# Patient Record
Sex: Female | Born: 1950 | Race: Black or African American | Hispanic: No | Marital: Married | Smoking: Never smoker
Health system: Southern US, Community
[De-identification: ages and names within clinical notes are randomized; demographics above are authoritative.]

## PROBLEM LIST (undated history)

## (undated) DIAGNOSIS — R002 Palpitations: Secondary | ICD-10-CM

## (undated) DIAGNOSIS — F410 Panic disorder [episodic paroxysmal anxiety] without agoraphobia: Secondary | ICD-10-CM

## (undated) DIAGNOSIS — H409 Unspecified glaucoma: Secondary | ICD-10-CM

## (undated) DIAGNOSIS — E785 Hyperlipidemia, unspecified: Secondary | ICD-10-CM

## (undated) DIAGNOSIS — I10 Essential (primary) hypertension: Secondary | ICD-10-CM

## (undated) DIAGNOSIS — D649 Anemia, unspecified: Secondary | ICD-10-CM

## (undated) DIAGNOSIS — K219 Gastro-esophageal reflux disease without esophagitis: Secondary | ICD-10-CM

## (undated) DIAGNOSIS — E119 Type 2 diabetes mellitus without complications: Secondary | ICD-10-CM

## (undated) DIAGNOSIS — G629 Polyneuropathy, unspecified: Secondary | ICD-10-CM

## (undated) DIAGNOSIS — R51 Headache: Secondary | ICD-10-CM

## (undated) DIAGNOSIS — R7303 Prediabetes: Secondary | ICD-10-CM

## (undated) DIAGNOSIS — R519 Headache, unspecified: Secondary | ICD-10-CM

## (undated) HISTORY — DX: Prediabetes: R73.03

## (undated) HISTORY — DX: Palpitations: R00.2

---

## 2002-06-05 DIAGNOSIS — B1710 Acute hepatitis C without hepatic coma: Secondary | ICD-10-CM | POA: Insufficient documentation

## 2002-09-03 DIAGNOSIS — G589 Mononeuropathy, unspecified: Secondary | ICD-10-CM | POA: Insufficient documentation

## 2003-11-06 ENCOUNTER — Other Ambulatory Visit: Payer: Self-pay

## 2004-04-07 ENCOUNTER — Encounter: Payer: Self-pay | Admitting: Orthopedic Surgery

## 2013-10-15 ENCOUNTER — Ambulatory Visit: Payer: Self-pay

## 2013-10-31 ENCOUNTER — Ambulatory Visit: Payer: Self-pay

## 2013-11-07 ENCOUNTER — Ambulatory Visit: Payer: Self-pay | Admitting: Family Medicine

## 2014-05-29 ENCOUNTER — Ambulatory Visit: Payer: Self-pay | Admitting: Family Medicine

## 2014-10-13 ENCOUNTER — Other Ambulatory Visit: Payer: Self-pay | Admitting: Family Medicine

## 2014-10-13 DIAGNOSIS — N63 Unspecified lump in unspecified breast: Secondary | ICD-10-CM

## 2014-10-19 ENCOUNTER — Other Ambulatory Visit: Payer: Self-pay | Admitting: Family Medicine

## 2014-10-19 DIAGNOSIS — N63 Unspecified lump in unspecified breast: Secondary | ICD-10-CM

## 2014-10-30 ENCOUNTER — Other Ambulatory Visit: Payer: Self-pay

## 2014-10-30 ENCOUNTER — Ambulatory Visit: Payer: Self-pay

## 2014-11-06 ENCOUNTER — Ambulatory Visit: Payer: Self-pay

## 2014-11-06 ENCOUNTER — Ambulatory Visit
Admission: RE | Admit: 2014-11-06 | Discharge: 2014-11-06 | Disposition: A | Discharge status: Home / Self Care | Payer: Medicaid Other | Source: Ambulatory Visit | Attending: Family Medicine | Admitting: Family Medicine

## 2014-11-06 DIAGNOSIS — N63 Unspecified lump in unspecified breast: Secondary | ICD-10-CM

## 2014-11-06 DIAGNOSIS — R928 Other abnormal and inconclusive findings on diagnostic imaging of breast: Secondary | ICD-10-CM | POA: Diagnosis not present

## 2015-08-26 ENCOUNTER — Ambulatory Visit: Payer: Medicaid Other | Admitting: Podiatry

## 2015-09-28 ENCOUNTER — Other Ambulatory Visit: Payer: Self-pay | Admitting: Family Medicine

## 2015-09-28 DIAGNOSIS — Z1231 Encounter for screening mammogram for malignant neoplasm of breast: Secondary | ICD-10-CM

## 2015-10-15 ENCOUNTER — Ambulatory Visit: Payer: Medicaid Other | Attending: Family Medicine

## 2015-10-29 ENCOUNTER — Ambulatory Visit: Payer: Medicaid Other | Attending: Family Medicine

## 2015-11-18 ENCOUNTER — Ambulatory Visit
Admission: RE | Admit: 2015-11-18 | Discharge: 2015-11-18 | Disposition: A | Discharge status: Home / Self Care | Payer: Medicaid Other | Source: Ambulatory Visit | Attending: Family Medicine | Admitting: Family Medicine

## 2015-11-18 DIAGNOSIS — Z1231 Encounter for screening mammogram for malignant neoplasm of breast: Secondary | ICD-10-CM | POA: Insufficient documentation

## 2016-07-27 ENCOUNTER — Ambulatory Visit (INDEPENDENT_AMBULATORY_CARE_PROVIDER_SITE_OTHER): Payer: Medicaid Other

## 2016-07-27 ENCOUNTER — Encounter: Payer: Self-pay | Admitting: Podiatry

## 2016-07-27 ENCOUNTER — Ambulatory Visit (INDEPENDENT_AMBULATORY_CARE_PROVIDER_SITE_OTHER): Payer: Medicaid Other | Admitting: Podiatry

## 2016-07-27 VITALS — BP 116/86 | HR 67 | Resp 18

## 2016-07-27 DIAGNOSIS — R52 Pain, unspecified: Secondary | ICD-10-CM

## 2016-07-27 DIAGNOSIS — M722 Plantar fascial fibromatosis: Secondary | ICD-10-CM

## 2016-07-27 DIAGNOSIS — R234 Changes in skin texture: Secondary | ICD-10-CM

## 2016-07-27 NOTE — Progress Notes (Signed)
   Subjective:    Patient ID: Stacy Cunningham, female    DOB: [birth year], 66 y.o.   MRN: [id number]  HPI this patient presents to the office with chief complaint of a cracking and bleeding from the back of her left heel for several years. She says that the cracking is worsening and she saw a blood.  She says that it was painful last week, but it has now improved with no bloody drainage.  She also says she has pain in the bottom of her heel on occasion that almost brings her to her feet. She says there is occasional pain occurs as she walks. She requests I evaluate her heel.    Review of Systems  HENT: Positive for ear pain and sinus pressure.        Ringing in ears  Cardiovascular: Positive for leg swelling.  Gastrointestinal: Positive for abdominal pain, blood in stool and diarrhea.  Musculoskeletal: Positive for back pain.       Joint pain  Skin: Positive for color change.  Neurological: Positive for numbness and headaches.       Objective:   Physical Exam GENERAL APPEARANCE: Alert, conversant. Appropriately groomed. No acute distress.  VASCULAR: Pedal pulses are  palpable at  [facility] and PT bilateral.  Capillary refill time is immediate to all digits,  Normal temperature gradient.  NEUROLOGIC: sensation is normal to 5.07 monofilament at 5/5 sites bilateral.  Light touch is intact bilateral, Muscle strength normal.  MUSCULOSKELETAL: acceptable muscle strength, tone and stability bilateral.  Intrinsic muscluature intact bilateral.  Rectus appearance of foot and digits noted bilateral. Palpable pain at insertion plantar fascia left foot.  DERMATOLOGIC: skin color, texture, and turgor are within normal limits.  No preulcerative lesions or ulcers  are seen, no interdigital maceration noted.  No open lesions present.  . No drainage noted. Dry scaly skin posterior plantar aspect left heel.  Healed fissure with dried hematoma noted.         Assessment & Plan:  Fissure left heel.  Plantar  fascitis left heel.  IE  Xray reveal no bony pathology.  Told her to consider powersteps insoles for heel pain.  Told her to vaseline and use chapstick for fissures as needed. RTC prn.   Gardiner Barefoot DPM

## 2016-10-10 ENCOUNTER — Other Ambulatory Visit: Payer: Self-pay | Admitting: Family Medicine

## 2016-10-10 DIAGNOSIS — Z1231 Encounter for screening mammogram for malignant neoplasm of breast: Secondary | ICD-10-CM

## 2016-11-21 ENCOUNTER — Ambulatory Visit
Admission: RE | Admit: 2016-11-21 | Discharge: 2016-11-21 | Disposition: A | Discharge status: Home / Self Care | Payer: Medicaid Other | Source: Ambulatory Visit | Attending: Family Medicine | Admitting: Family Medicine

## 2016-11-21 DIAGNOSIS — Z1231 Encounter for screening mammogram for malignant neoplasm of breast: Secondary | ICD-10-CM | POA: Diagnosis present

## 2017-01-04 DIAGNOSIS — S92919A Unspecified fracture of unspecified toe(s), initial encounter for closed fracture: Secondary | ICD-10-CM | POA: Insufficient documentation

## 2017-06-29 ENCOUNTER — Telehealth: Payer: Self-pay

## 2017-06-29 NOTE — Telephone Encounter (Signed)
Gastroenterology Pre-Procedure Review  Request Date: 07/13/17 Requesting Physician: Dr. Bonna Gains  PATIENT REVIEW QUESTIONS: The patient responded to the following health history questions as indicated:    1. Are you having any GI issues? No  2. Do you have a personal history of Polyps? No  3. Do you have a family history of Colon Cancer or Polyps? Yes, sister Colon Cancer 4. Diabetes Mellitus? Yes  5. Joint replacements in the past 12 months? No  6. Major health problems in the past 3 months? No  7. Any artificial heart valves, MVP, or defibrillator? No     MEDICATIONS & ALLERGIES:    Patient reports the following regarding taking any anticoagulation/antiplatelet therapy:   Plavix, Coumadin, Eliquis, Xarelto, Lovenox, Pradaxa, Brilinta, or Effient? No  Aspirin? Yes   Patient confirms/reports the following medications:  Current Outpatient Medications  Medication Sig Dispense Refill  . ferrous sulfate 325 (65 FE) MG tablet Take 325 mg by mouth daily with breakfast.    . fexofenadine (ALLEGRA) 180 MG tablet Take 180 mg by mouth daily.    . fluticasone (FLONASE) 50 MCG/ACT nasal spray Place into both nostrils daily.    . Gabapentin (NEURONTIN PO) Take 600 mg by mouth 3 (three) times daily.     . hydrochlorothiazide (HYDRODIURIL) 25 MG tablet Take 25 mg by mouth daily.    Marland Kitchen lisinopril (PRINIVIL,ZESTRIL) 5 MG tablet Take 5 mg by mouth daily.    . metFORMIN (GLUCOPHAGE) 500 MG tablet Take 500 mg by mouth. Takes 2 in am and takes 1 in pm    . simvastatin (ZOCOR) 20 MG tablet Take 20 mg by mouth daily.     No current facility-administered medications for this visit.     Patient confirms/reports the following allergies:  Allergies  Allergen Reactions  . Sulfa Antibiotics Dermatitis  . Darvon [Propoxyphene] Hives    No orders of the defined types were placed in this encounter.   AUTHORIZATION INFORMATION Primary Insurance: 1D#: Group #:  Secondary Insurance: 1D#: Group  #:  SCHEDULE INFORMATION: Date: 07/13/17 Time: Location: [facility]

## 2017-07-02 ENCOUNTER — Other Ambulatory Visit: Payer: Self-pay

## 2017-07-02 DIAGNOSIS — Z1211 Encounter for screening for malignant neoplasm of colon: Secondary | ICD-10-CM

## 2017-07-13 ENCOUNTER — Ambulatory Visit: Payer: Medicaid Other | Admitting: Anesthesiology

## 2017-07-13 ENCOUNTER — Encounter
Admission: RE | Disposition: A | Discharge status: Home / Self Care | Payer: Self-pay | Source: Ambulatory Visit | Attending: Gastroenterology

## 2017-07-13 ENCOUNTER — Ambulatory Visit
Admission: RE | Admit: 2017-07-13 | Discharge: 2017-07-13 | Disposition: A | Discharge status: Home / Self Care | Payer: Medicaid Other | Source: Ambulatory Visit | Attending: Gastroenterology | Admitting: Gastroenterology

## 2017-07-13 ENCOUNTER — Encounter: Payer: Self-pay | Admitting: *Deleted

## 2017-07-13 DIAGNOSIS — K5730 Diverticulosis of large intestine without perforation or abscess without bleeding: Secondary | ICD-10-CM | POA: Diagnosis not present

## 2017-07-13 DIAGNOSIS — Z882 Allergy status to sulfonamides status: Secondary | ICD-10-CM | POA: Diagnosis not present

## 2017-07-13 DIAGNOSIS — Z888 Allergy status to other drugs, medicaments and biological substances status: Secondary | ICD-10-CM | POA: Insufficient documentation

## 2017-07-13 DIAGNOSIS — Z7984 Long term (current) use of oral hypoglycemic drugs: Secondary | ICD-10-CM | POA: Diagnosis not present

## 2017-07-13 DIAGNOSIS — K219 Gastro-esophageal reflux disease without esophagitis: Secondary | ICD-10-CM | POA: Diagnosis not present

## 2017-07-13 DIAGNOSIS — I10 Essential (primary) hypertension: Secondary | ICD-10-CM | POA: Insufficient documentation

## 2017-07-13 DIAGNOSIS — Z1211 Encounter for screening for malignant neoplasm of colon: Secondary | ICD-10-CM | POA: Insufficient documentation

## 2017-07-13 DIAGNOSIS — E1140 Type 2 diabetes mellitus with diabetic neuropathy, unspecified: Secondary | ICD-10-CM | POA: Insufficient documentation

## 2017-07-13 DIAGNOSIS — Z79899 Other long term (current) drug therapy: Secondary | ICD-10-CM | POA: Insufficient documentation

## 2017-07-13 HISTORY — PX: COLONOSCOPY WITH PROPOFOL: SHX5780

## 2017-07-13 HISTORY — DX: Gastro-esophageal reflux disease without esophagitis: K21.9

## 2017-07-13 HISTORY — DX: Type 2 diabetes mellitus without complications: E11.9

## 2017-07-13 HISTORY — DX: Essential (primary) hypertension: I10

## 2017-07-13 HISTORY — DX: Polyneuropathy, unspecified: G62.9

## 2017-07-13 SURGERY — COLONOSCOPY WITH PROPOFOL
Anesthesia: General

## 2017-07-13 MED ORDER — PROPOFOL 10 MG/ML IV BOLUS
INTRAVENOUS | Status: DC | PRN
  Administered 2017-07-13: 70 mg via INTRAVENOUS

## 2017-07-13 MED ORDER — PROPOFOL 500 MG/50ML IV EMUL
INTRAVENOUS | Status: DC | PRN
  Administered 2017-07-13: 150 ug/kg/min via INTRAVENOUS

## 2017-07-13 MED ORDER — PROPOFOL 500 MG/50ML IV EMUL
INTRAVENOUS | Status: AC
  Filled 2017-07-13: qty 50

## 2017-07-13 MED ORDER — LIDOCAINE HCL (CARDIAC) 20 MG/ML IV SOLN
INTRAVENOUS | Status: DC | PRN
  Administered 2017-07-13: 40 mg via INTRAVENOUS

## 2017-07-13 MED ORDER — MIDAZOLAM HCL 2 MG/2ML IJ SOLN
INTRAMUSCULAR | Status: DC | PRN
  Administered 2017-07-13: 2 mg via INTRAVENOUS

## 2017-07-13 MED ORDER — SODIUM CHLORIDE 0.9 % IV SOLN
INTRAVENOUS | Status: DC
  Administered 2017-07-13: 09:00:00 via INTRAVENOUS

## 2017-07-13 MED ORDER — LIDOCAINE HCL (PF) 2 % IJ SOLN
INTRAMUSCULAR | Status: AC
  Filled 2017-07-13: qty 10

## 2017-07-13 MED ORDER — MIDAZOLAM HCL 2 MG/2ML IJ SOLN
INTRAMUSCULAR | Status: AC
  Filled 2017-07-13: qty 2

## 2017-07-13 MED ORDER — PHENYLEPHRINE HCL 10 MG/ML IJ SOLN
INTRAMUSCULAR | Status: DC | PRN
  Administered 2017-07-13 (×2): 100 ug via INTRAVENOUS
  Administered 2017-07-13 (×3): 50 ug via INTRAVENOUS

## 2017-07-13 NOTE — H&P (Signed)
[name], MD [address], [place], [place], [place], [address] [address], [place], [place], [place], [address] Phone: [phone]  Fax: [phone]  Primary Care Physician:  [facility]   Pre-Procedure History & Physical: HPI:  Stacy Cunningham is a 67 y.o. female is here for a colonoscopy.   Past Medical History:  Diagnosis Date  . Diabetes mellitus without complication ([facility])    Borderline Diabetes  . GERD (gastroesophageal reflux disease)   . Hypertension   . Neuropathy     History reviewed. No pertinent surgical history.  Prior to Admission medications   Medication Sig Start Date End Date Taking? Authorizing Provider  ferrous sulfate 325 (65 FE) MG tablet Take 325 mg by mouth daily with breakfast.   Yes [provider]  Gabapentin (NEURONTIN PO) Take 600 mg by mouth 3 (three) times daily.    Yes [provider]  hydrochlorothiazide (HYDRODIURIL) 25 MG tablet Take 25 mg by mouth daily.   Yes [provider]  lisinopril (PRINIVIL,ZESTRIL) 5 MG tablet Take 5 mg by mouth daily.   Yes [provider]  metFORMIN (GLUCOPHAGE) 500 MG tablet Take 500 mg by mouth. Takes 2 in am and takes 1 in pm 10/21/07  Yes [provider]  simvastatin (ZOCOR) 20 MG tablet Take 20 mg by mouth daily.   Yes [provider]  fexofenadine (ALLEGRA) 180 MG tablet Take 180 mg by mouth daily.    [provider]  fluticasone (FLONASE) 50 MCG/ACT nasal spray Place into both nostrils daily.    [provider]    Allergies as of 07/02/2017 - Review Complete 06/29/2017  Allergen Reaction Noted  . Sulfa antibiotics Dermatitis 07/27/2016  . Darvon [propoxyphene] Hives 06/17/2014    Family History  Problem Relation Age of Onset  . Breast cancer Sister 47    Social History   Socioeconomic History  . Marital status: Married    Spouse name: Not on file  . Number of children: Not on file  .  Years of education: Not on file  . Highest education level: Not on file  Occupational History  . Not on file  Social Needs  . Financial resource strain: Not on file  . Food insecurity:    Worry: Not on file    Inability: Not on file  . Transportation needs:    Medical: Not on file    Non-medical: Not on file  Tobacco Use  . Smoking status: Never Smoker  . Smokeless tobacco: Never Used  Substance and Sexual Activity  . Alcohol use: Yes    Comment: occas.  . Drug use: No  . Sexual activity: Not on file  Lifestyle  . Physical activity:    Days per week: Not on file    Minutes per session: Not on file  . Stress: Not on file  Relationships  . Social connections:    Talks on phone: Not on file    Gets together: Not on file    Attends religious service: Not on file    Active member of club or organization: Not on file    Attends meetings of clubs or organizations: Not on file    Relationship status: Not on file  . Intimate partner violence:    Fear of current or ex partner: Not on file    Emotionally abused: Not on file    Physically abused: Not on file    Forced sexual activity: Not on file  Other Topics Concern  .  Not on file  Social History Narrative  . Not on file    Review of Systems: See HPI, otherwise negative ROS  Physical Exam: BP 112/78   Pulse 80   Temp (!) 96.3 F (35.7 C) (Tympanic)   Resp 16   Ht 5\' 6"  (1.676 m)   Wt 185 lb (83.9 kg)   SpO2 100%   BMI 29.86 kg/m  General:   Alert,  pleasant and cooperative in NAD Head:  Normocephalic and atraumatic. Neck:  Supple; no masses or thyromegaly. Lungs:  Clear throughout to auscultation, normal respiratory effort.    Heart:  +S1, +S2, Regular rate and rhythm, No edema. Abdomen:  Soft, nontender and nondistended. Normal bowel sounds, without guarding, and without rebound.   Neurologic:  Alert and  oriented x4;  grossly normal neurologically.  Impression/Plan: Stacy Cunningham is here for a colonoscopy to  be performed for average risk screening.  Risks, benefits, limitations, and alternatives regarding  colonoscopy have been reviewed with the patient.  Questions have been answered.  All parties agreeable.   Virgel Manifold, MD  07/13/2017, 10:03 AM

## 2017-07-13 NOTE — Anesthesia Procedure Notes (Signed)
Date/Time: 07/13/2017 10:17 AM Performed by: Doreen Salvage, CRNA Pre-anesthesia Checklist: Patient identified, Emergency Drugs available, Suction available and Patient being monitored Patient Re-evaluated:Patient Re-evaluated prior to induction Oxygen Delivery Method: Nasal cannula Induction Type: IV induction Dental Injury: Teeth and Oropharynx as per pre-operative assessment  Comments: Nasal cannula with etCO2 monitoring

## 2017-07-13 NOTE — Op Note (Signed)
[facility] Gastroenterology Patient Name: Franchelle Foskett Procedure Date: 07/13/2017 10:17 AM MRN: [id number] Account #: [id number] Date of Birth: 06-08-1950 Admit Type: Outpatient Age: 67 Room: [facility] ENDO ROOM 2 Gender: Female Note Status: Finalized Procedure:            Colonoscopy Indications:          Screening for colorectal malignant neoplasm. Pt reports                        previous colonoscopy was 9-10 yrs ago and was normal.                        Report not available. Providers:            Lennette Bihari. Bonna Gains MD, MD Referring MD:         Dyke Maes. Mancheno Revelo (Referring MD) Medicines:            Monitored Anesthesia Care Complications:        No immediate complications. Procedure:            Pre-Anesthesia Assessment:                       - Prior to the procedure, a History and Physical was                        performed, and patient medications, allergies and                        sensitivities were reviewed. The patient's tolerance of                        previous anesthesia was reviewed.                       - The risks and benefits of the procedure and the                        sedation options and risks were discussed with the                        patient. All questions were answered and informed                        consent was obtained.                       - Patient identification and proposed procedure were                        verified prior to the procedure by the physician, the                        nurse, the anesthetist and the technician. The                        procedure was verified in the pre-procedure area in the                        procedure room in the endoscopy suite.                       -  ASA Grade Assessment: II - A patient with mild                        systemic disease.                       - After reviewing the risks and benefits, the patient                        was deemed in satisfactory  condition to undergo the                        procedure.                       After obtaining informed consent, the colonoscope was                        passed under direct vision. Throughout the procedure,                        the patient's blood pressure, pulse, and oxygen                        saturations were monitored continuously. The                        Colonoscope was introduced through the anus and                        advanced to the the cecum, identified by appendiceal                        orifice and ileocecal valve. The colonoscopy was                        performed with ease. The patient tolerated the                        procedure well. The quality of the bowel preparation                        was good. Findings:      The perianal and digital rectal examinations were normal.      Multiple diverticula were found in the sigmoid colon, descending colon       and ascending colon.      The exam was otherwise without abnormality.      The rectum, sigmoid colon, descending colon, transverse colon, ascending       colon and cecum appeared normal.      The retroflexed view of the distal rectum and anal verge was normal and       showed no anal or rectal abnormalities. Impression:           - Diverticulosis in the sigmoid colon, in the                        descending colon and in the ascending colon.                       - The examination was otherwise normal.                       -  The rectum, sigmoid colon, descending colon,                        transverse colon, ascending colon and cecum are normal.                       - The distal rectum and anal verge are normal on                        retroflexion view.                       - No specimens collected. Recommendation:       - Discharge patient to home.                       - Resume previous diet.                       - Continue present medications.                       - Repeat colonoscopy in  5-10 years.                       - Return to primary care physician as previously                        scheduled.                       - The findings and recommendations were discussed with                        the patient.                       - The findings and recommendations were discussed with                        the patient's family.                       - High fiber diet. Procedure Code(s):    --- Professional ---                       D9833, Colorectal cancer screening; colonoscopy on                        individual not meeting criteria for high risk Diagnosis Code(s):    --- Professional ---                       Z12.11, Encounter for screening for malignant neoplasm                        of colon                       K57.30, Diverticulosis of large intestine without                        perforation or abscess without bleeding CPT copyright 2016 American Medical Association. All rights reserved. The codes documented  in this report are preliminary and upon coder review may  be revised to meet current compliance requirements.  [name], MD Margretta Sidle B. Bonna Gains MD, MD 07/13/2017 11:03:33 AM This report has been signed electronically. Number of Addenda: 0 Note Initiated On: 07/13/2017 10:17 AM Scope Withdrawal Time: 0 hours 18 minutes 39 seconds  Total Procedure Duration: 0 hours 35 minutes 43 seconds       [facility]

## 2017-07-13 NOTE — Anesthesia Postprocedure Evaluation (Signed)
Anesthesia Post Note  Patient: Stacy Cunningham  Procedure(s) Performed: COLONOSCOPY WITH PROPOFOL (N/A )  Patient location during evaluation: Endoscopy Anesthesia Type: General Level of consciousness: awake and alert Pain management: pain level controlled Vital Signs Assessment: post-procedure vital signs reviewed and stable Respiratory status: spontaneous breathing, nonlabored ventilation, respiratory function stable and patient connected to nasal cannula oxygen Cardiovascular status: blood pressure returned to baseline and stable Postop Assessment: no apparent nausea or vomiting Anesthetic complications: no     Last Vitals:  Vitals Value Taken Time  BP    Temp    Pulse    Resp    SpO2      Last Pain:  Vitals:   07/13/17 1105  TempSrc: Tympanic  PainSc:                  Martha Clan

## 2017-07-13 NOTE — Anesthesia Post-op Follow-up Note (Signed)
Anesthesia QCDR form completed.        

## 2017-07-13 NOTE — Anesthesia Preprocedure Evaluation (Signed)
Anesthesia Evaluation  Patient identified by MRN, date of birth, ID band Patient awake    Reviewed: Allergy & Precautions, H&P , NPO status , Patient's Chart, lab work & pertinent test results, reviewed documented beta blocker date and time   History of Anesthesia Complications Negative for: history of anesthetic complications  Airway Mallampati: I  TM Distance: >3 FB Neck ROM: full    Dental  (+) Partial Upper, Dental Advidsory Given   Pulmonary neg pulmonary ROS,           Cardiovascular Exercise Tolerance: Good hypertension, (-) angina(-) CAD, (-) Past MI, (-) Cardiac Stents and (-) CABG (-) dysrhythmias (-) Valvular Problems/Murmurs     Neuro/Psych negative neurological ROS  negative psych ROS   GI/Hepatic Neg liver ROS, GERD  ,  Endo/Other  diabetes  Renal/GU negative Renal ROS  negative genitourinary   Musculoskeletal   Abdominal   Peds  Hematology negative hematology ROS (+)   Anesthesia Other Findings Past Medical History: No date: Diabetes mellitus without complication ([facility])     Comment:  Borderline Diabetes No date: GERD (gastroesophageal reflux disease) No date: Hypertension No date: Neuropathy   Reproductive/Obstetrics negative OB ROS                             Anesthesia Physical Anesthesia Plan  ASA: II  Anesthesia Plan: General   Post-op Pain Management:    Induction: Intravenous  PONV Risk Score and Plan: 3 and Propofol infusion  Airway Management Planned: Natural Airway and Nasal Cannula  Additional Equipment:   Intra-op Plan:   Post-operative Plan:   Informed Consent: I have reviewed the patients History and Physical, chart, labs and discussed the procedure including the risks, benefits and alternatives for the proposed anesthesia with the patient or authorized representative who has indicated his/her understanding and acceptance.   Dental Advisory  Given  Plan Discussed with: Anesthesiologist, CRNA and Surgeon  Anesthesia Plan Comments:         Anesthesia Quick Evaluation

## 2017-07-13 NOTE — Transfer of Care (Signed)
Immediate Anesthesia Transfer of Care Note  Patient: Evetta Renner  Procedure(s) Performed: Procedure(s): COLONOSCOPY WITH PROPOFOL (N/A)  Patient Location: PACU and Endoscopy Unit  Anesthesia Type:General  Level of Consciousness: sedated  Airway & Oxygen Therapy: Patient Spontanous Breathing and Patient connected to nasal cannula oxygen  Post-op Assessment: Report given to RN and Post -op Vital signs reviewed and stable  Post vital signs: Reviewed and stable  Last Vitals:  Vitals:   07/13/17 1100 07/13/17 1105  BP: 95/63 95/63  Pulse: 80 67  Resp: 17 14  Temp: (!) 35.8 C (!) 36.2 C  SpO2: 112% 162%    Complications: No apparent anesthesia complications

## 2017-07-16 ENCOUNTER — Encounter: Payer: Self-pay | Admitting: Gastroenterology

## 2017-09-06 ENCOUNTER — Encounter: Payer: Self-pay | Admitting: Gastroenterology

## 2017-09-06 ENCOUNTER — Ambulatory Visit: Payer: Medicaid Other | Admitting: Gastroenterology

## 2017-09-06 ENCOUNTER — Encounter: Payer: Self-pay | Admitting: Family Medicine

## 2017-09-06 ENCOUNTER — Encounter (INDEPENDENT_AMBULATORY_CARE_PROVIDER_SITE_OTHER): Payer: Self-pay

## 2017-09-06 VITALS — BP 130/84 | HR 76 | Ht 66.0 in | Wt 190.6 lb

## 2017-09-06 DIAGNOSIS — R1013 Epigastric pain: Secondary | ICD-10-CM

## 2017-09-06 NOTE — Progress Notes (Addendum)
Stacy Cunningham [address]  [place]  [place], [place] [address]  Main: [phone]  Fax: [phone]   Gastroenterology Consultation  Referring Provider:     [name]* Primary Care Physician:  [facility] Primary Gastroenterologist:  Dr. Vonda Cunningham Reason for Consultation:     Epigastric pain        HPI:   Chief complaint: Abdominal pain  Stacy Cunningham is a 67 y.o. y/o female referred for consultation & management  by Dr. Domingo Madeira, [facility].    Reports epigastric abdominal pain that started about 2 weeks ago, intermittent, does not occur with meals, occurs randomly, dull, 6/10 today, lasts for hours, and self resolves.  She was given Protonix by her primary care provider, that she takes once daily and this has not helped her symptoms.  This is not associated with heartburn.  No radiation.  No weight loss.  No nausea or vomiting.  Patient reports history of gastric ulcer back in 1980s.  She does not recall how this was diagnosed, but thinks she went to [facility] at that time.  She reports having abdominal pain and nausea vomiting at the time and states she was found to have gastric ulcers.  Does not recall if she actually had an EGD at the time.    Reports taking ibuprofen 2-3 times a week, which she stopped about 1 to 2 weeks ago, after her primary care provider asked her to do so.  Patient underwent screening colonoscopy on March 2019, that showed diverticulosis.  Otherwise exam was normal.  Past Medical History:  Diagnosis Date  . Diabetes mellitus without complication ([place])    Borderline Diabetes  . GERD (gastroesophageal reflux disease)   . Hypertension   . Neuropathy     Past Surgical History:  Procedure Laterality Date  . COLONOSCOPY WITH PROPOFOL N/A 07/13/2017   Procedure: COLONOSCOPY WITH PROPOFOL;  Surgeon: Virgel Manifold, MD;  Location: [facility] ENDOSCOPY;  Service: Endoscopy;   Laterality: N/A;    Prior to Admission medications   Medication Sig Start Date End Date Taking? Authorizing Provider  ferrous sulfate 325 (65 FE) MG tablet Take 325 mg by mouth daily with breakfast.    [provider]  fexofenadine (ALLEGRA) 180 MG tablet Take 180 mg by mouth daily.    [provider]  fluticasone (FLONASE) 50 MCG/ACT nasal spray Place into both nostrils daily.    [provider]  Gabapentin (NEURONTIN PO) Take 600 mg by mouth 3 (three) times daily.     [provider]  GABAPENTIN PO gabapentin    [provider]  hydrochlorothiazide (HYDRODIURIL) 25 MG tablet Take 25 mg by mouth daily.    [provider]  ibuprofen (ADVIL,MOTRIN) 800 MG tablet ibuprofen 800 mg tablet  Take 1 tablet 3 times a day by oral route.    [provider]  IRON PO iron    [provider]  lisinopril (PRINIVIL,ZESTRIL) 5 MG tablet Take 5 mg by mouth daily.    [provider]  LISINOPRIL PO lisinopril    [provider]  metFORMIN (GLUCOPHAGE) 500 MG tablet Take 500 mg by mouth. Takes 2 in am and takes 1 in pm 10/21/07   [provider]  METFORMIN HCL PO metformin    [provider]  simvastatin (ZOCOR) 20 MG tablet Take 20 mg by mouth daily.    [provider]  SIMVASTATIN PO simvastatin    [provider]    Family History  Problem Relation Age of Onset  . Breast cancer Sister 63     Social History   Tobacco Use  . Smoking status: Never Smoker  . Smokeless tobacco: Never Used  Substance Use Topics  . Alcohol use: Yes    Comment: occas.  . Drug use: No    Allergies as of 09/06/2017 - Review Complete 07/13/2017  Allergen Reaction Noted  . Sulfa antibiotics Dermatitis 07/27/2016  . Darvon [propoxyphene] Hives 06/17/2014    Review of Systems:    All systems reviewed and negative except where noted in HPI.   Physical Exam:  No LMP recorded. Patient is  postmenopausal.  Vitals:   09/06/17 1421  BP: 130/84  Pulse: 76  Weight: 190 lb 9.6 oz (86.5 kg)  Height: 5\' 6"  (1.676 m)    Psych:  Alert and cooperative. Normal mood and affect. General:   Alert,  Well-developed, well-nourished, pleasant and cooperative in NAD Head:  Normocephalic and atraumatic. Eyes:  Sclera clear, no icterus.   Conjunctiva pink. Ears:  Normal auditory acuity. Nose:  No deformity, discharge, or lesions. Mouth:  No deformity or lesions,oropharynx pink & moist. Neck:  Supple; no masses or thyromegaly. Lungs:  Respirations even and unlabored.  Clear throughout to auscultation.   No wheezes, crackles, or rhonchi. No acute distress. Heart:  Regular rate and rhythm; no murmurs, clicks, rubs, or gallops. Abdomen:  Normal bowel sounds.  No bruits.  Soft, non-tender and non-distended without masses, hepatosplenomegaly or hernias noted.  No guarding or rebound tenderness.    Msk:  Symmetrical without gross deformities. Good, equal movement & strength bilaterally. Pulses:  Normal pulses noted. Extremities:  No clubbing or edema.  No cyanosis. Neurologic:  Alert and oriented x3;  grossly normal neurologically. Skin:  Intact without significant lesions or rashes. No jaundice. Lymph Nodes:  No significant cervical adenopathy. Psych:  Alert and cooperative. Normal mood and affect.   Labs: CBC No results found for: WBC, RBC, HGB, HCT, PLT, MCV, MCH, MCHC, RDW, LYMPHSABS, MONOABS, EOSABS, BASOSABS CMP  No results found for: NA, K, CL, CO2, GLUCOSE, BUN, CREATININE, CALCIUM, PROT, ALBUMIN, AST, ALT, ALKPHOS, BILITOT, GFRNONAA, GFRAA  Imaging Studies: No results found.  Assessment and Plan:   Sira Adsit is a 67 y.o. y/o female has been referred for epigastric abdominal pain for 2 weeks, unrelieved with PPI, with patient reported history of ?gastric ulcers in the 1980s with frequent ibuprofen use until a few weeks ago  Due to previous history of gastric ulcer, new  abdominal pain unrelieved with PPI, and recent NSAID use, EGD is indicated to rule out gastric ulcers and evaluate etiology of symptoms Patient to continue to avoid NSAIDs Continue PPI for now, which can be changed after procedure if needed No dysphagia present I have discussed alternative options, risks & benefits,  which include, but are not limited to, bleeding, infection, perforation,respiratory complication & drug reaction.  The patient agrees with this plan & written consent will be obtained.    Her primary care clinic note reviewed, reports history of hepatitis C.  Patient states she was initially told in the 90s about hepatitis C, but she was never treated for it.  She may have spontaneously cleared at, or it is unclear if she ever had it.  She states her primary care physician recently checked her for it and she does not have it.  We will try to obtain her liver enzymes, and hep C testing done by  her primary care provider for our records. Addendum: Hep C RNA by PCR, undetectable on April 2018 lab record obtained  Colonoscopy up-to-date  Dr Stacy Cunningham

## 2017-09-14 ENCOUNTER — Ambulatory Visit: Payer: Medicaid Other | Admitting: Certified Registered"

## 2017-09-14 ENCOUNTER — Encounter: Payer: Self-pay | Admitting: Emergency Medicine

## 2017-09-14 ENCOUNTER — Ambulatory Visit
Admission: RE | Admit: 2017-09-14 | Discharge: 2017-09-14 | Disposition: A | Discharge status: Home / Self Care | Payer: Medicaid Other | Source: Ambulatory Visit | Attending: Gastroenterology | Admitting: Gastroenterology

## 2017-09-14 ENCOUNTER — Encounter
Admission: RE | Disposition: A | Discharge status: Home / Self Care | Payer: Self-pay | Source: Ambulatory Visit | Attending: Gastroenterology

## 2017-09-14 DIAGNOSIS — Z882 Allergy status to sulfonamides status: Secondary | ICD-10-CM | POA: Insufficient documentation

## 2017-09-14 DIAGNOSIS — K2950 Unspecified chronic gastritis without bleeding: Secondary | ICD-10-CM | POA: Diagnosis not present

## 2017-09-14 DIAGNOSIS — Z7984 Long term (current) use of oral hypoglycemic drugs: Secondary | ICD-10-CM | POA: Insufficient documentation

## 2017-09-14 DIAGNOSIS — K3189 Other diseases of stomach and duodenum: Secondary | ICD-10-CM

## 2017-09-14 DIAGNOSIS — Z888 Allergy status to other drugs, medicaments and biological substances status: Secondary | ICD-10-CM | POA: Diagnosis not present

## 2017-09-14 DIAGNOSIS — R1013 Epigastric pain: Secondary | ICD-10-CM

## 2017-09-14 DIAGNOSIS — K219 Gastro-esophageal reflux disease without esophagitis: Secondary | ICD-10-CM | POA: Diagnosis not present

## 2017-09-14 DIAGNOSIS — I10 Essential (primary) hypertension: Secondary | ICD-10-CM | POA: Diagnosis not present

## 2017-09-14 DIAGNOSIS — E1140 Type 2 diabetes mellitus with diabetic neuropathy, unspecified: Secondary | ICD-10-CM | POA: Insufficient documentation

## 2017-09-14 DIAGNOSIS — Z79899 Other long term (current) drug therapy: Secondary | ICD-10-CM | POA: Insufficient documentation

## 2017-09-14 HISTORY — PX: ESOPHAGOGASTRODUODENOSCOPY (EGD) WITH PROPOFOL: SHX5813

## 2017-09-14 LAB — GLUCOSE, CAPILLARY: GLUCOSE-CAPILLARY: 107 mg/dL — AB (ref 65–99)

## 2017-09-14 SURGERY — ESOPHAGOGASTRODUODENOSCOPY (EGD) WITH PROPOFOL
Anesthesia: General

## 2017-09-14 MED ORDER — PROPOFOL 10 MG/ML IV BOLUS
INTRAVENOUS | Status: DC | PRN
  Administered 2017-09-14: 70 mg via INTRAVENOUS

## 2017-09-14 MED ORDER — SODIUM CHLORIDE 0.9 % IV SOLN: INTRAVENOUS | Status: DC

## 2017-09-14 MED ORDER — PROPOFOL 500 MG/50ML IV EMUL
INTRAVENOUS | Status: DC | PRN
Start: 2017-09-14 — End: 2017-09-14
  Administered 2017-09-14: 130 ug/kg/min via INTRAVENOUS

## 2017-09-14 MED ORDER — LIDOCAINE HCL (CARDIAC) PF 100 MG/5ML IV SOSY
PREFILLED_SYRINGE | INTRAVENOUS | Status: DC | PRN
  Administered 2017-09-14: 100 mg via INTRAVENOUS

## 2017-09-14 MED ORDER — PROPOFOL 10 MG/ML IV BOLUS
INTRAVENOUS | Status: AC
  Filled 2017-09-14: qty 20

## 2017-09-14 MED ORDER — MIDAZOLAM HCL 2 MG/2ML IJ SOLN
INTRAMUSCULAR | Status: DC | PRN
  Administered 2017-09-14: 2 mg via INTRAVENOUS

## 2017-09-14 MED ORDER — MIDAZOLAM HCL 2 MG/2ML IJ SOLN
INTRAMUSCULAR | Status: AC
  Filled 2017-09-14: qty 2

## 2017-09-14 MED ORDER — LIDOCAINE HCL (PF) 2 % IJ SOLN
INTRAMUSCULAR | Status: AC
  Filled 2017-09-14: qty 10

## 2017-09-14 MED ORDER — SODIUM CHLORIDE 0.9 % IV SOLN
INTRAVENOUS | Status: DC
  Administered 2017-09-14: 1000 mL via INTRAVENOUS

## 2017-09-14 NOTE — Anesthesia Preprocedure Evaluation (Signed)
Anesthesia Evaluation  Patient identified by MRN, date of birth, ID band Patient awake    Reviewed: Allergy & Precautions, NPO status , Patient's Chart, lab work & pertinent test results  History of Anesthesia Complications Negative for: history of anesthetic complications  Airway Mallampati: III       Dental  (+) Missing, Poor Dentition, Chipped   Pulmonary neg sleep apnea, neg COPD,           Cardiovascular hypertension, Pt. on medications (-) Past MI and (-) CHF (-) dysrhythmias (-) Valvular Problems/Murmurs     Neuro/Psych neg Seizures    GI/Hepatic GERD  Medicated,(+) Hepatitis -, C  Endo/Other  diabetes, Type 2, Oral Hypoglycemic Agents  Renal/GU negative Renal ROS     Musculoskeletal   Abdominal   Peds  Hematology   Anesthesia Other Findings   Reproductive/Obstetrics                             Anesthesia Physical Anesthesia Plan  ASA: II  Anesthesia Plan: General   Post-op Pain Management:    Induction: Intravenous  PONV Risk Score and Plan: 3 and TIVA, Propofol infusion and Treatment may vary due to age or medical condition  Airway Management Planned: Simple Face Mask  Additional Equipment:   Intra-op Plan:   Post-operative Plan:   Informed Consent: I have reviewed the patients History and Physical, chart, labs and discussed the procedure including the risks, benefits and alternatives for the proposed anesthesia with the patient or authorized representative who has indicated his/her understanding and acceptance.     Plan Discussed with:   Anesthesia Plan Comments:         Anesthesia Quick Evaluation

## 2017-09-14 NOTE — Op Note (Signed)
[facility] Gastroenterology Patient Name: Stacy Cunningham Procedure Date: 09/14/2017 9:20 AM MRN: [id number] Account #: [id number] Date of Birth: April 12, 1951 Admit Type: Outpatient Age: 67 Room: [facility] ENDO ROOM 2 Gender: Female Note Status: Finalized Procedure:            Upper GI endoscopy Indications:          Epigastric abdominal pain Providers:            Taisley Mordan B. Bonna Gains MD, MD Referring MD:         Dyke Maes. Mancheno Revelo (Referring MD) Medicines:            Monitored Anesthesia Care Complications:        No immediate complications. Procedure:            Pre-Anesthesia Assessment:                       - Prior to the procedure, a History and Physical was                        performed, and patient medications, allergies and                        sensitivities were reviewed. The patient's tolerance of                        previous anesthesia was reviewed.                       - The risks and benefits of the procedure and the                        sedation options and risks were discussed with the                        patient. All questions were answered and informed                        consent was obtained.                       - Patient identification and proposed procedure were                        verified prior to the procedure by the physician, the                        nurse, the anesthesiologist, the anesthetist and the                        technician. The procedure was verified in the procedure                        room.                       - ASA Grade Assessment: II - A patient with mild                        systemic disease.  After obtaining informed consent, the endoscope was                        passed under direct vision. Throughout the procedure,                        the patient's blood pressure, pulse, and oxygen                        saturations were monitored continuously. The Endoscope                         was introduced through the mouth, and advanced to the                        second part of duodenum. The upper GI endoscopy was                        accomplished with ease. The patient tolerated the                        procedure well. Findings:      The examined esophagus was normal.      The Z-line was regular and was found 38 cm from the incisors.      Patchy mildly erythematous mucosa without bleeding was found in the       gastric antrum. Biopsies were taken with a cold forceps for histology.       Biopsies were obtained in the gastric body, at the incisura and in the       gastric antrum with cold forceps for histology.      The duodenal bulb, second portion of the duodenum and examined duodenum       were normal. Impression:           - Normal esophagus.                       - Z-line regular, 38 cm from the incisors.                       - Erythematous mucosa in the antrum. Biopsied.                       - Normal duodenal bulb, second portion of the duodenum                        and examined duodenum.                       - Biopsies were obtained in the gastric body, at the                        incisura and in the gastric antrum. Recommendation:       - Await pathology results.                       - Discharge patient to home (with escort).                       - Advance diet as tolerated.                       -  Continue present medications.                       - Patient has a contact number available for                        emergencies. The signs and symptoms of potential                        delayed complications were discussed with the patient.                        Return to normal activities tomorrow. Written discharge                        instructions were provided to the patient.                       - Discharge patient to home (with escort).                       - The findings and recommendations were discussed with                         the patient.                       - The findings and recommendations were discussed with                        the patient's family. Procedure Code(s):    --- Professional ---                       [phone], Esophagogastroduodenoscopy, flexible, transoral;                        with biopsy, single or multiple Diagnosis Code(s):    --- Professional ---                       K31.89, Other diseases of stomach and duodenum                       R10.13, Epigastric pain CPT copyright 2017 American Medical Association. All rights reserved. The codes documented in this report are preliminary and upon coder review may  be revised to meet current compliance requirements.  [name], MD Margretta Sidle B. Bonna Gains MD, MD 09/14/2017 9:38:01 AM This report has been signed electronically. Number of Addenda: 0 Note Initiated On: 09/14/2017 9:20 AM Estimated Blood Loss: Estimated blood loss: none.      [facility]

## 2017-09-14 NOTE — Anesthesia Post-op Follow-up Note (Signed)
Anesthesia QCDR form completed.        

## 2017-09-14 NOTE — Anesthesia Procedure Notes (Signed)
Performed by: Renso Swett, CRNA Pre-anesthesia Checklist: Patient identified, Emergency Drugs available, Suction available, Patient being monitored and Timeout performed Patient Re-evaluated:Patient Re-evaluated prior to induction Oxygen Delivery Method: Nasal cannula Induction Type: IV induction       

## 2017-09-14 NOTE — Anesthesia Postprocedure Evaluation (Signed)
Anesthesia Post Note  Patient: Stacy Cunningham  Procedure(s) Performed: ESOPHAGOGASTRODUODENOSCOPY (EGD) WITH PROPOFOL (N/A )  Patient location during evaluation: Endoscopy Anesthesia Type: General Level of consciousness: awake and alert Pain management: pain level controlled Vital Signs Assessment: post-procedure vital signs reviewed and stable Respiratory status: spontaneous breathing and respiratory function stable Cardiovascular status: stable Anesthetic complications: no     Last Vitals:  Vitals:   09/14/17 0950 09/14/17 1000  BP: 111/78 111/79  Pulse: 72 73  Resp: 14 15  Temp:    SpO2: 96% 96%    Last Pain:  Vitals:   09/14/17 1000  TempSrc:   PainSc: 0-No pain                 KEPHART,WILLIAM K

## 2017-09-14 NOTE — H&P (Signed)
[name], MD [address] [place], [place], [place], [address] [address], [place], [place], [place], [address] Phone: [phone]  Fax: [phone]  Primary Care Physician:  [name], MD   Pre-Procedure History & Physical: HPI:  Stacy Cunningham is a 67 y.o. female is here for an EGD.   Past Medical History:  Diagnosis Date  . Diabetes mellitus without complication ([place])    Borderline Diabetes  . GERD (gastroesophageal reflux disease)   . Hypertension   . Neuropathy   . Neuropathy     Past Surgical History:  Procedure Laterality Date  . COLONOSCOPY WITH PROPOFOL N/A 07/13/2017   Procedure: COLONOSCOPY WITH PROPOFOL;  Surgeon: Virgel Manifold, MD;  Location: [facility] ENDOSCOPY;  Service: Endoscopy;  Laterality: N/A;    Prior to Admission medications   Medication Sig Start Date End Date Taking? Authorizing Provider  ferrous sulfate 325 (65 FE) MG tablet Take 325 mg by mouth daily with breakfast.   Yes [provider]  Gabapentin (NEURONTIN PO) Take 600 mg by mouth 3 (three) times daily.    Yes [provider]  hydrochlorothiazide (HYDRODIURIL) 25 MG tablet Take 25 mg by mouth daily.   Yes [provider]  ibuprofen (ADVIL,MOTRIN) 800 MG tablet ibuprofen 800 mg tablet  Take 1 tablet 3 times a day by oral route.   Yes [provider]  IRON PO iron   Yes [provider]  lisinopril (PRINIVIL,ZESTRIL) 5 MG tablet Take 5 mg by mouth daily.   Yes [provider]  LISINOPRIL PO lisinopril   Yes [provider]  metFORMIN (GLUCOPHAGE) 500 MG tablet Take 500 mg by mouth. Takes 2 in am and takes 1 in pm 10/21/07  Yes [provider]  METFORMIN HCL PO metformin   Yes [provider]  simvastatin (ZOCOR) 20 MG tablet Take 20 mg by mouth daily.   Yes [provider]  SIMVASTATIN PO simvastatin   Yes [provider]  GABAPENTIN PO gabapentin    [provider]  pantoprazole (PROTONIX) 40 MG tablet Take 40 mg by mouth daily.    [provider]    Allergies as of 09/06/2017 - Review Complete 09/06/2017  Allergen Reaction Noted  . Sulfa antibiotics Dermatitis 07/27/2016  . Darvon [propoxyphene] Hives 06/17/2014    Family History  Problem Relation Age of Onset  . Breast cancer Sister 78    Social History   Socioeconomic History  . Marital status: Married    Spouse name: Not on file  . Number of children: Not on file  . Years of education: Not on file  . Highest education level: Not on file  Occupational History  . Not on file  Social Needs  . Financial resource strain: Not on file  . Food insecurity:    Worry: Not on file    Inability: Not on file  . Transportation needs:    Medical: Not on file    Non-medical: Not on file  Tobacco Use  . Smoking status: Never Smoker  . Smokeless tobacco: Never Used  Substance and Sexual Activity  . Alcohol use: Not Currently    Comment: occas.  . Drug use: No  . Sexual activity: Yes  Lifestyle  . Physical activity:    Days per week: Not on file    Minutes per session: Not on file  . Stress: Not on file  Relationships  . Social connections:    Talks on phone: Not on file  Gets together: Not on file    Attends religious service: Not on file    Active member of club or organization: Not on file    Attends meetings of clubs or organizations: Not on file    Relationship status: Not on file  . Intimate partner violence:    Fear of current or ex partner: Not on file    Emotionally abused: Not on file    Physically abused: Not on file    Forced sexual activity: Not on file  Other Topics Concern  . Not on file  Social History Narrative  . Not on file    Review of Systems: See HPI, otherwise negative ROS  Physical Exam: BP (!) 143/92   Pulse 70   Temp (!) 95.9 F (35.5 C) (Tympanic)   Resp 16   Ht 5\' 6"  (1.676 m)   Wt 191 lb (86.6 kg)   SpO2 100%    BMI 30.83 kg/m  General:   Alert,  pleasant and cooperative in NAD Head:  Normocephalic and atraumatic. Neck:  Supple; no masses or thyromegaly. Lungs:  Clear throughout to auscultation, normal respiratory effort.    Heart:  +S1, +S2, Regular rate and rhythm, No edema. Abdomen:  Soft, nontender and nondistended. Normal bowel sounds, without guarding, and without rebound.   Neurologic:  Alert and  oriented x4;  grossly normal neurologically.  Impression/Plan: Stacy Cunningham is here for an EGD for Abdominal pain and history of ulcers.  Risks, benefits, limitations, and alternatives regarding the procedure have been reviewed with the patient.  Questions have been answered.  All parties agreeable.   Virgel Manifold, MD  09/14/2017, 9:16 AM

## 2017-09-14 NOTE — Transfer of Care (Signed)
Immediate Anesthesia Transfer of Care Note  Patient: Stacy Cunningham  Procedure(s) Performed: ESOPHAGOGASTRODUODENOSCOPY (EGD) WITH PROPOFOL (N/A )  Patient Location: PACU  Anesthesia Type:General  Level of Consciousness: sedated  Airway & Oxygen Therapy: Patient Spontanous Breathing and Patient connected to nasal cannula oxygen  Post-op Assessment: Report given to RN and Post -op Vital signs reviewed and stable  Post vital signs: Reviewed and stable  Last Vitals:  Vitals Value Taken Time  BP 109/70 09/14/2017  9:41 AM  Temp    Pulse 75 09/14/2017  9:41 AM  Resp 13 09/14/2017  9:41 AM  SpO2 99 % 09/14/2017  9:41 AM    Last Pain:  Vitals:   09/14/17 0847  TempSrc: Tympanic  PainSc: 0-No pain         Complications: No apparent anesthesia complications

## 2017-09-18 ENCOUNTER — Encounter: Payer: Self-pay | Admitting: Gastroenterology

## 2017-09-18 LAB — SURGICAL PATHOLOGY

## 2017-10-02 ENCOUNTER — Telehealth: Payer: Self-pay | Admitting: Gastroenterology

## 2017-10-02 ENCOUNTER — Encounter: Payer: Self-pay | Admitting: Family Medicine

## 2017-10-02 NOTE — Telephone Encounter (Signed)
Pt needs refill on rx Pantoprazole sodium 40 mg send to [facility] on [address] please call pt once rx is send

## 2017-10-03 MED ORDER — PANTOPRAZOLE SODIUM 40 MG PO TBEC: 40.0000 mg | DELAYED_RELEASE_TABLET | Freq: Every day | ORAL | 1 refills | Status: DC

## 2017-10-03 NOTE — Addendum Note (Signed)
Addended by: Earl Lagos on: 10/03/2017 12:09 PM   Modules accepted: Orders

## 2017-10-03 NOTE — Telephone Encounter (Signed)
Pt notified rx was sent in.

## 2017-10-18 ENCOUNTER — Other Ambulatory Visit: Payer: Self-pay | Admitting: Family Medicine

## 2017-10-18 DIAGNOSIS — Z1231 Encounter for screening mammogram for malignant neoplasm of breast: Secondary | ICD-10-CM

## 2017-11-22 ENCOUNTER — Ambulatory Visit
Admission: RE | Admit: 2017-11-22 | Discharge: 2017-11-22 | Disposition: A | Discharge status: Home / Self Care | Payer: Medicaid Other | Source: Ambulatory Visit | Attending: Family Medicine | Admitting: Family Medicine

## 2017-11-22 DIAGNOSIS — Z1231 Encounter for screening mammogram for malignant neoplasm of breast: Secondary | ICD-10-CM | POA: Diagnosis not present

## 2017-12-11 ENCOUNTER — Encounter: Payer: Self-pay | Admitting: Gastroenterology

## 2017-12-11 ENCOUNTER — Ambulatory Visit: Payer: Medicaid Other | Admitting: Gastroenterology

## 2017-12-11 VITALS — BP 112/72 | Ht 66.0 in | Wt 187.6 lb

## 2017-12-11 DIAGNOSIS — R1084 Generalized abdominal pain: Secondary | ICD-10-CM

## 2017-12-11 MED ORDER — RANITIDINE HCL 150 MG PO TABS: 150.0000 mg | ORAL_TABLET | Freq: Every day | ORAL | 0 refills | Status: DC

## 2017-12-11 NOTE — Progress Notes (Signed)
[name], MD [address]  [place]  [place],  [address]  Main: [phone]  Fax: [phone]   Primary Care Physician: [name], MD  Primary Gastroenterologist:  Dr. [name]  Chief Complaint  Patient presents with  . Hospitalization Follow-up    EGD, ABDOMINAL PAIN HX OF ULCERS    HPI: Stacy Cunningham is a 67 y.o. female presents for follow-up of her abdominal pain is completely resolved.  No nausea or vomiting, dysphagia, weight loss, altered bowel habits.  No blood in stool or melena.  Patient took Protonix for 2 months now and abdominal pain is completely resolved.  She is also been staying off NSAIDs, as her PCP discontinued it.  Ran out of Protonix 1 week ago and pain has not returned.  EGD completed in May 2019 and showed erythematous mucosa in the antrum.  Biopsy showed reactive gastritis, negative for H. pylori.  Current Outpatient Medications  Medication Sig Dispense Refill  . ferrous sulfate 325 (65 FE) MG tablet Take 325 mg by mouth daily with breakfast.    . Gabapentin (NEURONTIN PO) Take 600 mg by mouth 3 (three) times daily.     . hydrochlorothiazide (HYDRODIURIL) 25 MG tablet Take 25 mg by mouth daily.    . IRON PO iron    . lisinopril (PRINIVIL,ZESTRIL) 5 MG tablet Take 5 mg by mouth daily.    Marland Kitchen LISINOPRIL PO lisinopril    . metFORMIN (GLUCOPHAGE) 500 MG tablet Take 500 mg by mouth. Takes 2 in am and takes 1 in pm    . pantoprazole (PROTONIX) 40 MG tablet Take 1 tablet (40 mg total) by mouth daily. 30 tablet 1  . SIMVASTATIN PO simvastatin    . ibuprofen (ADVIL,MOTRIN) 800 MG tablet ibuprofen 800 mg tablet  Take 1 tablet 3 times a day by oral route.     No current facility-administered medications for this visit.     Allergies as of 12/11/2017 - Review Complete 12/11/2017  Allergen Reaction Noted  . Sulfa antibiotics Dermatitis 07/27/2016  . Darvon [propoxyphene] Hives 06/17/2014    ROS:  General:  Negative for anorexia, weight loss, fever, chills, fatigue, weakness. ENT: Negative for hoarseness, difficulty swallowing , nasal congestion. CV: Negative for chest pain, angina, palpitations, dyspnea on exertion, peripheral edema.  Respiratory: Negative for dyspnea at rest, dyspnea on exertion, cough, sputum, wheezing.  GI: See history of present illness. GU:  Negative for dysuria, hematuria, urinary incontinence, urinary frequency, nocturnal urination.  Endo: Negative for unusual weight change.    Physical Examination:   BP 112/72   Ht 5\' 6"  (1.676 m)   Wt 187 lb 9.6 oz (85.1 kg)   BMI 30.28 kg/m   General: Well-nourished, well-developed in no acute distress.  Eyes: No icterus. Conjunctivae pink. Mouth: Oropharyngeal mucosa moist and pink , no lesions erythema or exudate. Neck: Supple, Trachea midline Abdomen: Bowel sounds are normal, nontender, nondistended, no hepatosplenomegaly or masses, no abdominal bruits or hernia , no rebound or guarding.   Extremities: No lower extremity edema. No clubbing or deformities. Neuro: Alert and oriented x 3.  Grossly intact. Skin: Warm and dry, no jaundice.   Psych: Alert and cooperative, normal mood and affect.   Labs: CMP  No results found for: NA, K, CL, CO2, GLUCOSE, BUN, CREATININE, CALCIUM, PROT, ALBUMIN, AST, ALT, ALKPHOS, BILITOT, GFRNONAA, GFRAA No results found for: WBC, HGB, HCT, MCV, PLT  Imaging Studies: Mm 3d Screen Breast Bilateral  Result Date: 11/22/2017 CLINICAL DATA:  Screening. EXAM: DIGITAL SCREENING BILATERAL MAMMOGRAM WITH TOMO AND CAD COMPARISON:  Previous exam(s). ACR Breast Density Category b: There are scattered areas of fibroglandular density. FINDINGS: There are no findings suspicious for malignancy. Images were processed with CAD. IMPRESSION: No mammographic evidence of malignancy. A result letter of this screening mammogram will be mailed directly to the patient. RECOMMENDATION: Screening mammogram in one year.  (Code:SM-B-01Y) BI-RADS CATEGORY  1: Negative. Electronically Signed   By: Abelardo Diesel M.D.   On: 11/22/2017 13:39    Assessment and Plan:   Stacy Cunningham is a 67 y.o. y/o female here for follow-up of abdominal pain  Abdominal pain is completely resolved Continue to avoid NSAIDs Since pain has not reoccurred despite stopping Protonix a week ago, will discontinue Protonix Patient would like a prescription for abdominal pain if it reoccurs and is willing to try Zantac We will give 30-day supply of Zantac only and patient will use this if abdominal pain reoccurs Patient was asked to call us if abdominal reoccurs as well and she verbalized understanding Continue to follow with primary care provider Follow-up in our clinic as needed    Dr [name]

## 2018-01-20 ENCOUNTER — Other Ambulatory Visit: Payer: Self-pay

## 2018-01-20 ENCOUNTER — Emergency Department
Admission: EM | Admit: 2018-01-20 | Discharge: 2018-01-20 | Disposition: A | Discharge status: Home / Self Care | Payer: Medicaid Other | Attending: Emergency Medicine | Admitting: Emergency Medicine

## 2018-01-20 DIAGNOSIS — Z7984 Long term (current) use of oral hypoglycemic drugs: Secondary | ICD-10-CM | POA: Insufficient documentation

## 2018-01-20 DIAGNOSIS — J0101 Acute recurrent maxillary sinusitis: Secondary | ICD-10-CM | POA: Diagnosis not present

## 2018-01-20 DIAGNOSIS — Z79899 Other long term (current) drug therapy: Secondary | ICD-10-CM | POA: Diagnosis not present

## 2018-01-20 DIAGNOSIS — I10 Essential (primary) hypertension: Secondary | ICD-10-CM | POA: Insufficient documentation

## 2018-01-20 DIAGNOSIS — E119 Type 2 diabetes mellitus without complications: Secondary | ICD-10-CM | POA: Diagnosis not present

## 2018-01-20 DIAGNOSIS — R0981 Nasal congestion: Secondary | ICD-10-CM | POA: Diagnosis present

## 2018-01-20 LAB — GLUCOSE, CAPILLARY: GLUCOSE-CAPILLARY: 93 mg/dL (ref 70–99)

## 2018-01-20 MED ORDER — PREDNISONE 10 MG PO TABS: ORAL_TABLET | ORAL | 0 refills | Status: DC

## 2018-01-20 NOTE — ED Provider Notes (Signed)
[facility] Emergency Department Provider Note  ____________________________________________   First MD Initiated Contact with Patient 01/20/18 1617     (approximate)  I have reviewed the triage vital signs and the nursing notes.   HISTORY  Chief Complaint Nasal Congestion   HPI Stacy Cunningham is a 67 y.o. female presents to the ED with complaint of congestion, headache, sinus pressure and left maxillary sinus area.  Patient states she has had sinus infections before the last one being last year.  She was seen in urgent care where she was given a prescription for Zithromax and Bromfed-DM 2 days ago.  She states she got concerned when she blew some bright red blood out of her nose today.  She denies any fever or chills.  She denies any worsening of her symptoms other than seeing the blood.  There was no continued nosebleed.  Past Medical History:  Diagnosis Date  . Diabetes mellitus without complication ([place])    Borderline Diabetes  . GERD (gastroesophageal reflux disease)   . Hypertension   . Neuropathy   . Neuropathy     Patient Active Problem List   Diagnosis Date Noted  . Stomach irritation   . Abdominal pain, epigastric   . Special screening for malignant neoplasms, colon   . Diverticulosis of large intestine without diverticulitis   . Closed fracture of phalanx of foot 01/04/2017  . 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 Acute hepatitis C 06/05/2002    Past Surgical History:  Procedure Laterality Date  . COLONOSCOPY WITH PROPOFOL N/A 07/13/2017   Procedure: COLONOSCOPY WITH PROPOFOL;  Surgeon: Virgel Manifold, MD;  Location: [facility] ENDOSCOPY;  Service: Endoscopy;  Laterality: N/A;  . ESOPHAGOGASTRODUODENOSCOPY (EGD) WITH PROPOFOL N/A 09/14/2017   Procedure: ESOPHAGOGASTRODUODENOSCOPY (EGD) WITH PROPOFOL;  Surgeon: Virgel Manifold, MD;  Location: [facility] ENDOSCOPY;  Service: Endoscopy;  Laterality: N/A;    Prior to Admission medications     Medication Sig Start Date End Date Taking? Authorizing Provider  ferrous sulfate 325 (65 FE) MG tablet Take 325 mg by mouth daily with breakfast.    [provider]  Gabapentin (NEURONTIN PO) Take 600 mg by mouth 3 (three) times daily.     [provider]  hydrochlorothiazide (HYDRODIURIL) 25 MG tablet Take 25 mg by mouth daily.    [provider]  ibuprofen (ADVIL,MOTRIN) 800 MG tablet ibuprofen 800 mg tablet  Take 1 tablet 3 times a day by oral route.    [provider]  IRON PO iron    [provider]  lisinopril (PRINIVIL,ZESTRIL) 5 MG tablet Take 5 mg by mouth daily.    [provider]  LISINOPRIL PO lisinopril    [provider]  metFORMIN (GLUCOPHAGE) 500 MG tablet Take 500 mg by mouth. Takes 2 in am and takes 1 in pm 10/21/07   [provider]  predniSONE (DELTASONE) 10 MG tablet Take 3 tablets once a day for next 3 days 01/20/18   Johnn Hai, PA-C  ranitidine (ZANTAC) 150 MG tablet Take 1 tablet (150 mg total) by mouth daily. 12/11/17 01/10/18  Virgel Manifold, MD  SIMVASTATIN PO simvastatin    [provider]    Allergies Sulfa antibiotics and Darvon [propoxyphene]  Family History  Problem Relation Age of Onset  . Breast cancer Sister 39    Social History Social History   Tobacco Use  . Smoking status: Never Smoker  . Smokeless tobacco: Never Used  Substance Use Topics  . Alcohol use:  Not Currently    Comment: occas.  . Drug use: No    Review of Systems Constitutional: No fever/chills Eyes: No visual changes. ENT: Positive nasal congestion.  Positive sinus pressure. Cardiovascular: Denies chest pain. Respiratory: Denies shortness of breath. Gastrointestinal: No abdominal pain.  No nausea, no vomiting.  Musculoskeletal: Negative for back pain. Skin: Negative for rash. Neurological: Negative for headaches, focal weakness or  numbness. ____________________________________________   PHYSICAL EXAM:  VITAL SIGNS: ED Triage Vitals  Enc Vitals Group     BP 01/20/18 1609 111/77     Pulse Rate 01/20/18 1609 98     Resp 01/20/18 1609 14     Temp 01/20/18 1609 98 F (36.7 C)     Temp Source 01/20/18 1609 Oral     SpO2 01/20/18 1609 97 %     Weight 01/20/18 1607 190 lb (86.2 kg)     Height 01/20/18 1607 5\' 6"  (1.676 m)     Head Circumference --      Peak Flow --      Pain Score 01/20/18 1606 0     Pain Loc --      Pain Edu? --      Excl. in [place]? --    Constitutional: Alert and oriented. Well appearing and in no acute distress. Eyes: Conjunctivae are normal.  Head: Atraumatic. Nose: No congestion/rhinnorhea.  EACs and TMs are clear bilaterally.  There is tenderness to percussion of the left maxillary sinus.  No evidence of nosebleed at this time. Mouth/Throat: Mucous membranes are moist.  Oropharynx non-erythematous. Neck: No stridor.   Hematological/Lymphatic/Immunilogical: No cervical lymphadenopathy. Cardiovascular: Normal rate, regular rhythm. Grossly normal heart sounds.  Good peripheral circulation. Respiratory: Normal respiratory effort.  No retractions. Lungs CTAB. Neurologic:  Normal speech and language. No gross focal neurologic deficits are appreciated.  Skin:  Skin is warm, dry and intact.  Psychiatric: Mood and affect are normal. Speech and behavior are normal.  ____________________________________________   LABS (all labs ordered are listed, but only abnormal results are displayed)  Labs Reviewed  GLUCOSE, CAPILLARY  CBG MONITORING, ED    PROCEDURES  Procedure(s) performed: None  Procedures  Critical Care performed: No  ____________________________________________   INITIAL IMPRESSION / ASSESSMENT AND PLAN / ED COURSE  As part of my medical decision making, I reviewed the following data within the electronic MEDICAL RECORD NUMBER Notes from prior ED visits and [place] Controlled  Substance Database  Patient presents to the ED with complaint of sinus symptoms and is currently taking Zithromax and Bromfed-DM.  She became concerned because she saw some bright red blood when she blew her nose.  She is not continued to have a nosebleed.  Physical exam is consistent with a left maxillary sinusitis.  Patient was placed on prednisone 30 mg once daily for the next 3 days.  Patient's blood sugar was 93 and she is aware that her blood sugar may elevate over the next several days which is going to be temporary.  She is to follow-up with her PCP if any continued problems.  ____________________________________________   FINAL CLINICAL IMPRESSION(S) / ED DIAGNOSES  Final diagnoses:  Acute recurrent maxillary sinusitis     ED Discharge Orders         Ordered    predniSONE (DELTASONE) 10 MG tablet     01/20/18 1647           Note:  This document was prepared using Dragon voice recognition software and may include unintentional dictation errors.  Johnn Hai, PA-C 01/20/18 1706    Arta Silence, MD 01/20/18 2109

## 2018-01-20 NOTE — Discharge Instructions (Addendum)
Continue taking antibiotics and medication that you are currently on.  Begin taking prednisone as directed once a day at the same time each day.  As we discussed you will see your blood sugars temporarily elevate.  You may also use saline nose spray to provide moisture to your nose.

## 2018-01-20 NOTE — ED Notes (Signed)
Pt reports congested   Headache  Weakness  Symptoms x  10  Days  Ago  seen at urgent care 2 days  For  rhinitis and congestion and placed on anti biotics and decongestant Pt also notes some bleeding when she blows her nose

## 2018-01-20 NOTE — ED Triage Notes (Signed)
Pt arrives via POV, ambulatory to triage with no acute distress notes. Pt states she has had sinus problems x1 week, seen at urgent care, given azithromycin, and bromphenir-pseudophed dm to take. Pt states she noticed she was blowing bright red blood when blowing nose.

## 2018-01-22 ENCOUNTER — Other Ambulatory Visit: Payer: Self-pay

## 2018-01-22 ENCOUNTER — Emergency Department: Payer: Medicare Other

## 2018-01-22 ENCOUNTER — Emergency Department
Admission: EM | Admit: 2018-01-22 | Discharge: 2018-01-22 | Disposition: A | Discharge status: Home / Self Care | Payer: Medicare Other | Attending: Emergency Medicine | Admitting: Emergency Medicine

## 2018-01-22 ENCOUNTER — Encounter: Payer: Self-pay | Admitting: Emergency Medicine

## 2018-01-22 DIAGNOSIS — Z79899 Other long term (current) drug therapy: Secondary | ICD-10-CM | POA: Diagnosis not present

## 2018-01-22 DIAGNOSIS — Z7984 Long term (current) use of oral hypoglycemic drugs: Secondary | ICD-10-CM | POA: Diagnosis not present

## 2018-01-22 DIAGNOSIS — K2970 Gastritis, unspecified, without bleeding: Secondary | ICD-10-CM | POA: Diagnosis not present

## 2018-01-22 DIAGNOSIS — I10 Essential (primary) hypertension: Secondary | ICD-10-CM | POA: Diagnosis not present

## 2018-01-22 DIAGNOSIS — R1013 Epigastric pain: Secondary | ICD-10-CM

## 2018-01-22 DIAGNOSIS — E119 Type 2 diabetes mellitus without complications: Secondary | ICD-10-CM | POA: Insufficient documentation

## 2018-01-22 DIAGNOSIS — K2900 Acute gastritis without bleeding: Secondary | ICD-10-CM

## 2018-01-22 LAB — COMPREHENSIVE METABOLIC PANEL
ALK PHOS: 72 U/L (ref 38–126)
ALT: 26 U/L (ref 0–44)
AST: 26 U/L (ref 15–41)
Albumin: 4.3 g/dL (ref 3.5–5.0)
Anion gap: 9 (ref 5–15)
BUN: 17 mg/dL (ref 8–23)
CALCIUM: 9.5 mg/dL (ref 8.9–10.3)
CHLORIDE: 105 mmol/L (ref 98–111)
CO2: 25 mmol/L (ref 22–32)
Creatinine, Ser: 0.78 mg/dL (ref 0.44–1.00)
GFR calc Af Amer: >60 mL/min (ref >60)
GFR calc non Af Amer: >60 mL/min (ref >60)
Glucose, Bld: 149 mg/dL — ABNORMAL HIGH (ref 70–99)
Potassium: 4.3 mmol/L (ref 3.5–5.1)
SODIUM: 139 mmol/L (ref 135–145)
TOTAL PROTEIN: 7.6 g/dL (ref 6.5–8.1)
Total Bilirubin: 0.4 mg/dL (ref 0.3–1.2)

## 2018-01-22 LAB — LIPASE, BLOOD: LIPASE: 33 U/L (ref 11–51)

## 2018-01-22 LAB — TROPONIN I

## 2018-01-22 LAB — CBC
HEMATOCRIT: 35.1 % (ref 35.0–47.0)
HEMOGLOBIN: 11.4 g/dL — AB (ref 12.0–16.0)
MCH: 22.9 pg — ABNORMAL LOW (ref 26.0–34.0)
MCHC: 32.4 g/dL (ref 32.0–36.0)
MCV: 70.7 fL — ABNORMAL LOW (ref 80.0–100.0)
Platelets: 252 10*3/uL (ref 150–440)
RBC: 4.96 MIL/uL (ref 3.80–5.20)
RDW: 15.6 % — ABNORMAL HIGH (ref 11.5–14.5)
WBC: 14 10*3/uL — AB (ref 3.6–11.0)

## 2018-01-22 MED ORDER — FAMOTIDINE 40 MG PO TABS: 40.0000 mg | ORAL_TABLET | Freq: Every evening | ORAL | 0 refills | Status: DC

## 2018-01-22 MED ORDER — GI COCKTAIL ~~LOC~~
30.0000 mL | Freq: Once | ORAL | Status: AC
  Administered 2018-01-22: 30 mL via ORAL
  Filled 2018-01-22: qty 30

## 2018-01-22 MED ORDER — IOPAMIDOL (ISOVUE-300) INJECTION 61%
100.0000 mL | Freq: Once | INTRAVENOUS | Status: AC | PRN
  Administered 2018-01-22: 100 mL via INTRAVENOUS

## 2018-01-22 MED ORDER — SODIUM CHLORIDE 0.9 % IV BOLUS
1000.0000 mL | Freq: Once | INTRAVENOUS | Status: AC
  Administered 2018-01-22: 1000 mL via INTRAVENOUS

## 2018-01-22 NOTE — ED Provider Notes (Signed)
[facility] Emergency Department Provider Note  ____________________________________________   First MD Initiated Contact with Patient 01/22/18 2032     (approximate)  I have reviewed the triage vital signs and the nursing notes.   HISTORY  Chief Complaint Abdominal Pain   HPI Stacy Cunningham is a 67 y.o. female with history of diabetes, hypertension and GERD who is presenting to the emergency department epigastric pain over the course of the past 12 hours.  Says that the pain feels like a mild cramping pain without any radiation.  Denies any nausea vomiting or diarrhea.  States that she was in the emergency department several days ago and given a prescription for steroids over 3 days as well as azithromycin.  Denies any known sick contacts.  Says that she saw a bulge over the left upper quadrant this morning which made her concerned to come to the hospital for further evaluation.  Past Medical History:  Diagnosis Date  . Diabetes mellitus without complication ([place])    Borderline Diabetes  . GERD (gastroesophageal reflux disease)   . Hypertension   . Neuropathy   . Neuropathy     Patient Active Problem List   Diagnosis Date Noted  . Stomach irritation   . Abdominal pain, epigastric   . Special screening for malignant neoplasms, colon   . Diverticulosis of large intestine without diverticulitis   . Closed fracture of phalanx of foot 01/04/2017  . 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 Acute hepatitis C 06/05/2002    Past Surgical History:  Procedure Laterality Date  . COLONOSCOPY WITH PROPOFOL N/A 07/13/2017   Procedure: COLONOSCOPY WITH PROPOFOL;  Surgeon: Virgel Manifold, MD;  Location: [facility] ENDOSCOPY;  Service: Endoscopy;  Laterality: N/A;  . ESOPHAGOGASTRODUODENOSCOPY (EGD) WITH PROPOFOL N/A 09/14/2017   Procedure: ESOPHAGOGASTRODUODENOSCOPY (EGD) WITH PROPOFOL;  Surgeon: Virgel Manifold, MD;  Location: [facility] ENDOSCOPY;  Service: Endoscopy;   Laterality: N/A;    Prior to Admission medications   Medication Sig Start Date End Date Taking? Authorizing Provider  ferrous sulfate 325 (65 FE) MG tablet Take 325 mg by mouth daily with breakfast.    [provider]  Gabapentin (NEURONTIN PO) Take 600 mg by mouth 3 (three) times daily.     [provider]  hydrochlorothiazide (HYDRODIURIL) 25 MG tablet Take 25 mg by mouth daily.    [provider]  ibuprofen (ADVIL,MOTRIN) 800 MG tablet ibuprofen 800 mg tablet  Take 1 tablet 3 times a day by oral route.    [provider]  IRON PO iron    [provider]  lisinopril (PRINIVIL,ZESTRIL) 5 MG tablet Take 5 mg by mouth daily.    [provider]  LISINOPRIL PO lisinopril    [provider]  metFORMIN (GLUCOPHAGE) 500 MG tablet Take 500 mg by mouth. Takes 2 in am and takes 1 in pm 10/21/07   [provider]  predniSONE (DELTASONE) 10 MG tablet Take 3 tablets once a day for next 3 days 01/20/18   Johnn Hai, PA-C  ranitidine (ZANTAC) 150 MG tablet Take 1 tablet (150 mg total) by mouth daily. 12/11/17 01/10/18  Virgel Manifold, MD  SIMVASTATIN PO simvastatin    [provider]    Allergies Sulfa antibiotics and Darvon [propoxyphene]  Family History  Problem Relation Age of Onset  . Breast cancer Sister 54    Social History Social History   Tobacco Use  . Smoking status: Never Smoker  . Smokeless tobacco: Never Used  Substance  Use Topics  . Alcohol use: Not Currently    Comment: occas.  . Drug use: No    Review of Systems  Constitutional: No fever/chills Eyes: No visual changes. ENT: No sore throat. Cardiovascular: Denies chest pain. Respiratory: Denies shortness of breath. Gastrointestinal:  No nausea, no vomiting.  No diarrhea.  No constipation. Genitourinary: Negative for dysuria. Musculoskeletal: Negative for back pain. Skin: Negative for rash. Neurological: Negative for headaches,  focal weakness or numbness.   ____________________________________________   PHYSICAL EXAM:  VITAL SIGNS: ED Triage Vitals  Enc Vitals Group     BP 01/22/18 1737 128/77     Pulse Rate 01/22/18 1737 79     Resp 01/22/18 1737 20     Temp 01/22/18 1737 98 F (36.7 C)     Temp Source 01/22/18 1737 Oral     SpO2 01/22/18 1737 99 %     Weight 01/22/18 1737 190 lb (86.2 kg)     Height 01/22/18 1737 5\' 6"  (1.676 m)     Head Circumference --      Peak Flow --      Pain Score 01/22/18 1739 3     Pain Loc --      Pain Edu? --      Excl. in [place]? --     Constitutional: Alert and oriented. Well appearing and in no acute distress. Eyes: Conjunctivae are normal.  Head: Atraumatic. Nose: No congestion/rhinnorhea. Mouth/Throat: Mucous membranes are moist.  Neck: No stridor.   Cardiovascular: Normal rate, regular rhythm. Grossly normal heart sounds.  Good peripheral circulation. Respiratory: Normal respiratory effort.  No retractions. Lungs CTAB. Gastrointestinal: Soft with moderate tenderness to palpation over the epigastrium without any rebound or guarding.  Negative Murphy sign. No distention. No CVA tenderness. Musculoskeletal: No lower extremity tenderness nor edema.  No joint effusions. Neurologic:  Normal speech and language. No gross focal neurologic deficits are appreciated. Skin:  Skin is warm, dry and intact. No rash noted. Psychiatric: Mood and affect are normal. Speech and behavior are normal.  ____________________________________________   LABS (all labs ordered are listed, but only abnormal results are displayed)  Labs Reviewed  COMPREHENSIVE METABOLIC PANEL - Abnormal; Notable for the following components:      Result Value   Glucose, Bld 149 (*)    All other components within normal limits  CBC - Abnormal; Notable for the following components:   WBC 14.0 (*)    Hemoglobin 11.4 (*)    MCV 70.7 (*)    MCH 22.9 (*)    RDW 15.6 (*)    All other components within  normal limits  LIPASE, BLOOD  TROPONIN I  URINALYSIS, COMPLETE (UACMP) WITH MICROSCOPIC   ____________________________________________  EKG  ED ECG REPORT I, Doran Stabler, the attending physician, personally viewed and interpreted this ECG.   Date: 01/22/2018  EKG Time: 2112  Rate: 69  Rhythm: normal sinus rhythm  Axis: Normal  Intervals:none  ST&T Change: No ST segment elevation or depression.  No abnormal T wave inversion.  ____________________________________________  RADIOLOGY  No acute intra-abdominal or pelvic finding.  Cholelithiasis without CT evidence of acute inflammatory process.  Nonspecific pancreatic tail fatty replacement without associated mass or surrounding inflammation.  Diverticulosis.  Normal appendix.  Large uterus with fibroids. ____________________________________________   PROCEDURES  Procedure(s) performed:   Procedures  Critical Care performed:   ____________________________________________   INITIAL IMPRESSION / ASSESSMENT AND PLAN / ED COURSE  Pertinent labs & imaging results that were available during my  care of the patient were reviewed by me and considered in my medical decision making (see chart for details).  Differential diagnosis includes, but is not limited to, biliary disease (biliary colic, acute cholecystitis, cholangitis, choledocholithiasis, etc), intrathoracic causes for epigastric abdominal pain including ACS, gastritis, duodenitis, pancreatitis, small bowel or large bowel obstruction, abdominal aortic aneurysm, hernia, and ulcer(s). As part of my medical decision making, I reviewed the following data within the electronic MEDICAL RECORD NUMBER Notes from prior ED visits  ----------------------------------------- 11:24 PM on 01/22/2018 -----------------------------------------  Patient says that she is pain-free after the GI cocktail.  Reexamined her abdomen and she is nontender throughout.  CT abdomen and pelvis with  gallstones but no evidence of cholecystitis.  Patient aware of CT results including the gallstones, fibroids and other incidental findings.  She will be following up with surgery.  Will be discharged home with Pepcid.  Likely gastritis secondary to recent steroid use. ____________________________________________   FINAL CLINICAL IMPRESSION(S) / ED DIAGNOSES  Epigastric abdominal pain.  Gastritis.  NEW MEDICATIONS STARTED DURING THIS VISIT:  New Prescriptions   No medications on file     Note:  This document was prepared using Dragon voice recognition software and may include unintentional dictation errors.     Orbie Pyo, MD 01/22/18 2325

## 2018-01-22 NOTE — ED Triage Notes (Signed)
Pt to ED via POV from Urgent Care, was sent from UC due to abd mass located in LUQ, pt tender to touch, states she noticed abd this am. VSS

## 2018-01-28 ENCOUNTER — Ambulatory Visit: Payer: Medicaid Other | Admitting: Gastroenterology

## 2018-01-31 ENCOUNTER — Ambulatory Visit: Payer: Self-pay | Admitting: General Surgery

## 2018-01-31 NOTE — H&P (View-Only) (Signed)
PATIENT PROFILE: Stacy Cunningham is a 67 y.o. female who presents to the Clinic for consultation at the request of Dr. Dr. Clearnce Cunningham for evaluation of cholelithiasis.  PCP:  Stacy Cunningham, Stacy Forts, MD  HISTORY OF PRESENT ILLNESS: Ms. Stacy Cunningham reports having epigastric pain since couple of months ago. Pain on the epigastric area. Pain does not radiates. There is no aggravator or alleviator factor.   Patient went to ED on 01/22/18 and CT scan was done showing cholelithiasis with no sign of cholecystitis. I personally reviewed the images. She was having some epigastric pain that was not improving. Pain descried as cramping with no radiation. Pain improved with pain medications given in the ED. Pain not associated with aggravator factor.    PROBLEM LIST: - Epigastric pain, cholelithiasis - Diverticulosis - Diabetes mellitus - Neuropathy  GENERAL REVIEW OF SYSTEMS:   General ROS: negative for - chills, fatigue, fever, weight gain or weight loss Allergy and Immunology ROS: negative for - hives  Hematological and Lymphatic ROS: negative for - bleeding problems or bruising, negative for palpable nodes Endocrine ROS: negative for - heat or cold intolerance, hair changes Respiratory ROS: negative for - cough, shortness of breath or wheezing Cardiovascular ROS: no chest pain or palpitations GI ROS: negative for nausea, vomiting, diarrhea, constipation. Positive for abdominal pain Musculoskeletal ROS: negative for - joint swelling or muscle pain Neurological ROS: negative for - confusion, syncope Dermatological ROS: negative for pruritus and rash Psychiatric: negative for anxiety, depression, difficulty sleeping and memory loss  MEDICATIONS: Medication Sig Start Date End Date Taking? Authorizing Provider  ferrous sulfate 325 (65 FE) MG tablet Take 325 mg by mouth daily with breakfast.    [provider]  Gabapentin (NEURONTIN PO) Take 600 mg by mouth 3 (three) times  daily.     [provider]  hydrochlorothiazide (HYDRODIURIL) 25 MG tablet Take 25 mg by mouth daily.    [provider]  ibuprofen (ADVIL,MOTRIN) 800 MG tablet ibuprofen 800 mg tablet Take 1 tablet 3 times a day by oral route.    [provider]  IRON PO iron    [provider]  lisinopril (PRINIVIL,ZESTRIL) 5 MG tablet Take 5 mg by mouth daily.    [provider]  LISINOPRIL PO lisinopril    [provider]  metFORMIN (GLUCOPHAGE) 500 MG tablet Take 500 mg by mouth. Takes 2 in am and takes 1 in pm 10/21/07   [provider]  predniSONE (DELTASONE) 10 MG tablet Take 3 tablets once a day for next 3 days 01/20/18   Stacy Hai, PA-C  ranitidine (ZANTAC) 150 MG tablet Take 1 tablet (150 mg total) by mouth daily. 12/11/17 01/10/18  Stacy Manifold, MD  SIMVASTATIN PO simvastatin    [provider]    ALLERGIES: Stacy Cunningham  PAST MEDICAL HISTORY: - Diverticulosis - Diabetes mellitus - Neuropathy  PAST SURGICAL HISTORY: Colonoscopy Upper endoscopy  FAMILY HISTORY: Breast cancer  SOCIAL HISTORY:      Tobacco Use  . Smoking status: Never Smoker  . Smokeless tobacco: Never Used  Substance Use Topics  . Alcohol use: Not Currently    Comment: occas.  . Drug use: No    PHYSICAL EXAM:  GENERAL: Alert, active, oriented x3  HEENT: Pupils equal reactive to light. Extraocular movements are intact. Sclera clear. Palpebral conjunctiva normal red color.Pharynx clear.  NECK: Supple with no palpable mass and no adenopathy.  LUNGS: Sound clear with no rales rhonchi or wheezes.  HEART: Regular  rhythm S1 and S2 without murmur.  ABDOMEN: Soft and depressible, nontender with no palpable mass, no hepatomegaly.   EXTREMITIES: Well-developed well-nourished symmetrical with no dependent edema.  NEUROLOGICAL: Awake alert oriented, facial expression symmetrical,  moving all extremities.  REVIEW OF DATA: CBC and CMP done at [facility] on 01/22/18 were personally reviewed with leukocytosis. Normal liver and bilirubin levels.    ASSESSMENT: [name] is a 67 y.o. female presenting for consultation for cholelithiasis.    Patient was oriented about the diagnosis of cholelithiasis. Also oriented about what is the gallbladder, its anatomy and function and the implications of having stones. The patient was oriented about the treatment alternatives (observation vs cholecystectomy). Patient was oriented that a low percentage of patient will continue to have similar pain symptoms even after the gallbladder is removed. Surgical technique (open vs laparoscopic) was discussed. It was also discussed the goals of the surgery (decrease the pain episodes and avoid the risk of cholecystitis) and the risk of surgery including: bleeding, infection, common bile duct injury, stone retention, injury to other organs such as bowel, liver, stomach, other complications such as hernia, bowel obstruction among others. Also discussed with patient about anesthesia and its complications such as: reaction to medications, pneumonia, heart complications, death, among others.   PLAN: 1. Laparoscopic cholecystectomy (25366) 2. CBC, C[facility] on 01/22/18 [facility] 3. Dr. William Hamburger Cunningham clearance 4. Stop aspirin 5 days before surgery 5. Contact us if has any question or concern  Patient verbalized understanding, all questions were answered, and were agreeable with the plan outlined above.     Stacy Pun, MD  Electronically signed by Stacy Pun, MD

## 2018-01-31 NOTE — H&P (Signed)
PATIENT PROFILE: Stacy Cunningham is a 67 y.o. female who presents to the Clinic for consultation at the request of Dr. Dr. Clearnce Hasten for evaluation of cholelithiasis.  PCP:  Karl Luke, Althia Forts, MD  HISTORY OF PRESENT ILLNESS: Stacy Cunningham reports having epigastric pain since couple of months ago. Pain on the epigastric area. Pain does not radiates. There is no aggravator or alleviator factor.   Patient went to ED on 01/22/18 and CT scan was done showing cholelithiasis with no sign of cholecystitis. I personally reviewed the images. She was having some epigastric pain that was not improving. Pain descried as cramping with no radiation. Pain improved with pain medications given in the ED. Pain not associated with aggravator factor.    PROBLEM LIST: - Epigastric pain, cholelithiasis - Diverticulosis - Diabetes mellitus - Neuropathy  GENERAL REVIEW OF SYSTEMS:   General ROS: negative for - chills, fatigue, fever, weight gain or weight loss Allergy and Immunology ROS: negative for - hives  Hematological and Lymphatic ROS: negative for - bleeding problems or bruising, negative for palpable nodes Endocrine ROS: negative for - heat or cold intolerance, hair changes Respiratory ROS: negative for - cough, shortness of breath or wheezing Cardiovascular ROS: no chest pain or palpitations GI ROS: negative for nausea, vomiting, diarrhea, constipation. Positive for abdominal pain Musculoskeletal ROS: negative for - joint swelling or muscle pain Neurological ROS: negative for - confusion, syncope Dermatological ROS: negative for pruritus and rash Psychiatric: negative for anxiety, depression, difficulty sleeping and memory loss  MEDICATIONS: Medication Sig Start Date End Date Taking? Authorizing Provider  ferrous sulfate 325 (65 FE) MG tablet Take 325 mg by mouth daily with breakfast.    [provider]  Gabapentin (NEURONTIN PO) Take 600 mg by mouth 3 (three) times  daily.     [provider]  hydrochlorothiazide (HYDRODIURIL) 25 MG tablet Take 25 mg by mouth daily.    [provider]  ibuprofen (ADVIL,MOTRIN) 800 MG tablet ibuprofen 800 mg tablet Take 1 tablet 3 times a day by oral route.    [provider]  IRON PO iron    [provider]  lisinopril (PRINIVIL,ZESTRIL) 5 MG tablet Take 5 mg by mouth daily.    [provider]  LISINOPRIL PO lisinopril    [provider]  metFORMIN (GLUCOPHAGE) 500 MG tablet Take 500 mg by mouth. Takes 2 in am and takes 1 in pm 10/21/07   [provider]  predniSONE (DELTASONE) 10 MG tablet Take 3 tablets once a day for next 3 days 01/20/18   Johnn Hai, PA-C  ranitidine (ZANTAC) 150 MG tablet Take 1 tablet (150 mg total) by mouth daily. 12/11/17 01/10/18  Virgel Manifold, MD  SIMVASTATIN PO simvastatin    [provider]    ALLERGIES: Gretchen Portela  PAST MEDICAL HISTORY: - Diverticulosis - Diabetes mellitus - Neuropathy  PAST SURGICAL HISTORY: Colonoscopy Upper endoscopy  FAMILY HISTORY: Breast cancer  SOCIAL HISTORY:      Tobacco Use  . Smoking status: Never Smoker  . Smokeless tobacco: Never Used  Substance Use Topics  . Alcohol use: Not Currently    Comment: occas.  . Drug use: No    PHYSICAL EXAM:  GENERAL: Alert, active, oriented x3  HEENT: Pupils equal reactive to light. Extraocular movements are intact. Sclera clear. Palpebral conjunctiva normal red color.Pharynx clear.  NECK: Supple with no palpable mass and no adenopathy.  LUNGS: Sound clear with no rales rhonchi or wheezes.  HEART: Regular  rhythm S1 and S2 without murmur.  ABDOMEN: Soft and depressible, nontender with no palpable mass, no hepatomegaly.   EXTREMITIES: Well-developed well-nourished symmetrical with no dependent edema.  NEUROLOGICAL: Awake alert oriented, facial expression symmetrical,  moving all extremities.  REVIEW OF DATA: CBC and CMP done at [facility] on 01/22/18 were personally reviewed with leukocytosis. Normal liver and bilirubin levels.    ASSESSMENT: Stacy Cunningham is a 67 y.o. female presenting for consultation for cholelithiasis.    Patient was oriented about the diagnosis of cholelithiasis. Also oriented about what is the gallbladder, its anatomy and function and the implications of having stones. The patient was oriented about the treatment alternatives (observation vs cholecystectomy). Patient was oriented that a low percentage of patient will continue to have similar pain symptoms even after the gallbladder is removed. Surgical technique (open vs laparoscopic) was discussed. It was also discussed the goals of the surgery (decrease the pain episodes and avoid the risk of cholecystitis) and the risk of surgery including: bleeding, infection, common bile duct injury, stone retention, injury to other organs such as bowel, liver, stomach, other complications such as hernia, bowel obstruction among others. Also discussed with patient about anesthesia and its complications such as: reaction to medications, pneumonia, heart complications, death, among others.   PLAN: 1. Laparoscopic cholecystectomy (71165) 2. CBC, CMP done on 01/22/18 at [facility] 3. Dr. William Hamburger Revelo clearance 4. Stop aspirin 5 days before surgery 5. Contact us if has any question or concern  Patient verbalized understanding, all questions were answered, and were agreeable with the plan outlined above.     Herbert Pun, MD  Electronically signed by Herbert Pun, MD

## 2018-02-01 ENCOUNTER — Encounter
Admission: RE | Admit: 2018-02-01 | Discharge: 2018-02-01 | Disposition: A | Discharge status: Home / Self Care | Payer: Medicare Other | Source: Ambulatory Visit | Attending: General Surgery | Admitting: General Surgery

## 2018-02-01 ENCOUNTER — Other Ambulatory Visit: Payer: Self-pay

## 2018-02-01 HISTORY — DX: Panic disorder (episodic paroxysmal anxiety): F41.0

## 2018-02-01 HISTORY — DX: Headache, unspecified: R51.9

## 2018-02-01 HISTORY — DX: Anemia, unspecified: D64.9

## 2018-02-01 HISTORY — DX: Headache: R51

## 2018-02-01 NOTE — Patient Instructions (Signed)
Your procedure is scheduled on: 02-06-18 Report to Same Day Surgery 2nd floor medical mall [facility] Entrance-take elevator on left to 2nd floor.  Check in with surgery information desk.) To find out your arrival time please call [phone] between 1PM - 3PM on 02-05-18  Remember: Instructions that are not followed completely may result in serious medical risk, up to and including death, or upon the discretion of your surgeon and anesthesiologist your surgery may need to be rescheduled.    _x___ 1. Do not eat food after midnight the night before your procedure. You may drink WATER up to 2 hours before you are scheduled to arrive at the hospital for your procedure.  Do not drink WATER within 2 hours of your scheduled arrival to the hospital.  Type 1 and type 2 diabetics should only drink water.   ____Ensure clear carbohydrate drink on the way to the hospital for bariatric patients  ____Ensure clear carbohydrate drink 3 hours before surgery for Dr Dwyane Luo patients if physician instructed.   No gum chewing or hard candies.     __x__ 2. No Alcohol for 24 hours before or after surgery.   __x__3. No Smoking or e-cigarettes for 24 prior to surgery.  Do not use any chewable tobacco products for at least 6 hour prior to surgery   ____  4. Bring all medications with you on the day of surgery if instructed.    __x__ 5. Notify your doctor if there is any change in your medical condition     (cold, fever, infections).    x___6. On the morning of surgery brush your teeth with toothpaste and water.  You may rinse your mouth with mouth wash if you wish.  Do not swallow any toothpaste or mouthwash.   Do not wear jewelry, make-up, hairpins, clips or nail polish.  Do not wear lotions, powders, or perfumes. You may wear deodorant.  Do not shave 48 hours prior to surgery. Men may shave face and neck.  Do not bring valuables to the hospital.    [facility] is not responsible for any belongings  or valuables.               Contacts, dentures or bridgework may not be worn into surgery.  Leave your suitcase in the car. After surgery it may be brought to your room.  For patients admitted to the hospital, discharge time is determined by your treatment team.  _  Patients discharged the day of surgery will not be allowed to drive home.  You will need someone to drive you home and stay with you the night of your procedure.    Please read over the following fact sheets that you were given:   [facility] Preparing for Surgery   _x___ TAKE THE FOLLOWING MEDICATION THE MORNING OF SURGERY WITH A SMALL SIP OF WATER. These include:  1. NEURONTIN  2. PEPCID  3.  4.  5.  6.  ____Fleets enema or Magnesium Citrate as directed.   ____ Use CHG Soap or sage wipes as directed on instruction sheet   ____ Use inhalers on the day of surgery and bring to hospital day of surgery  _X___ Stop Metformin 2 days prior to surgery-LAST DOSE ON Sunday, OCT 13TH    ____ Take 1/2 of usual insulin dose the night before surgery and none on the morning     surgery.   _x___ Follow recommendations from Cardiologist, Pulmonologist or PCP regarding  stopping Aspirin, Coumadin, Plavix ,Eliquis, Effient, or Pradaxa, and Pletal-PT INSTRUCTED BY SURGEONS OFFICE TO STOP ASA 5 DAYS PRIOR  X____Stop Anti-inflammatories such as Advil, Aleve, Ibuprofen, Motrin, Naproxen, Naprosyn, Goodies powders or aspirin products NOW-OK to take Tylenol    ____ Stop supplements until after surgery.   ____ Bring C-Pap to the hospital.

## 2018-02-05 MED ORDER — CEFAZOLIN SODIUM-DEXTROSE 2-4 GM/100ML-% IV SOLN
2.0000 g | INTRAVENOUS | Status: AC
  Administered 2018-02-06: 2 g via INTRAVENOUS

## 2018-02-06 ENCOUNTER — Encounter
Admission: RE | Disposition: A | Discharge status: Home / Self Care | Payer: Self-pay | Source: Ambulatory Visit | Attending: General Surgery

## 2018-02-06 ENCOUNTER — Ambulatory Visit
Admission: RE | Admit: 2018-02-06 | Discharge: 2018-02-06 | Disposition: A | Discharge status: Home / Self Care | Payer: Medicare Other | Source: Ambulatory Visit | Attending: General Surgery | Admitting: General Surgery

## 2018-02-06 ENCOUNTER — Other Ambulatory Visit: Payer: Self-pay

## 2018-02-06 ENCOUNTER — Ambulatory Visit: Payer: Medicare Other | Admitting: Anesthesiology

## 2018-02-06 DIAGNOSIS — K8010 Calculus of gallbladder with chronic cholecystitis without obstruction: Secondary | ICD-10-CM | POA: Insufficient documentation

## 2018-02-06 DIAGNOSIS — Z7984 Long term (current) use of oral hypoglycemic drugs: Secondary | ICD-10-CM | POA: Diagnosis not present

## 2018-02-06 DIAGNOSIS — E119 Type 2 diabetes mellitus without complications: Secondary | ICD-10-CM | POA: Insufficient documentation

## 2018-02-06 DIAGNOSIS — Z882 Allergy status to sulfonamides status: Secondary | ICD-10-CM | POA: Insufficient documentation

## 2018-02-06 DIAGNOSIS — Z79899 Other long term (current) drug therapy: Secondary | ICD-10-CM | POA: Diagnosis not present

## 2018-02-06 HISTORY — PX: CHOLECYSTECTOMY: SHX55

## 2018-02-06 LAB — GLUCOSE, CAPILLARY
Glucose-Capillary: 122 mg/dL — ABNORMAL HIGH (ref 70–99)
Glucose-Capillary: 152 mg/dL — ABNORMAL HIGH (ref 70–99)

## 2018-02-06 SURGERY — LAPAROSCOPIC CHOLECYSTECTOMY
Anesthesia: General | Site: Abdomen

## 2018-02-06 MED ORDER — FENTANYL CITRATE (PF) 100 MCG/2ML IJ SOLN
INTRAMUSCULAR | Status: AC
  Filled 2018-02-06: qty 2

## 2018-02-06 MED ORDER — CEFAZOLIN SODIUM-DEXTROSE 2-4 GM/100ML-% IV SOLN
INTRAVENOUS | Status: AC
  Filled 2018-02-06: qty 100

## 2018-02-06 MED ORDER — BUPIVACAINE-EPINEPHRINE (PF) 0.5% -1:200000 IJ SOLN
INTRAMUSCULAR | Status: DC | PRN
  Administered 2018-02-06: 30 mL via PERINEURAL

## 2018-02-06 MED ORDER — BUPIVACAINE-EPINEPHRINE (PF) 0.5% -1:200000 IJ SOLN
INTRAMUSCULAR | Status: AC
  Filled 2018-02-06: qty 30

## 2018-02-06 MED ORDER — LIDOCAINE HCL (PF) 2 % IJ SOLN
INTRAMUSCULAR | Status: AC
  Filled 2018-02-06: qty 10

## 2018-02-06 MED ORDER — OXYCODONE HCL 5 MG/5ML PO SOLN: 5.0000 mg | Freq: Once | ORAL | Status: DC | PRN

## 2018-02-06 MED ORDER — MIDAZOLAM HCL 2 MG/2ML IJ SOLN
INTRAMUSCULAR | Status: AC
  Filled 2018-02-06: qty 2

## 2018-02-06 MED ORDER — LIDOCAINE HCL (CARDIAC) PF 100 MG/5ML IV SOSY
PREFILLED_SYRINGE | INTRAVENOUS | Status: DC | PRN
  Administered 2018-02-06: 100 mg via INTRAVENOUS

## 2018-02-06 MED ORDER — DEXAMETHASONE SODIUM PHOSPHATE 10 MG/ML IJ SOLN
INTRAMUSCULAR | Status: AC
  Filled 2018-02-06: qty 1

## 2018-02-06 MED ORDER — EPHEDRINE SULFATE 50 MG/ML IJ SOLN
INTRAMUSCULAR | Status: AC
  Filled 2018-02-06: qty 1

## 2018-02-06 MED ORDER — EPHEDRINE SULFATE 50 MG/ML IJ SOLN
INTRAMUSCULAR | Status: DC | PRN
  Administered 2018-02-06: 10 mg via INTRAVENOUS

## 2018-02-06 MED ORDER — PROPOFOL 10 MG/ML IV BOLUS
INTRAVENOUS | Status: AC
  Filled 2018-02-06: qty 20

## 2018-02-06 MED ORDER — ONDANSETRON HCL 4 MG/2ML IJ SOLN
INTRAMUSCULAR | Status: DC | PRN
  Administered 2018-02-06: 4 mg via INTRAVENOUS

## 2018-02-06 MED ORDER — FENTANYL CITRATE (PF) 100 MCG/2ML IJ SOLN
INTRAMUSCULAR | Status: DC | PRN
  Administered 2018-02-06 (×3): 50 ug via INTRAVENOUS

## 2018-02-06 MED ORDER — MIDAZOLAM HCL 2 MG/2ML IJ SOLN
INTRAMUSCULAR | Status: DC | PRN
  Administered 2018-02-06: 2 mg via INTRAVENOUS

## 2018-02-06 MED ORDER — SUGAMMADEX SODIUM 200 MG/2ML IV SOLN
INTRAVENOUS | Status: DC | PRN
  Administered 2018-02-06: 200 mg via INTRAVENOUS

## 2018-02-06 MED ORDER — SODIUM CHLORIDE 0.9 % IV SOLN
INTRAVENOUS | Status: DC
  Administered 2018-02-06: 11:00:00 via INTRAVENOUS

## 2018-02-06 MED ORDER — HYDROCODONE-ACETAMINOPHEN 5-325 MG PO TABS: 1.0000 | ORAL_TABLET | ORAL | 0 refills | Status: AC | PRN

## 2018-02-06 MED ORDER — HYDROCODONE-ACETAMINOPHEN 5-325 MG PO TABS
ORAL_TABLET | ORAL | Status: AC
  Administered 2018-02-06: 1 via ORAL
  Filled 2018-02-06: qty 1

## 2018-02-06 MED ORDER — FENTANYL CITRATE (PF) 100 MCG/2ML IJ SOLN
25.0000 ug | INTRAMUSCULAR | Status: DC | PRN
  Administered 2018-02-06 (×3): 25 ug via INTRAVENOUS
  Administered 2018-02-06 (×2): 50 ug via INTRAVENOUS

## 2018-02-06 MED ORDER — ONDANSETRON HCL 4 MG/2ML IJ SOLN
INTRAMUSCULAR | Status: AC
  Filled 2018-02-06: qty 2

## 2018-02-06 MED ORDER — ACETAMINOPHEN 10 MG/ML IV SOLN
INTRAVENOUS | Status: DC | PRN
  Administered 2018-02-06: 1000 mg via INTRAVENOUS

## 2018-02-06 MED ORDER — PROPOFOL 10 MG/ML IV BOLUS
INTRAVENOUS | Status: DC | PRN
  Administered 2018-02-06: 180 mg via INTRAVENOUS

## 2018-02-06 MED ORDER — HYDROCODONE-ACETAMINOPHEN 5-325 MG PO TABS
1.0000 | ORAL_TABLET | Freq: Once | ORAL | Status: AC
  Administered 2018-02-06: 1 via ORAL

## 2018-02-06 MED ORDER — SUGAMMADEX SODIUM 200 MG/2ML IV SOLN
INTRAVENOUS | Status: AC
  Filled 2018-02-06: qty 2

## 2018-02-06 MED ORDER — FENTANYL CITRATE (PF) 100 MCG/2ML IJ SOLN
INTRAMUSCULAR | Status: AC
  Administered 2018-02-06: 50 ug via INTRAVENOUS
  Filled 2018-02-06: qty 2

## 2018-02-06 MED ORDER — DEXAMETHASONE SODIUM PHOSPHATE 10 MG/ML IJ SOLN
INTRAMUSCULAR | Status: DC | PRN
  Administered 2018-02-06: 10 mg via INTRAVENOUS

## 2018-02-06 MED ORDER — ROCURONIUM BROMIDE 100 MG/10ML IV SOLN
INTRAVENOUS | Status: DC | PRN
  Administered 2018-02-06: 50 mg via INTRAVENOUS

## 2018-02-06 MED ORDER — FENTANYL CITRATE (PF) 100 MCG/2ML IJ SOLN
INTRAMUSCULAR | Status: AC
  Administered 2018-02-06: 25 ug via INTRAVENOUS
  Filled 2018-02-06: qty 2

## 2018-02-06 MED ORDER — ACETAMINOPHEN 10 MG/ML IV SOLN
INTRAVENOUS | Status: AC
  Filled 2018-02-06: qty 100

## 2018-02-06 MED ORDER — ROCURONIUM BROMIDE 50 MG/5ML IV SOLN
INTRAVENOUS | Status: AC
  Filled 2018-02-06: qty 1

## 2018-02-06 MED ORDER — OXYCODONE HCL 5 MG PO TABS: 5.0000 mg | ORAL_TABLET | Freq: Once | ORAL | Status: DC | PRN

## 2018-02-06 MED ORDER — PHENYLEPHRINE HCL 10 MG/ML IJ SOLN
INTRAMUSCULAR | Status: DC | PRN
  Administered 2018-02-06: 100 ug via INTRAVENOUS

## 2018-02-06 SURGICAL SUPPLY — 41 items
APPLIER CLIP 5 13 M/L LIGAMAX5 (MISCELLANEOUS) ×2
BLADE SURG SZ11 CARB STEEL (BLADE) ×2 IMPLANT
CANISTER SUCT 1200ML W/VALVE (MISCELLANEOUS) ×2 IMPLANT
CHLORAPREP W/TINT 26ML (MISCELLANEOUS) ×2 IMPLANT
CLIP APPLIE 5 13 M/L LIGAMAX5 (MISCELLANEOUS) ×1 IMPLANT
COVER WAND RF STERILE (DRAPES) ×2 IMPLANT
DERMABOND ADVANCED (GAUZE/BANDAGES/DRESSINGS) ×1
DERMABOND ADVANCED .7 DNX12 (GAUZE/BANDAGES/DRESSINGS) ×1 IMPLANT
ELECT REM PT RETURN 9FT ADLT (ELECTROSURGICAL) ×2
ELECTRODE REM PT RTRN 9FT ADLT (ELECTROSURGICAL) ×1 IMPLANT
GLOVE BIO SURGEON STRL SZ 6.5 (GLOVE) ×2 IMPLANT
GOWN STRL REUS W/ TWL LRG LVL3 (GOWN DISPOSABLE) ×4 IMPLANT
GOWN STRL REUS W/TWL LRG LVL3 (GOWN DISPOSABLE) ×4
GRASPER SUT TROCAR 14GX15 (MISCELLANEOUS) ×2 IMPLANT
HEMOSTAT SURGICEL 2X3 (HEMOSTASIS) IMPLANT
IRRIGATION STRYKERFLOW (MISCELLANEOUS) ×1 IMPLANT
IRRIGATOR STRYKERFLOW (MISCELLANEOUS) ×2
IV NS 1000ML (IV SOLUTION) ×1
IV NS 1000ML BAXH (IV SOLUTION) ×1 IMPLANT
KIT TURNOVER KIT A (KITS) ×2 IMPLANT
LABEL OR SOLS (LABEL) ×2 IMPLANT
NDL HPO THNWL 1X22GA REG BVL (NEEDLE) ×1 IMPLANT
NEEDLE HYPO 25X1 1.5 SAFETY (NEEDLE) ×2 IMPLANT
NEEDLE INSUFFLATION 14GA 120MM (NEEDLE) ×2 IMPLANT
NEEDLE SAFETY 22GX1 (NEEDLE) ×1
NS IRRIG 500ML POUR BTL (IV SOLUTION) ×2 IMPLANT
PACK LAP CHOLECYSTECTOMY (MISCELLANEOUS) ×2 IMPLANT
POUCH SPECIMEN RETRIEVAL 10MM (ENDOMECHANICALS) ×2 IMPLANT
SCISSORS METZENBAUM CVD 33 (INSTRUMENTS) ×2 IMPLANT
SET TRI-LUMEN FLTR TB AIRSEAL (TUBING) ×2 IMPLANT
SLEEVE ENDOPATH XCEL 5M (ENDOMECHANICALS) ×4 IMPLANT
SUT MNCRL 4-0 (SUTURE) ×1
SUT MNCRL 4-0 27XMFL (SUTURE) ×1
SUT MNCRL AB 4-0 PS2 18 (SUTURE) ×2 IMPLANT
SUT VIC AB 0 CT1 36 (SUTURE) ×2 IMPLANT
SUT VICRYL 0 AB UR-6 (SUTURE) ×2 IMPLANT
SUTURE MNCRL 4-0 27XMF (SUTURE) ×1 IMPLANT
TROCAR PORT AIRSEAL 5X120 (TROCAR) ×2 IMPLANT
TROCAR XCEL NON-BLD 11X100MML (ENDOMECHANICALS) ×2 IMPLANT
TROCAR XCEL NON-BLD 5MMX100MML (ENDOMECHANICALS) ×2 IMPLANT
TUBING INSUFFLATION (TUBING) IMPLANT

## 2018-02-06 NOTE — Transfer of Care (Signed)
Immediate Anesthesia Transfer of Care Note  Patient: Stacy Cunningham  Procedure(s) Performed: Procedure(s): LAPAROSCOPIC CHOLECYSTECTOMY (N/A)  Patient Location: PACU  Anesthesia Type:General  Level of Consciousness: sedated  Airway & Oxygen Therapy: Patient Spontanous Breathing and Patient connected to face mask oxygen  Post-op Assessment: Report given to RN and Post -op Vital signs reviewed and stable  Post vital signs: Reviewed and stable  Last Vitals:  Vitals:   02/06/18 1051 02/06/18 1533  BP: 128/83 130/80  Pulse: 74 91  Resp: 16 18  Temp: (!) 35.8 C (!) 36.2 C  SpO2: 014% 159%    Complications: No apparent anesthesia complications

## 2018-02-06 NOTE — Anesthesia Preprocedure Evaluation (Signed)
Anesthesia Evaluation  Patient identified by MRN, date of birth, ID band Patient awake    Reviewed: Allergy & Precautions, H&P , NPO status , Patient's Chart, lab work & pertinent test results  History of Anesthesia Complications Negative for: history of anesthetic complications  Airway Mallampati: III  TM Distance: >3 FB Neck ROM: full    Dental  (+) Chipped, Poor Dentition, Missing, Partial Upper   Pulmonary neg pulmonary ROS, neg shortness of breath,           Cardiovascular Exercise Tolerance: Good hypertension, (-) angina(-) Past MI and (-) DOE      Neuro/Psych  Headaches, PSYCHIATRIC DISORDERS  Neuromuscular disease negative psych ROS   GI/Hepatic Neg liver ROS, GERD  Medicated and Controlled,(+) Hepatitis -, C  Endo/Other  diabetes, Type 2, Oral Hypoglycemic Agents  Renal/GU      Musculoskeletal   Abdominal   Peds  Hematology negative hematology ROS (+)   Anesthesia Other Findings Past Medical History: No date: Anemia No date: Diabetes mellitus without complication (HCC)     Comment:  Borderline Diabetes No date: GERD (gastroesophageal reflux disease) No date: Headache     Comment:  h/o migraines No date: Hypertension No date: Neuropathy No date: Neuropathy No date: Neuropathy No date: Panic attack  Past Surgical History: 07/13/2017: COLONOSCOPY WITH PROPOFOL; N/A     Comment:  Procedure: COLONOSCOPY WITH PROPOFOL;  Surgeon:               Virgel Manifold, MD;  Location: [facility] ENDOSCOPY;                Service: Endoscopy;  Laterality: N/A; 09/14/2017: ESOPHAGOGASTRODUODENOSCOPY (EGD) WITH PROPOFOL; N/A     Comment:  Procedure: ESOPHAGOGASTRODUODENOSCOPY (EGD) WITH               PROPOFOL;  Surgeon: Virgel Manifold, MD;  Location:               [facility] ENDOSCOPY;  Service: Endoscopy;  Laterality: N/A;     Reproductive/Obstetrics negative OB ROS                              Anesthesia Physical Anesthesia Plan  ASA: III  Anesthesia Plan: General ETT   Post-op Pain Management:    Induction: Intravenous  PONV Risk Score and Plan: Ondansetron, Dexamethasone and Midazolam  Airway Management Planned: Oral ETT  Additional Equipment:   Intra-op Plan:   Post-operative Plan: Extubation in OR  Informed Consent: I have reviewed the patients History and Physical, chart, labs and discussed the procedure including the risks, benefits and alternatives for the proposed anesthesia with the patient or authorized representative who has indicated his/her understanding and acceptance.   Dental Advisory Given  Plan Discussed with: Anesthesiologist, CRNA and Surgeon  Anesthesia Plan Comments: (Patient consented for risks of anesthesia including but not limited to:  - adverse reactions to medications - damage to teeth, lips or other oral mucosa - sore throat or hoarseness - Damage to heart, brain, lungs or loss of life  Patient voiced understanding.)        Anesthesia Quick Evaluation

## 2018-02-06 NOTE — Interval H&P Note (Signed)
History and Physical Interval Note:  02/06/2018 11:46 AM  Stacy Cunningham  has presented today for surgery, with the diagnosis of CHOLELITHIASIS  The various methods of treatment have been discussed with the patient and family. After consideration of risks, benefits and other options for treatment, the patient has consented to  Procedure(s): LAPAROSCOPIC CHOLECYSTECTOMY (N/A) as a surgical intervention .  The patient's history has been reviewed, patient examined, no change in status, stable for surgery.  I have reviewed the patient's chart and labs.  Questions were answered to the patient's satisfaction.     Herbert Pun

## 2018-02-06 NOTE — Op Note (Signed)
Preoperative diagnosis: Symptomatic cholelithiasis.  Postoperative diagnosis: Symptomatic cholelithiasis..  Procedure: Laparoscopic Cholecystectomy.   Anesthesia: GETA   Surgeon: Dr. Windell Moment  Wound Classification: Clean Contaminated  Indications: Patient is a 67 y.o. female developed right upper quadrant pain, nausea and vomiting and on workup was found to have cholelithiasis with a normal common duct. Laparoscopic cholecystectomy was elected.  Findings: Critical view of safety achieved Cystic duct and artery identified, ligated and divided Adequate hemostasis  Description of procedure: The patient was placed on the operating table in the supine position. General anesthesia was induced. A time-out was completed verifying correct patient, procedure, site, positioning, and implant(s) and/or special equipment prior to beginning this procedure. An orogastric tube was placed. The abdomen was prepped and draped in the usual sterile fashion.  An incision was made in a natural skin line above the umbilicus.  The fascia was elevated and the Veress needle inserted. Proper position was confirmed by aspiration and saline meniscus test.  The abdomen was insufflated with carbon dioxide to a pressure of 15 mmHg. The patient tolerated insufflation well. A 11-mm trocar was then inserted.  The laparoscope was inserted and the abdomen inspected. No injuries from initial trocar placement were noted. Additional trocars were then inserted in the following locations: a 5-mm trocar in the right epigastrium and two 5-mm trocars along the right costal margin. The abdomen was inspected and no abnormalities were found. The table was placed in the reverse Trendelenburg position with the right side up.  Filmy adhesions between the gallbladder and omentum, duodenum and transverse colon were lysed sharply. The dome of the gallbladder was grasped with an atraumatic grasper passed through the lateral port and retracted  over the dome of the liver. The infundibulum was also grasped with an atraumatic grasper through the midclavicular port and retracted toward the right lower quadrant. This maneuver exposed Calot's triangle. The peritoneum overlying the gallbladder infundibulum was then incised and the cystic duct and cystic artery identified and circumferentially dissected. Critical view of safety reviewed before ligating any structure. The cystic duct and cystic artery were then doubly clipped and divided close to the gallbladder.  The gallbladder was then dissected from its peritoneal attachments by electrocautery. Hemostasis was checked and the gallbladder and contained stones were removed using an endoscopic retrieval bag placed through the umbilical port. The gallbladder was passed off the table as a specimen. The gallbladder fossa was copiously irrigated with saline and hemostasis was obtained. There was no evidence of bleeding from the gallbladder fossa or cystic artery or leakage of the bile from the cystic duct stump. Secondary trocars were removed under direct vision. No bleeding was noted. The laparoscope was withdrawn and the umbilical trocar removed. The abdomen was allowed to collapse. The fascia of the 47mm trocar sites was closed with figure-of-eight 0 vicryl sutures. The skin was closed with subcuticular sutures of 4-0 monocryl and topical skin adhesive. The orogastric tube was removed.  The patient tolerated the procedure well and was taken to the postanesthesia care unit in stable condition.   Specimen: Gallbladder  Complications: None  EBL: 25 mL

## 2018-02-06 NOTE — Anesthesia Postprocedure Evaluation (Signed)
Anesthesia Post Note  Patient: Janiaya Ryser  Procedure(s) Performed: LAPAROSCOPIC CHOLECYSTECTOMY (N/A Abdomen)  Patient location during evaluation: PACU Anesthesia Type: General Level of consciousness: awake and alert Pain management: pain level controlled Vital Signs Assessment: post-procedure vital signs reviewed and stable Respiratory status: spontaneous breathing, nonlabored ventilation, respiratory function stable and patient connected to nasal cannula oxygen Cardiovascular status: blood pressure returned to baseline and stable Postop Assessment: no apparent nausea or vomiting Anesthetic complications: no     Last Vitals:  Vitals:   02/06/18 1649 02/06/18 1659  BP: 120/79 122/80  Pulse: 73 72  Resp: 14 18  Temp:  (!) 35.8 C  SpO2: 95% 92%    Last Pain:  Vitals:   02/06/18 1659  TempSrc: Temporal  PainSc: 2                  Molli Barrows

## 2018-02-06 NOTE — Anesthesia Procedure Notes (Signed)

## 2018-02-06 NOTE — Anesthesia Post-op Follow-up Note (Signed)
Anesthesia QCDR form completed.        

## 2018-02-06 NOTE — Brief Op Note (Signed)
02/06/2018  3:33 PM  PATIENT:  Stacy Cunningham  67 y.o. female  PRE-OPERATIVE DIAGNOSIS:  CHOLELITHIASIS  POST-OPERATIVE DIAGNOSIS:  CHOLELITHIASIS  PROCEDURE:  Procedure(s): LAPAROSCOPIC CHOLECYSTECTOMY (N/A)  SURGEON:  Surgeon(s) and Role:    * Herbert Pun, MD - Primary  ANESTHESIA:   local and general  EBL:  25 mL

## 2018-02-06 NOTE — Discharge Instructions (Addendum)
AMBULATORY SURGERY  DISCHARGE INSTRUCTIONS   1) The drugs that you were given will stay in your system until tomorrow so for the next 24 hours you should not:  A) Drive an automobile B) Make any legal decisions C) Drink any alcoholic beverage   2) You may resume regular meals tomorrow.  Today it is better to start with liquids and gradually work up to solid foods.  You may eat anything you prefer, but it is better to start with liquids, then soup and crackers, and gradually work up to solid foods.   3) Please notify your doctor immediately if you have any unusual bleeding, trouble breathing, redness and pain at the surgery site, drainage, fever, or pain not relieved by medication.    4) Additional Instructions:        Please contact your physician with any problems or Same Day Surgery at [phone], Monday through Friday 6 am to 4 pm, or [place] [facility] number at [phone]. Laparoscopic Cholecystectomy, Care After This sheet gives you information about how to care for yourself after your procedure. Your doctor may also give you more specific instructions. If you have problems or questions, contact your doctor. Follow these instructions at home: Care for cuts from surgery (incisions)   Follow instructions from your doctor about how to take care of your cuts from surgery. Make sure you: ? Wash your hands with soap and water before you change your bandage (dressing). If you cannot use soap and water, use hand sanitizer. ? Change your bandage as told by your doctor. ? Leave stitches (sutures), skin glue, or skin tape (adhesive) strips in place. They may need to stay in place for 2 weeks or longer. If tape strips get loose and curl up, you may trim the loose edges. Do not remove tape strips completely unless your doctor says it is okay.  Do not take baths, swim, or use a hot tub until your doctor says it is okay. Ask your doctor if you can take showers. You may  only be allowed to take sponge baths for bathing.  Check your surgical cut area every day for signs of infection. Check for: ? More redness, swelling, or pain. ? More fluid or blood. ? Warmth. ? Pus or a bad smell. Activity  Do not drive or use heavy machinery while taking prescription pain medicine.  Do not lift anything that is heavier than 10 lb (4.5 kg) until your doctor says it is okay.  Do not play contact sports until your doctor says it is okay.  Do not drive for 24 hours if you were given a medicine to help you relax (sedative).  Rest as needed. Do not return to work or school until your doctor says it is okay. General instructions  Take over-the-counter and prescription medicines only as told by your doctor.  To prevent or treat constipation while you are taking prescription pain medicine, your doctor may recommend that you: ? Drink enough fluid to keep your pee (urine) clear or pale yellow. ? Take over-the-counter or prescription medicines. ? Eat foods that are high in fiber, such as fresh fruits and vegetables, whole grains, and beans. ? Limit foods that are high in fat and processed sugars, such as fried and sweet foods. Contact a doctor if:  You develop a rash.  You have more redness, swelling, or pain around your surgical cuts.  You have more fluid or blood coming from your surgical cuts.  Your surgical cuts  feel warm to the touch.  You have pus or a bad smell coming from your surgical cuts.  You have a fever.  One or more of your surgical cuts breaks open. Get help right away if:  You have trouble breathing.  You have chest pain.  You have pain that is getting worse in your shoulders.  You faint or feel dizzy when you stand.  You have very bad pain in your belly (abdomen).  You are sick to your stomach (nauseous) for more than one day.  You have throwing up (vomiting) that lasts for more than one day.  You have leg pain. This information is  not intended to replace advice given to you by your health care provider. Make sure you discuss any questions you have with your health care provider. Document Released: 01/18/2008 Document Revised: 10/30/2015 Document Reviewed: 09/27/2015 Elsevier Interactive Patient Education  2018 [employer].  Diet: Resume home heart healthy regular diet.   Activity: No heavy lifting >20 pounds (children, pets, laundry, garbage) or strenuous activity (such as sexual activity) for four weeks, but light activity and walking are encouraged. Do not drive or drink alcohol if taking narcotic pain medications.  Wound care: May shower with soapy water and pat dry (do not rub incisions), but no baths or submerging incision underwater until follow-up. (no swimming)   Medications: Resume all home medications. For mild to moderate pain: acetaminophen (Tylenol) or ibuprofen (if no kidney disease). Combining Tylenol with alcohol can substantially increase your risk of causing liver disease. Narcotic pain medications, if prescribed, can be used for severe pain, though may cause nausea, constipation, and drowsiness. Do not combine Tylenol and Norco within a 6 hour period as Norco contains Tylenol. If you do not need the narcotic pain medication, you do not need to fill the prescription.  Call office [phone]) at any time if any questions, worsening pain, fevers/chills, bleeding, drainage from incision site, or other concerns.

## 2018-02-07 ENCOUNTER — Encounter: Payer: Self-pay | Admitting: General Surgery

## 2018-02-08 ENCOUNTER — Other Ambulatory Visit: Payer: Self-pay | Admitting: General Surgery

## 2018-02-08 MED ORDER — HYDROCODONE-ACETAMINOPHEN 5-325 MG PO TABS: 1.0000 | ORAL_TABLET | ORAL | 0 refills | Status: AC | PRN

## 2018-02-09 LAB — SURGICAL PATHOLOGY

## 2018-04-27 ENCOUNTER — Emergency Department
Admission: EM | Admit: 2018-04-27 | Discharge: 2018-04-27 | Disposition: A | Discharge status: Home / Self Care | Payer: Medicare Other | Attending: Emergency Medicine | Admitting: Emergency Medicine

## 2018-04-27 ENCOUNTER — Encounter: Payer: Self-pay | Admitting: Emergency Medicine

## 2018-04-27 ENCOUNTER — Other Ambulatory Visit: Payer: Self-pay

## 2018-04-27 ENCOUNTER — Emergency Department: Payer: Medicare Other

## 2018-04-27 DIAGNOSIS — I10 Essential (primary) hypertension: Secondary | ICD-10-CM | POA: Diagnosis not present

## 2018-04-27 DIAGNOSIS — Z79899 Other long term (current) drug therapy: Secondary | ICD-10-CM | POA: Diagnosis not present

## 2018-04-27 DIAGNOSIS — E119 Type 2 diabetes mellitus without complications: Secondary | ICD-10-CM | POA: Diagnosis not present

## 2018-04-27 DIAGNOSIS — Z7984 Long term (current) use of oral hypoglycemic drugs: Secondary | ICD-10-CM | POA: Insufficient documentation

## 2018-04-27 DIAGNOSIS — Z7982 Long term (current) use of aspirin: Secondary | ICD-10-CM | POA: Diagnosis not present

## 2018-04-27 DIAGNOSIS — M25511 Pain in right shoulder: Secondary | ICD-10-CM | POA: Diagnosis present

## 2018-04-27 DIAGNOSIS — M5412 Radiculopathy, cervical region: Secondary | ICD-10-CM | POA: Insufficient documentation

## 2018-04-27 MED ORDER — KETOROLAC TROMETHAMINE 30 MG/ML IJ SOLN
30.0000 mg | Freq: Once | INTRAMUSCULAR | Status: AC
  Administered 2018-04-27: 30 mg via INTRAMUSCULAR
  Filled 2018-04-27: qty 1

## 2018-04-27 MED ORDER — BACLOFEN 10 MG PO TABS: 10.0000 mg | ORAL_TABLET | Freq: Three times a day (TID) | ORAL | 1 refills | Status: DC

## 2018-04-27 MED ORDER — PREDNISONE 10 MG (21) PO TBPK: ORAL_TABLET | ORAL | 0 refills | Status: DC

## 2018-04-27 MED ORDER — TRAMADOL HCL 50 MG PO TABS: 50.0000 mg | ORAL_TABLET | Freq: Four times a day (QID) | ORAL | 0 refills | Status: DC | PRN

## 2018-04-27 NOTE — ED Provider Notes (Signed)
[facility] Emergency Department Provider Note  ____________________________________________   First MD Initiated Contact with Patient 04/27/18 1317     (approximate)  I have reviewed the triage vital signs and the nursing notes.   HISTORY  Chief Complaint Arm Pain    HPI Stacy Cunningham is a 68 y.o. female presents emergency department with right arm pain.  She states the pain starts up in the right shoulder and radiates to the hand.  She denies any known injury.  States symptoms have been ongoing for about a month.  She is been placed on naproxen and muscle relaxers without any relief.   Patient states she has a neuropathy.  She has diabetes but is only on metformin and her glucose levels have always been pretty normal.   Past Medical History:  Diagnosis Date  . Anemia   . Diabetes mellitus without complication ([place])    Borderline Diabetes  . GERD (gastroesophageal reflux disease)   . Headache    h/o migraines  . Hypertension   . Neuropathy   . Neuropathy   . Neuropathy   . Panic attack     Patient Active Problem List   Diagnosis Date Noted  . Stomach irritation   . Abdominal pain, epigastric   . Special screening for malignant neoplasms, colon   . Diverticulosis of large intestine without diverticulitis   . Closed fracture of phalanx of foot 01/04/2017  . 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 Acute hepatitis C 06/05/2002    Past Surgical History:  Procedure Laterality Date  . CHOLECYSTECTOMY N/A 02/06/2018   Procedure: LAPAROSCOPIC CHOLECYSTECTOMY;  Surgeon: Herbert Pun, MD;  Location: [facility] ORS;  Service: General;  Laterality: N/A;  . COLONOSCOPY WITH PROPOFOL N/A 07/13/2017   Procedure: COLONOSCOPY WITH PROPOFOL;  Surgeon: Virgel Manifold, MD;  Location: [facility] ENDOSCOPY;  Service: Endoscopy;  Laterality: N/A;  . ESOPHAGOGASTRODUODENOSCOPY (EGD) WITH PROPOFOL N/A 09/14/2017   Procedure: ESOPHAGOGASTRODUODENOSCOPY (EGD) WITH PROPOFOL;   Surgeon: Virgel Manifold, MD;  Location: [facility] ENDOSCOPY;  Service: Endoscopy;  Laterality: N/A;    Prior to Admission medications   Medication Sig Start Date End Date Taking? Authorizing Provider  aspirin EC 81 MG tablet Take 81 mg by mouth daily.    [provider]  baclofen (LIORESAL) 10 MG tablet Take 1 tablet (10 mg total) by mouth 3 (three) times daily. 04/27/18 04/27/19  Daizy Outen, Linden Dolin, PA-C  famotidine (PEPCID) 40 MG tablet Take 1 tablet (40 mg total) by mouth every evening. 01/22/18 01/22/19  Orbie Pyo, MD  ferrous sulfate 325 (65 FE) MG tablet Take 325 mg by mouth daily with breakfast.    [provider]  Gabapentin (NEURONTIN PO) Take 800 mg by mouth 3 (three) times daily.     [provider]  hydrochlorothiazide (HYDRODIURIL) 25 MG tablet Take 25 mg by mouth daily.    [provider]  ibuprofen (ADVIL,MOTRIN) 800 MG tablet ibuprofen 800 mg tablet  Take 1 tablet 3 times a day by oral route.    [provider]  IRON PO iron    [provider]  lisinopril (PRINIVIL,ZESTRIL) 5 MG tablet Take 5 mg by mouth every morning.     [provider]  metFORMIN (GLUCOPHAGE) 500 MG tablet Take 500 mg by mouth. Takes 2 in am and takes 1 in pm 10/21/07   [provider]  predniSONE (STERAPRED UNI-PAK 21 TAB) 10 MG (21) TBPK tablet Take 6 pills on day one then decrease by 1 pill  each day 04/27/18   Versie Starks, PA-C  SIMVASTATIN PO Take 20 mg by mouth every evening.     [provider]  traMADol (ULTRAM) 50 MG tablet Take 1 tablet (50 mg total) by mouth every 6 (six) hours as needed. 04/27/18   Versie Starks, PA-C    Allergies Sulfa antibiotics; Tape; and Darvon [propoxyphene]  Family History  Problem Relation Age of Onset  . Breast cancer Sister 56    Social History Social History   Tobacco Use  . Smoking status: Never Smoker  . Smokeless tobacco: Never Used  Substance Use Topics  . Alcohol  use: Not Currently  . Drug use: No    Review of Systems  Constitutional: No fever/chills Eyes: No visual changes. ENT: No sore throat. Respiratory: Denies cough Genitourinary: Negative for dysuria. Musculoskeletal: Negative for back pain.  Positive right shoulder pain Skin: Negative for rash.    ____________________________________________   PHYSICAL EXAM:  VITAL SIGNS: ED Triage Vitals  Enc Vitals Group     BP 04/27/18 1241 126/87     Pulse Rate 04/27/18 1241 80     Resp 04/27/18 1241 18     Temp 04/27/18 1241 98.8 F (37.1 C)     Temp Source 04/27/18 1241 Oral     SpO2 04/27/18 1241 99 %     Weight 04/27/18 1241 189 lb (85.7 kg)     Height 04/27/18 1241 5\' 6"  (1.676 m)     Head Circumference --      Peak Flow --      Pain Score 04/27/18 1244 9     Pain Loc --      Pain Edu? --      Excl. in [place]? --     Constitutional: Alert and oriented. Well appearing and in no acute distress. Eyes: Conjunctivae are normal.  Head: Atraumatic. Nose: No congestion/rhinnorhea. Mouth/Throat: Mucous membranes are moist.   Neck:  supple no lymphadenopathy noted Cardiovascular: Normal rate, regular rhythm. Heart sounds are normal Respiratory: Normal respiratory effort.  No retractions, lungs c t a  GU: deferred Musculoskeletal: Muscles along the C-spine are tender.  Patient has tenderness along the trapezius muscle into the right arm.  Grips are equal bilaterally.  Patient has decreased range of motion of the right shoulder.  Neurologic:  Normal speech and language.  Skin:  Skin is warm, dry and intact. No rash noted. Psychiatric: Mood and affect are normal. Speech and behavior are normal.  ____________________________________________   LABS (all labs ordered are listed, but only abnormal results are displayed)  Labs Reviewed - No data to display ____________________________________________   ____________________________________________  RADIOLOGY  X-rays C-spine is  negative for any acute abnormality  ____________________________________________   PROCEDURES  Procedure(s) performed: No  Procedures    ____________________________________________   INITIAL IMPRESSION / ASSESSMENT AND PLAN / ED COURSE  Pertinent labs & imaging results that were available during my care of the patient were reviewed by me and considered in my medical decision making (see chart for details).   Patient is a 68 year old female presents emergency department complaining of pain bleeding from the right shoulder with stinging pain to the fingers.  Physical exam shows that patient is mildly tender along C-spine and right shoulder.  Ribs are equal bilaterally.  X-ray of the C-spine is negative  Explained findings to the patient.  Explained that she may actually need an MRI and should follow-up with orthopedics.  She was placed on steroid Dosepak and baclofen.  She is to also take Tylenol.  She was given tramadol also.  He states she will comply with treatment plan.  She discharged stable condition.     As part of my medical decision making, I reviewed the following data within the [place] notes reviewed and incorporated, Old chart reviewed, Radiograph reviewed x-rays C-spine is negative, Notes from prior ED visits and [place] Controlled Substance Database  ____________________________________________   FINAL CLINICAL IMPRESSION(S) / ED DIAGNOSES  Final diagnoses:  Cervical radiculopathy      NEW MEDICATIONS STARTED DURING THIS VISIT:  Discharge Medication List as of 04/27/2018  2:29 PM    START taking these medications   Details  baclofen (LIORESAL) 10 MG tablet Take 1 tablet (10 mg total) by mouth 3 (three) times daily., Starting Sat 04/27/2018, Until Sun 04/27/2019, Normal    predniSONE (STERAPRED UNI-PAK 21 TAB) 10 MG (21) TBPK tablet Take 6 pills on day one then decrease by 1 pill each day, Normal    traMADol (ULTRAM) 50 MG tablet Take  1 tablet (50 mg total) by mouth every 6 (six) hours as needed., Starting Sat 04/27/2018, Normal         Note:  This document was prepared using Dragon voice recognition software and may include unintentional dictation errors.    Versie Starks, PA-C 04/27/18 1706    Harvest Dark, MD 04/28/18 [phone]

## 2018-04-27 NOTE — ED Triage Notes (Signed)
Pt arrives with complaints of right arm pain. Pt denies injury. Pt reports OTC medication usage. Pt states she was seen by her PCP who prescribed naproxen but pt denies any relief. Pt states the pain is worse with movement. Pt states the pain has been present for over a month.

## 2018-04-27 NOTE — Discharge Instructions (Addendum)
Take the medications as prescribed.  Apply ice to the right shoulder and side of the neck.  Return emergency department worsening.  Please call [facility] clinic orthopedics for an appointment.

## 2018-05-07 ENCOUNTER — Other Ambulatory Visit (HOSPITAL_COMMUNITY): Payer: Self-pay | Admitting: Family Medicine

## 2018-05-07 ENCOUNTER — Other Ambulatory Visit: Payer: Self-pay | Admitting: Family Medicine

## 2018-05-07 DIAGNOSIS — G609 Hereditary and idiopathic neuropathy, unspecified: Secondary | ICD-10-CM

## 2018-05-13 ENCOUNTER — Ambulatory Visit: Payer: Medicare Other

## 2018-05-20 ENCOUNTER — Ambulatory Visit: Payer: Medicare Other

## 2018-05-21 ENCOUNTER — Ambulatory Visit
Admission: RE | Admit: 2018-05-21 | Discharge: 2018-05-21 | Disposition: A | Discharge status: Home / Self Care | Payer: Medicare Other | Source: Ambulatory Visit | Attending: Family Medicine | Admitting: Family Medicine

## 2018-05-21 ENCOUNTER — Ambulatory Visit: Payer: Medicare Other

## 2018-05-21 DIAGNOSIS — G609 Hereditary and idiopathic neuropathy, unspecified: Secondary | ICD-10-CM

## 2018-05-21 MED ORDER — GADOBUTROL 1 MMOL/ML IV SOLN
10.0000 mL | Freq: Once | INTRAVENOUS | Status: AC | PRN
  Administered 2018-05-21: 8 mL via INTRAVENOUS

## 2018-06-05 ENCOUNTER — Emergency Department
Admission: EM | Admit: 2018-06-05 | Discharge: 2018-06-05 | Disposition: A | Discharge status: Home / Self Care | Payer: Medicare Other | Attending: Emergency Medicine | Admitting: Emergency Medicine

## 2018-06-05 ENCOUNTER — Encounter: Payer: Self-pay | Admitting: Emergency Medicine

## 2018-06-05 ENCOUNTER — Other Ambulatory Visit: Payer: Self-pay

## 2018-06-05 ENCOUNTER — Emergency Department: Payer: Medicare Other

## 2018-06-05 DIAGNOSIS — I10 Essential (primary) hypertension: Secondary | ICD-10-CM | POA: Insufficient documentation

## 2018-06-05 DIAGNOSIS — E119 Type 2 diabetes mellitus without complications: Secondary | ICD-10-CM | POA: Insufficient documentation

## 2018-06-05 DIAGNOSIS — R1013 Epigastric pain: Secondary | ICD-10-CM | POA: Diagnosis present

## 2018-06-05 DIAGNOSIS — Z79899 Other long term (current) drug therapy: Secondary | ICD-10-CM | POA: Insufficient documentation

## 2018-06-05 DIAGNOSIS — Z7982 Long term (current) use of aspirin: Secondary | ICD-10-CM | POA: Diagnosis not present

## 2018-06-05 LAB — COMPREHENSIVE METABOLIC PANEL
ALT: 38 U/L (ref 0–44)
AST: 70 U/L — ABNORMAL HIGH (ref 15–41)
Albumin: 3.9 g/dL (ref 3.5–5.0)
Alkaline Phosphatase: 57 U/L (ref 38–126)
Anion gap: 7 (ref 5–15)
BUN: 14 mg/dL (ref 8–23)
CO2: 29 mmol/L (ref 22–32)
Calcium: 9.3 mg/dL (ref 8.9–10.3)
Chloride: 102 mmol/L (ref 98–111)
Creatinine, Ser: 0.61 mg/dL (ref 0.44–1.00)
GFR calc Af Amer: >60 mL/min (ref >60)
GFR calc non Af Amer: >60 mL/min (ref >60)
Glucose, Bld: 122 mg/dL — ABNORMAL HIGH (ref 70–99)
POTASSIUM: 4.5 mmol/L (ref 3.5–5.1)
Sodium: 138 mmol/L (ref 135–145)
Total Bilirubin: 0.5 mg/dL (ref 0.3–1.2)
Total Protein: 7 g/dL (ref 6.5–8.1)

## 2018-06-05 LAB — TROPONIN I: Troponin I: <0.03 ng/mL (ref <0.03)

## 2018-06-05 LAB — CBC WITH DIFFERENTIAL/PLATELET
ABS IMMATURE GRANULOCYTES: 0.05 10*3/uL (ref 0.00–0.07)
Basophils Absolute: 0.1 10*3/uL (ref 0.0–0.1)
Basophils Relative: 1 %
Eosinophils Absolute: 0.1 10*3/uL (ref 0.0–0.5)
Eosinophils Relative: 1 %
HCT: 34.3 % — ABNORMAL LOW (ref 36.0–46.0)
HEMOGLOBIN: 10.4 g/dL — AB (ref 12.0–15.0)
Immature Granulocytes: 1 %
LYMPHS ABS: 2.4 10*3/uL (ref 0.7–4.0)
Lymphocytes Relative: 27 %
MCH: 22.1 pg — ABNORMAL LOW (ref 26.0–34.0)
MCHC: 30.3 g/dL (ref 30.0–36.0)
MCV: 73 fL — ABNORMAL LOW (ref 80.0–100.0)
Monocytes Absolute: 0.5 10*3/uL (ref 0.1–1.0)
Monocytes Relative: 6 %
Neutro Abs: 5.9 10*3/uL (ref 1.7–7.7)
Neutrophils Relative %: 64 %
Platelets: 240 10*3/uL (ref 150–400)
RBC: 4.7 MIL/uL (ref 3.87–5.11)
RDW: 15.5 % (ref 11.5–15.5)
WBC: 9 10*3/uL (ref 4.0–10.5)
nRBC: 0 % (ref 0.0–0.2)

## 2018-06-05 LAB — LIPASE, BLOOD: Lipase: 47 U/L (ref 11–51)

## 2018-06-05 MED ORDER — SODIUM CHLORIDE 0.9 % IV BOLUS
500.0000 mL | Freq: Once | INTRAVENOUS | Status: AC
  Administered 2018-06-05: 500 mL via INTRAVENOUS

## 2018-06-05 MED ORDER — HYDROCODONE-ACETAMINOPHEN 5-325 MG PO TABS: 1.0000 | ORAL_TABLET | Freq: Four times a day (QID) | ORAL | 0 refills | Status: DC | PRN

## 2018-06-05 MED ORDER — HYDROCODONE-ACETAMINOPHEN 5-325 MG PO TABS
1.0000 | ORAL_TABLET | Freq: Once | ORAL | Status: AC
  Administered 2018-06-05: 1 via ORAL
  Filled 2018-06-05: qty 1

## 2018-06-05 MED ORDER — FAMOTIDINE IN NACL 20-0.9 MG/50ML-% IV SOLN
20.0000 mg | Freq: Once | INTRAVENOUS | Status: AC
  Administered 2018-06-05: 20 mg via INTRAVENOUS
  Filled 2018-06-05: qty 50

## 2018-06-05 NOTE — Discharge Instructions (Addendum)
Change your pain medicine to Norco as needed for neck pain.  Drink plenty of fluids daily.  Return to the ER for worsening symptoms, persistent vomiting, difficulty breathing or other concerns.

## 2018-06-05 NOTE — ED Provider Notes (Signed)
[facility] Emergency Department Provider Note   ____________________________________________   First MD Initiated Contact with Patient 06/05/18 0533     (approximate)  I have reviewed the triage vital signs and the nursing notes.   HISTORY  Chief Complaint Abdominal Pain    HPI Stacy Cunningham is a 68 y.o. female who presents to the ED from home with a chief complaint of epigastric pain.  Patient was seen at [facility] clinic yesterday and prescribed Tylenol 3 for cervical radiculopathy.  She was also prescribed a steroid but has not yet taken it.  Patient took Tylenol 3 and subsequently began to have pain in her epigastrium associated with sweating and nausea.  Denies associated fever, chills, chest pain, shortness of breath, vomiting, diarrhea.  Last BM yesterday which was normal for patient.  Denies recent travel or trauma.    Past Medical History:  Diagnosis Date  . Anemia   . Diabetes mellitus without complication ([place])    Borderline Diabetes  . GERD (gastroesophageal reflux disease)   . Headache    h/o migraines  . Hypertension   . Neuropathy   . Neuropathy   . Neuropathy   . Panic attack     Patient Active Problem List   Diagnosis Date Noted  . Stomach irritation   . Abdominal pain, epigastric   . Special screening for malignant neoplasms, colon   . Diverticulosis of large intestine without diverticulitis   . Closed fracture of phalanx of foot 01/04/2017  . 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 Acute hepatitis C 06/05/2002    Past Surgical History:  Procedure Laterality Date  . CHOLECYSTECTOMY N/A 02/06/2018   Procedure: LAPAROSCOPIC CHOLECYSTECTOMY;  Surgeon: Herbert Pun, MD;  Location: [facility] ORS;  Service: General;  Laterality: N/A;  . COLONOSCOPY WITH PROPOFOL N/A 07/13/2017   Procedure: COLONOSCOPY WITH PROPOFOL;  Surgeon: Virgel Manifold, MD;  Location: [facility] ENDOSCOPY;  Service: Endoscopy;  Laterality: N/A;  .  ESOPHAGOGASTRODUODENOSCOPY (EGD) WITH PROPOFOL N/A 09/14/2017   Procedure: ESOPHAGOGASTRODUODENOSCOPY (EGD) WITH PROPOFOL;  Surgeon: Virgel Manifold, MD;  Location: [facility] ENDOSCOPY;  Service: Endoscopy;  Laterality: N/A;    Prior to Admission medications   Medication Sig Start Date End Date Taking? Authorizing Provider  aspirin EC 81 MG tablet Take 81 mg by mouth daily.    [provider]  baclofen (LIORESAL) 10 MG tablet Take 1 tablet (10 mg total) by mouth 3 (three) times daily. 04/27/18 04/27/19  Fisher, Linden Dolin, PA-C  famotidine (PEPCID) 40 MG tablet Take 1 tablet (40 mg total) by mouth every evening. 01/22/18 01/22/19  Orbie Pyo, MD  ferrous sulfate 325 (65 FE) MG tablet Take 325 mg by mouth daily with breakfast.    [provider]  Gabapentin (NEURONTIN PO) Take 800 mg by mouth 3 (three) times daily.     [provider]  hydrochlorothiazide (HYDRODIURIL) 25 MG tablet Take 25 mg by mouth daily.    [provider]  HYDROcodone-acetaminophen (NORCO) 5-325 MG tablet Take 1 tablet by mouth every 6 (six) hours as needed for moderate pain. 06/05/18   Paulette Blanch, MD  ibuprofen (ADVIL,MOTRIN) 800 MG tablet ibuprofen 800 mg tablet  Take 1 tablet 3 times a day by oral route.    [provider]  IRON PO iron    [provider]  lisinopril (PRINIVIL,ZESTRIL) 5 MG tablet Take 5 mg by mouth every morning.     [provider]  metFORMIN (GLUCOPHAGE) 500 MG tablet Take 500 mg  by mouth. Takes 2 in am and takes 1 in pm 10/21/07   [provider]  predniSONE (STERAPRED UNI-PAK 21 TAB) 10 MG (21) TBPK tablet Take 6 pills on day one then decrease by 1 pill each day 04/27/18   Versie Starks, PA-C  SIMVASTATIN PO Take 20 mg by mouth every evening.     [provider]  traMADol (ULTRAM) 50 MG tablet Take 1 tablet (50 mg total) by mouth every 6 (six) hours as needed. 04/27/18   Versie Starks, PA-C    Allergies Sulfa  antibiotics; Tape; and Darvon [propoxyphene]  Family History  Problem Relation Age of Onset  . Breast cancer Sister 18    Social History Social History   Tobacco Use  . Smoking status: Never Smoker  . Smokeless tobacco: Never Used  Substance Use Topics  . Alcohol use: Not Currently  . Drug use: No    Review of Systems  Constitutional: No fever/chills Eyes: No visual changes. ENT: No sore throat. Cardiovascular: Denies chest pain. Respiratory: Denies shortness of breath. Gastrointestinal: Positive for abdominal pain and nausea, no vomiting.  No diarrhea.  No constipation. Genitourinary: Negative for dysuria. Musculoskeletal: Negative for back pain. Skin: Negative for rash. Neurological: Negative for headaches, focal weakness or numbness.   ____________________________________________   PHYSICAL EXAM:  VITAL SIGNS: ED Triage Vitals  Enc Vitals Group     BP 06/05/18 0507 118/72     Pulse Rate 06/05/18 0507 85     Resp 06/05/18 0507 18     Temp 06/05/18 0507 98 F (36.7 C)     Temp Source 06/05/18 0507 Oral     SpO2 06/05/18 0507 100 %     Weight 06/05/18 0508 189 lb (85.7 kg)     Height 06/05/18 0508 5\' 6"  (1.676 m)     Head Circumference --      Peak Flow --      Pain Score 06/05/18 0506 6     Pain Loc --      Pain Edu? --      Excl. in [place]? --     Constitutional: Alert and oriented. Well appearing and in no acute distress. Eyes: Conjunctivae are normal. PERRL. EOMI. Head: Atraumatic. Nose: No congestion/rhinnorhea. Mouth/Throat: Mucous membranes are moist.  Oropharynx non-erythematous. Neck: No stridor.   Cardiovascular: Normal rate, regular rhythm. Grossly normal heart sounds.  Good peripheral circulation. Respiratory: Normal respiratory effort.  No retractions. Lungs CTAB. Gastrointestinal: Soft and nontender to light or deep palpation. No distention. No abdominal bruits. No CVA tenderness. Musculoskeletal: No lower extremity tenderness nor edema.  No  joint effusions. Neurologic:  Normal speech and language. No gross focal neurologic deficits are appreciated. No gait instability. Skin:  Skin is warm, dry and intact. No rash noted. Psychiatric: Mood and affect are normal. Speech and behavior are normal.  ____________________________________________   LABS (all labs ordered are listed, but only abnormal results are displayed)  Labs Reviewed  CBC WITH DIFFERENTIAL/PLATELET - Abnormal; Notable for the following components:      Result Value   Hemoglobin 10.4 (*)    HCT 34.3 (*)    MCV 73.0 (*)    MCH 22.1 (*)    All other components within normal limits  COMPREHENSIVE METABOLIC PANEL - Abnormal; Notable for the following components:   Glucose, Bld 122 (*)    AST 70 (*)    All other components within normal limits  LIPASE, BLOOD  TROPONIN I   ____________________________________________  EKG  ED ECG REPORT I, Leslye Puccini J, the attending physician, personally viewed and interpreted this ECG.   Date: 06/05/2018  EKG Time: 0514  Rate: 73  Rhythm: normal EKG, normal sinus rhythm  Axis: Normal  Intervals:none  ST&T Change: Nonspecific  ____________________________________________  RADIOLOGY  ED MD interpretation: No acute cardiopulmonary process  Official radiology report(s): Dg Chest 2 View  Result Date: 06/05/2018 CLINICAL DATA:  Epigastric pain EXAM: CHEST - 2 VIEW COMPARISON:  None. FINDINGS: Normal heart size and mediastinal contours. No acute infiltrate or edema. No effusion or pneumothorax. Spondylosis. No acute osseous findings. IMPRESSION: No evidence of acute disease. Electronically Signed   By: Monte Fantasia M.D.   On: 06/05/2018 06:13    ____________________________________________   PROCEDURES  Procedure(s) performed: None  Procedures  Critical Care performed: No  ____________________________________________   INITIAL IMPRESSION / ASSESSMENT AND PLAN / ED COURSE  As part of my medical  decision making, I reviewed the following data within the electronic MEDICAL RECORD NUMBER History obtained from family, Nursing notes reviewed and incorporated, Labs reviewed, EKG interpreted, Radiograph reviewed and Notes from prior ED visits    68 year old female who presents with epigastric pain after taking Tylenol 3. Differential diagnosis includes, but is not limited to, biliary disease (biliary colic, acute cholecystitis, cholangitis, choledocholithiasis, etc), intrathoracic causes for epigastric abdominal pain including ACS, gastritis, duodenitis, pancreatitis, small bowel or large bowel obstruction, abdominal aortic aneurysm, hernia, and ulcer(s).  Will obtain screening lab work including troponin, administer IV Pepcid, chest x-ray and reassess.   Clinical Course as of Jun 05 649  Wed Jun 05, 2018  0649 Patient feeling better.  Updated patient and spouse on all test results.  Will change Tylenol 3 to Norco.  Also advised patient and spouse that her sugars will temporarily increase on the prednisone.  Strict return precautions given.  Both verbalized understanding agree with plan of care.   [JS]    Clinical Course User Index [JS] Paulette Blanch, MD     ____________________________________________   FINAL CLINICAL IMPRESSION(S) / ED DIAGNOSES  Final diagnoses:  Epigastric pain     ED Discharge Orders         Ordered    HYDROcodone-acetaminophen (NORCO) 5-325 MG tablet  Every 6 hours PRN     06/05/18 0650           Note:  This document was prepared using Dragon voice recognition software and may include unintentional dictation errors.   Paulette Blanch, MD 06/05/18 [phone]

## 2018-06-05 NOTE — ED Triage Notes (Signed)
Patient ambulatory to triage with steady gait, without difficulty or distress noted; pt reports upper abd pain & diaphoresis and nausea; seen at [facility] yesterday and rx pain med & steroid for cervical radiculopathy; st symptoms began after taking med; st symptoms worsen when lying supine

## 2018-06-12 ENCOUNTER — Other Ambulatory Visit: Payer: Self-pay | Admitting: Neurosurgery

## 2018-06-12 DIAGNOSIS — M5412 Radiculopathy, cervical region: Secondary | ICD-10-CM

## 2018-06-19 ENCOUNTER — Ambulatory Visit
Admission: RE | Admit: 2018-06-19 | Discharge: 2018-06-19 | Disposition: A | Discharge status: Home / Self Care | Payer: Medicare Other | Source: Ambulatory Visit | Attending: Neurosurgery | Admitting: Neurosurgery

## 2018-06-19 ENCOUNTER — Other Ambulatory Visit: Payer: Self-pay | Admitting: Neurosurgery

## 2018-06-19 DIAGNOSIS — M5412 Radiculopathy, cervical region: Secondary | ICD-10-CM

## 2018-06-19 MED ORDER — IOPAMIDOL (ISOVUE-M 300) INJECTION 61%
1.0000 mL | Freq: Once | INTRAMUSCULAR | Status: AC
  Administered 2018-06-19: 1 mL via EPIDURAL

## 2018-06-19 MED ORDER — TRIAMCINOLONE ACETONIDE 40 MG/ML IJ SUSP (RADIOLOGY)
60.0000 mg | Freq: Once | INTRAMUSCULAR | Status: AC
  Administered 2018-06-19: 60 mg via EPIDURAL

## 2018-06-19 NOTE — Discharge Instructions (Signed)

## 2018-08-07 ENCOUNTER — Other Ambulatory Visit: Payer: Self-pay | Admitting: Neurosurgery

## 2018-08-07 DIAGNOSIS — M5412 Radiculopathy, cervical region: Secondary | ICD-10-CM

## 2018-09-18 ENCOUNTER — Ambulatory Visit
Admission: RE | Admit: 2018-09-18 | Discharge: 2018-09-18 | Disposition: A | Discharge status: Home / Self Care | Payer: Medicare Other | Source: Ambulatory Visit | Attending: Neurosurgery | Admitting: Neurosurgery

## 2018-09-18 ENCOUNTER — Encounter
Admission: RE | Admit: 2018-09-18 | Discharge: 2018-09-18 | Disposition: A | Discharge status: Home / Self Care | Payer: Medicare Other | Source: Ambulatory Visit | Attending: Neurosurgery | Admitting: Neurosurgery

## 2018-09-18 ENCOUNTER — Other Ambulatory Visit: Payer: Self-pay

## 2018-09-18 DIAGNOSIS — Z01818 Encounter for other preprocedural examination: Secondary | ICD-10-CM

## 2018-09-18 HISTORY — DX: Hyperlipidemia, unspecified: E78.5

## 2018-09-18 HISTORY — DX: Unspecified glaucoma: H40.9

## 2018-09-18 LAB — TYPE AND SCREEN
ABO/RH(D): O POS
Antibody Screen: NEGATIVE

## 2018-09-18 LAB — BASIC METABOLIC PANEL
Anion gap: 9 (ref 5–15)
BUN: 14 mg/dL (ref 8–23)
CO2: 28 mmol/L (ref 22–32)
Calcium: 8.8 mg/dL — ABNORMAL LOW (ref 8.9–10.3)
Chloride: 102 mmol/L (ref 98–111)
Creatinine, Ser: 0.68 mg/dL (ref 0.44–1.00)
GFR calc Af Amer: >60 mL/min (ref >60)
GFR calc non Af Amer: >60 mL/min (ref >60)
Glucose, Bld: 102 mg/dL — ABNORMAL HIGH (ref 70–99)
Potassium: 3.8 mmol/L (ref 3.5–5.1)
Sodium: 139 mmol/L (ref 135–145)

## 2018-09-18 LAB — APTT: aPTT: 34 seconds (ref 24–36)

## 2018-09-18 LAB — URINALYSIS, COMPLETE (UACMP) WITH MICROSCOPIC
Bacteria, UA: NONE SEEN
Bilirubin Urine: NEGATIVE
Glucose, UA: NEGATIVE mg/dL
Hgb urine dipstick: NEGATIVE
Ketones, ur: NEGATIVE mg/dL
Leukocytes,Ua: NEGATIVE
Nitrite: NEGATIVE
Protein, ur: NEGATIVE mg/dL
Specific Gravity, Urine: 1.008 (ref 1.005–1.030)
pH: 5 (ref 5.0–8.0)

## 2018-09-18 LAB — CBC
HCT: 34 % — ABNORMAL LOW (ref 36.0–46.0)
Hemoglobin: 10.5 g/dL — ABNORMAL LOW (ref 12.0–15.0)
MCH: 22.6 pg — ABNORMAL LOW (ref 26.0–34.0)
MCHC: 30.9 g/dL (ref 30.0–36.0)
MCV: 73.1 fL — ABNORMAL LOW (ref 80.0–100.0)
Platelets: 238 10*3/uL (ref 150–400)
RBC: 4.65 MIL/uL (ref 3.87–5.11)
RDW: 15.5 % (ref 11.5–15.5)
WBC: 6.1 10*3/uL (ref 4.0–10.5)
nRBC: 0 % (ref 0.0–0.2)

## 2018-09-18 LAB — PROTIME-INR
INR: 0.9 (ref 0.8–1.2)
Prothrombin Time: 12.5 seconds (ref 11.4–15.2)

## 2018-09-18 LAB — SURGICAL PCR SCREEN
MRSA, PCR: NEGATIVE
Staphylococcus aureus: NEGATIVE

## 2018-09-18 NOTE — Patient Instructions (Signed)
Your procedure is scheduled on: 09/30/2018 Mon Report to Same Day Surgery 2nd floor medical mall [facility] Entrance-take elevator on left to 2nd floor.  Check in with surgery information desk.) To find out your arrival time please call [phone] between 1PM - 3PM on 09/27/2018 Fri  Remember: Instructions that are not followed completely may result in serious medical risk, up to and including death, or upon the discretion of your surgeon and anesthesiologist your surgery may need to be rescheduled.    _x___ 1. Do not eat food after midnight the night before your procedure. You may drink clear liquids up to 2 hours before you are scheduled to arrive at the hospital for your procedure.  Do not drink clear liquids within 2 hours of your scheduled arrival to the hospital.  Clear liquids include  --Water or Apple juice without pulp  --Clear carbohydrate beverage such as ClearFast or Gatorade  --Black Coffee or Clear Tea (No milk, no creamers, do not add anything to                  the coffee or Tea Type 1 and type 2 diabetics should only drink water.   ____Ensure clear carbohydrate drink on the way to the hospital for bariatric patients  ____Ensure clear carbohydrate drink 3 hours before surgery for Dr Dwyane Luo patients if physician instructed.   No gum chewing or hard candies.     __x__ 2. No Alcohol for 24 hours before or after surgery.   __x__3. No Smoking or e-cigarettes for 24 prior to surgery.  Do not use any chewable tobacco products for at least 6 hour prior to surgery   ____  4. Bring all medications with you on the day of surgery if instructed.    __x__ 5. Notify your doctor if there is any change in your medical condition     (cold, fever, infections).    x___6. On the morning of surgery brush your teeth with toothpaste and water.  You may rinse your mouth with mouth wash if you wish.  Do not swallow any toothpaste or mouthwash.   Do not wear jewelry, make-up, hairpins,  clips or nail polish.  Do not wear lotions, powders, or perfumes. You may wear deodorant.  Do not shave 48 hours prior to surgery. Men may shave face and neck.  Do not bring valuables to the hospital.    [facility] is not responsible for any belongings or valuables.               Contacts, dentures or bridgework may not be worn into surgery.  Leave your suitcase in the car. After surgery it may be brought to your room.  For patients admitted to the hospital, discharge time is determined by your                       treatment team.  _  Patients discharged the day of surgery will not be allowed to drive home.  You will need someone to drive you home and stay with you the night of your procedure.    Please read over the following fact sheets that you were given:   [facility] Preparing for Surgery and or MRSA Information   _x___ Take anti-hypertensive listed below, cardiac, seizure, asthma,     anti-reflux and psychiatric medicines. These include:  1. famotidine (PEPCID  2.gabapentin (NEURONTIN  3.timolol (BETIMOL  4.  5.  6.  ____Fleets enema or Magnesium  Citrate as directed.   _x___ Use CHG Soap or sage wipes as directed on instruction sheet   ____ Use inhalers on the day of surgery and bring to hospital day of surgery  __x__ Stop Metformin and Janumet 2 days prior to surgery.    ____ Take 1/2 of usual insulin dose the night before surgery and none on the morning     surgery.   _x___ Follow recommendations from Cardiologist, Pulmonologist or PCP regarding          stopping Aspirin, Coumadin, Plavix ,Eliquis, Effient, or Pradaxa, and Pletal.  X____Stop Anti-inflammatories such as Advil, Aleve, Ibuprofen, Motrin, Naproxen, Naprosyn, Goodies powders or aspirin products. OK to take Tylenol and                          Celebrex.   _x___ Stop supplements until after surgery.  But may continue Vitamin D, Vitamin B,       and multivitamin.   ____ Bring C-Pap to the hospital.

## 2018-09-26 ENCOUNTER — Other Ambulatory Visit
Admission: RE | Admit: 2018-09-26 | Discharge: 2018-09-26 | Disposition: A | Discharge status: Home / Self Care | Payer: Medicare Other | Source: Ambulatory Visit | Attending: Neurosurgery | Admitting: Neurosurgery

## 2018-09-26 ENCOUNTER — Other Ambulatory Visit: Payer: Self-pay

## 2018-09-26 DIAGNOSIS — Z1159 Encounter for screening for other viral diseases: Secondary | ICD-10-CM | POA: Insufficient documentation

## 2018-09-27 LAB — NOVEL CORONAVIRUS, NAA (HOSP ORDER, SEND-OUT TO REF LAB; TAT 18-24 HRS): SARS-CoV-2, NAA: NOT DETECTED

## 2018-09-30 ENCOUNTER — Other Ambulatory Visit: Payer: Self-pay

## 2018-09-30 ENCOUNTER — Observation Stay
Admission: RE | Admit: 2018-09-30 | Discharge: 2018-10-01 | Disposition: A | Discharge status: Home / Self Care | Payer: Medicare Other | Attending: Neurosurgery | Admitting: Neurosurgery

## 2018-09-30 ENCOUNTER — Encounter
Admission: RE | Disposition: A | Discharge status: Home / Self Care | Payer: Self-pay | Source: Home / Self Care | Attending: Neurosurgery

## 2018-09-30 ENCOUNTER — Ambulatory Visit: Payer: Medicare Other | Admitting: Registered Nurse

## 2018-09-30 ENCOUNTER — Ambulatory Visit: Payer: Medicare Other

## 2018-09-30 DIAGNOSIS — D649 Anemia, unspecified: Secondary | ICD-10-CM | POA: Diagnosis not present

## 2018-09-30 DIAGNOSIS — Z87891 Personal history of nicotine dependence: Secondary | ICD-10-CM | POA: Diagnosis not present

## 2018-09-30 DIAGNOSIS — M50122 Cervical disc disorder at C5-C6 level with radiculopathy: Secondary | ICD-10-CM | POA: Diagnosis not present

## 2018-09-30 DIAGNOSIS — I10 Essential (primary) hypertension: Secondary | ICD-10-CM | POA: Diagnosis not present

## 2018-09-30 DIAGNOSIS — F419 Anxiety disorder, unspecified: Secondary | ICD-10-CM | POA: Diagnosis not present

## 2018-09-30 DIAGNOSIS — K219 Gastro-esophageal reflux disease without esophagitis: Secondary | ICD-10-CM | POA: Diagnosis not present

## 2018-09-30 DIAGNOSIS — M5412 Radiculopathy, cervical region: Secondary | ICD-10-CM | POA: Diagnosis present

## 2018-09-30 DIAGNOSIS — M4802 Spinal stenosis, cervical region: Secondary | ICD-10-CM | POA: Insufficient documentation

## 2018-09-30 DIAGNOSIS — E785 Hyperlipidemia, unspecified: Secondary | ICD-10-CM | POA: Diagnosis not present

## 2018-09-30 DIAGNOSIS — E118 Type 2 diabetes mellitus with unspecified complications: Secondary | ICD-10-CM | POA: Diagnosis not present

## 2018-09-30 DIAGNOSIS — Z981 Arthrodesis status: Secondary | ICD-10-CM

## 2018-09-30 DIAGNOSIS — G8929 Other chronic pain: Secondary | ICD-10-CM | POA: Diagnosis present

## 2018-09-30 DIAGNOSIS — R51 Headache: Secondary | ICD-10-CM | POA: Insufficient documentation

## 2018-09-30 DIAGNOSIS — Z419 Encounter for procedure for purposes other than remedying health state, unspecified: Secondary | ICD-10-CM

## 2018-09-30 HISTORY — PX: ANTERIOR CERVICAL DECOMP/DISCECTOMY FUSION: SHX1161

## 2018-09-30 LAB — ABO/RH: ABO/RH(D): O POS

## 2018-09-30 LAB — GLUCOSE, CAPILLARY
Glucose-Capillary: 93 mg/dL (ref 70–99)
Glucose-Capillary: 93 mg/dL (ref 70–99)
Glucose-Capillary: 135 mg/dL — ABNORMAL HIGH (ref 70–99)
Glucose-Capillary: 155 mg/dL — ABNORMAL HIGH (ref 70–99)

## 2018-09-30 SURGERY — ANTERIOR CERVICAL DECOMPRESSION/DISCECTOMY FUSION 1 LEVEL
Anesthesia: General

## 2018-09-30 MED ORDER — MENTHOL 3 MG MT LOZG
1.0000 | LOZENGE | OROMUCOSAL | Status: DC | PRN
  Filled 2018-09-30: qty 9

## 2018-09-30 MED ORDER — SODIUM CHLORIDE 0.9% FLUSH
3.0000 mL | Freq: Two times a day (BID) | INTRAVENOUS | Status: DC
  Administered 2018-10-01: 3 mL via INTRAVENOUS

## 2018-09-30 MED ORDER — DEXAMETHASONE SODIUM PHOSPHATE 10 MG/ML IJ SOLN
INTRAMUSCULAR | Status: AC
  Filled 2018-09-30: qty 1

## 2018-09-30 MED ORDER — ONDANSETRON HCL 4 MG PO TABS: 4.0000 mg | ORAL_TABLET | Freq: Four times a day (QID) | ORAL | Status: DC | PRN

## 2018-09-30 MED ORDER — HYDROMORPHONE HCL 1 MG/ML IJ SOLN
INTRAMUSCULAR | Status: AC
  Administered 2018-09-30: 14:00:00 0.25 mg via INTRAVENOUS
  Filled 2018-09-30: qty 1

## 2018-09-30 MED ORDER — INSULIN ASPART 100 UNIT/ML ~~LOC~~ SOLN
0.0000 [IU] | Freq: Three times a day (TID) | SUBCUTANEOUS | Status: DC
  Filled 2018-09-30: qty 1

## 2018-09-30 MED ORDER — ACETAMINOPHEN 325 MG PO TABS: 650.0000 mg | ORAL_TABLET | ORAL | Status: DC | PRN

## 2018-09-30 MED ORDER — SODIUM CHLORIDE 0.9 % IV SOLN: INTRAVENOUS | Status: DC

## 2018-09-30 MED ORDER — SODIUM CHLORIDE 0.9 % IV SOLN: 250.0000 mL | INTRAVENOUS | Status: DC

## 2018-09-30 MED ORDER — HYDROMORPHONE HCL 1 MG/ML IJ SOLN: 0.5000 mg | INTRAMUSCULAR | Status: DC | PRN

## 2018-09-30 MED ORDER — FENTANYL CITRATE (PF) 100 MCG/2ML IJ SOLN
INTRAMUSCULAR | Status: DC | PRN
  Administered 2018-09-30 (×2): 50 ug via INTRAVENOUS

## 2018-09-30 MED ORDER — OXYCODONE HCL 5 MG PO TABS: 10.0000 mg | ORAL_TABLET | ORAL | Status: DC | PRN

## 2018-09-30 MED ORDER — SIMVASTATIN 20 MG PO TABS
20.0000 mg | ORAL_TABLET | Freq: Every evening | ORAL | Status: DC
  Administered 2018-09-30: 20 mg via ORAL
  Filled 2018-09-30: qty 1

## 2018-09-30 MED ORDER — ACETAMINOPHEN 500 MG PO TABS
1000.0000 mg | ORAL_TABLET | Freq: Four times a day (QID) | ORAL | Status: DC
  Administered 2018-09-30 – 2018-10-01 (×3): 1000 mg via ORAL
  Filled 2018-09-30 (×3): qty 2

## 2018-09-30 MED ORDER — LIDOCAINE-EPINEPHRINE 1 %-1:100000 IJ SOLN
INTRAMUSCULAR | Status: DC | PRN
  Administered 2018-09-30: 3 mL

## 2018-09-30 MED ORDER — SODIUM CHLORIDE 0.9% FLUSH: 3.0000 mL | INTRAVENOUS | Status: DC | PRN

## 2018-09-30 MED ORDER — SODIUM CHLORIDE 0.9 % IR SOLN
Status: DC | PRN
  Administered 2018-09-30: 1000 mL

## 2018-09-30 MED ORDER — LIDOCAINE HCL (CARDIAC) PF 100 MG/5ML IV SOSY
PREFILLED_SYRINGE | INTRAVENOUS | Status: DC | PRN
  Administered 2018-09-30: 80 mg via INTRAVENOUS

## 2018-09-30 MED ORDER — SENNA 8.6 MG PO TABS
1.0000 | ORAL_TABLET | Freq: Two times a day (BID) | ORAL | Status: DC
  Administered 2018-09-30 – 2018-10-01 (×2): 8.6 mg via ORAL
  Filled 2018-09-30 (×2): qty 1

## 2018-09-30 MED ORDER — ALUM & MAG HYDROXIDE-SIMETH 200-200-20 MG/5ML PO SUSP
30.0000 mL | Freq: Four times a day (QID) | ORAL | Status: DC | PRN
  Administered 2018-09-30: 30 mL via ORAL
  Filled 2018-09-30: qty 30

## 2018-09-30 MED ORDER — FERROUS SULFATE 325 (65 FE) MG PO TABS
325.0000 mg | ORAL_TABLET | Freq: Two times a day (BID) | ORAL | Status: DC
  Administered 2018-09-30 – 2018-10-01 (×2): 325 mg via ORAL
  Filled 2018-09-30 (×2): qty 1

## 2018-09-30 MED ORDER — FAMOTIDINE 20 MG PO TABS: 40.0000 mg | ORAL_TABLET | Freq: Every evening | ORAL | Status: DC

## 2018-09-30 MED ORDER — ONDANSETRON HCL 4 MG/2ML IJ SOLN
INTRAMUSCULAR | Status: DC | PRN
  Administered 2018-09-30: 4 mg via INTRAVENOUS

## 2018-09-30 MED ORDER — CEFAZOLIN SODIUM-DEXTROSE 2-4 GM/100ML-% IV SOLN
2.0000 g | Freq: Once | INTRAVENOUS | Status: AC
  Administered 2018-09-30: 11:00:00 2 g via INTRAVENOUS

## 2018-09-30 MED ORDER — MIDAZOLAM HCL 2 MG/2ML IJ SOLN
INTRAMUSCULAR | Status: AC
  Filled 2018-09-30: qty 2

## 2018-09-30 MED ORDER — METHOCARBAMOL 500 MG PO TABS
500.0000 mg | ORAL_TABLET | Freq: Four times a day (QID) | ORAL | Status: DC
  Administered 2018-09-30 – 2018-10-01 (×3): 500 mg via ORAL
  Filled 2018-09-30 (×3): qty 1

## 2018-09-30 MED ORDER — LACTATED RINGERS IV SOLN
INTRAVENOUS | Status: DC | PRN
  Administered 2018-09-30: 11:00:00 via INTRAVENOUS

## 2018-09-30 MED ORDER — HYDROMORPHONE HCL 1 MG/ML IJ SOLN
INTRAMUSCULAR | Status: AC
  Administered 2018-09-30: 0.25 mg via INTRAVENOUS
  Filled 2018-09-30: qty 1

## 2018-09-30 MED ORDER — PHENOL 1.4 % MT LIQD
1.0000 | OROMUCOSAL | Status: DC | PRN
  Filled 2018-09-30: qty 177

## 2018-09-30 MED ORDER — SUCCINYLCHOLINE CHLORIDE 20 MG/ML IJ SOLN
INTRAMUSCULAR | Status: DC | PRN
  Administered 2018-09-30: 100 mg via INTRAVENOUS

## 2018-09-30 MED ORDER — CEFAZOLIN SODIUM-DEXTROSE 2-4 GM/100ML-% IV SOLN
INTRAVENOUS | Status: AC
  Filled 2018-09-30: qty 100

## 2018-09-30 MED ORDER — CHOLESTYRAMINE 4 G PO PACK
4.0000 g | PACK | Freq: Every day | ORAL | Status: DC
  Filled 2018-09-30 (×2): qty 1

## 2018-09-30 MED ORDER — METFORMIN HCL 500 MG PO TABS
500.0000 mg | ORAL_TABLET | Freq: Every day | ORAL | Status: DC
  Administered 2018-09-30: 500 mg via ORAL
  Filled 2018-09-30: qty 1

## 2018-09-30 MED ORDER — METFORMIN HCL 500 MG PO TABS
1000.0000 mg | ORAL_TABLET | Freq: Every day | ORAL | Status: DC
  Administered 2018-10-01: 1000 mg via ORAL
  Filled 2018-09-30: qty 2

## 2018-09-30 MED ORDER — BISACODYL 5 MG PO TBEC: 5.0000 mg | DELAYED_RELEASE_TABLET | Freq: Every day | ORAL | Status: DC | PRN

## 2018-09-30 MED ORDER — SODIUM CHLORIDE 0.9 % IV SOLN
INTRAVENOUS | Status: DC
  Administered 2018-09-30 – 2018-10-01 (×2): via INTRAVENOUS

## 2018-09-30 MED ORDER — PROPOFOL 10 MG/ML IV BOLUS
INTRAVENOUS | Status: DC | PRN
  Administered 2018-09-30: 140 mg via INTRAVENOUS

## 2018-09-30 MED ORDER — ONDANSETRON HCL 4 MG/2ML IJ SOLN: 4.0000 mg | Freq: Four times a day (QID) | INTRAMUSCULAR | Status: DC | PRN

## 2018-09-30 MED ORDER — DEXAMETHASONE SODIUM PHOSPHATE 10 MG/ML IJ SOLN
INTRAMUSCULAR | Status: DC | PRN
  Administered 2018-09-30: 10 mg via INTRAVENOUS

## 2018-09-30 MED ORDER — SUGAMMADEX SODIUM 200 MG/2ML IV SOLN
INTRAVENOUS | Status: DC | PRN
  Administered 2018-09-30: 160 mg via INTRAVENOUS

## 2018-09-30 MED ORDER — GABAPENTIN 400 MG PO CAPS
800.0000 mg | ORAL_CAPSULE | Freq: Three times a day (TID) | ORAL | Status: DC
  Administered 2018-09-30 – 2018-10-01 (×3): 800 mg via ORAL
  Filled 2018-09-30 (×3): qty 2

## 2018-09-30 MED ORDER — FAMOTIDINE 20 MG PO TABS
20.0000 mg | ORAL_TABLET | Freq: Two times a day (BID) | ORAL | Status: DC
  Administered 2018-09-30 – 2018-10-01 (×2): 20 mg via ORAL
  Filled 2018-09-30 (×2): qty 1

## 2018-09-30 MED ORDER — METHOCARBAMOL 1000 MG/10ML IJ SOLN
500.0000 mg | Freq: Four times a day (QID) | INTRAVENOUS | Status: DC
  Filled 2018-09-30: qty 5

## 2018-09-30 MED ORDER — HYDROMORPHONE HCL 1 MG/ML IJ SOLN
0.2500 mg | INTRAMUSCULAR | Status: DC | PRN
  Administered 2018-09-30 (×6): 0.25 mg via INTRAVENOUS

## 2018-09-30 MED ORDER — OXYCODONE HCL 5 MG PO TABS
5.0000 mg | ORAL_TABLET | ORAL | Status: DC | PRN
  Administered 2018-09-30 – 2018-10-01 (×4): 5 mg via ORAL
  Filled 2018-09-30 (×4): qty 1

## 2018-09-30 MED ORDER — HYDROCHLOROTHIAZIDE 25 MG PO TABS
25.0000 mg | ORAL_TABLET | Freq: Every day | ORAL | Status: DC
  Administered 2018-10-01: 25 mg via ORAL
  Filled 2018-09-30 (×2): qty 1

## 2018-09-30 MED ORDER — MAGNESIUM CITRATE PO SOLN
1.0000 | Freq: Once | ORAL | Status: DC | PRN
  Filled 2018-09-30: qty 296

## 2018-09-30 MED ORDER — LISINOPRIL 5 MG PO TABS
5.0000 mg | ORAL_TABLET | ORAL | Status: DC
  Administered 2018-10-01: 09:00:00 5 mg via ORAL
  Filled 2018-09-30: qty 1

## 2018-09-30 MED ORDER — FENTANYL CITRATE (PF) 100 MCG/2ML IJ SOLN
INTRAMUSCULAR | Status: AC
  Filled 2018-09-30: qty 2

## 2018-09-30 MED ORDER — POLYETHYLENE GLYCOL 3350 17 G PO PACK: 17.0000 g | PACK | Freq: Every day | ORAL | Status: DC | PRN

## 2018-09-30 MED ORDER — TIMOLOL MALEATE 0.5 % OP SOLN
1.0000 [drp] | Freq: Two times a day (BID) | OPHTHALMIC | Status: DC
  Administered 2018-09-30 – 2018-10-01 (×2): 1 [drp] via OPHTHALMIC
  Filled 2018-09-30: qty 5

## 2018-09-30 MED ORDER — ACETAMINOPHEN 650 MG RE SUPP: 650.0000 mg | RECTAL | Status: DC | PRN

## 2018-09-30 MED ORDER — ROCURONIUM BROMIDE 100 MG/10ML IV SOLN
INTRAVENOUS | Status: DC | PRN
  Administered 2018-09-30: 20 mg via INTRAVENOUS

## 2018-09-30 MED ORDER — THROMBIN 5000 UNITS EX SOLR
CUTANEOUS | Status: DC | PRN
  Administered 2018-09-30: 5000 [IU] via TOPICAL

## 2018-09-30 SURGICAL SUPPLY — 62 items
BIT DRILL 13 (BIT) ×2 IMPLANT
BLADE BOVIE TIP EXT 4 (BLADE) ×2 IMPLANT
BLADE SURG 15 STRL LF DISP TIS (BLADE) ×1 IMPLANT
BLADE SURG 15 STRL SS (BLADE) ×1
BONE WEDGE CONERSTONE 7X14X11 (Bone Implant) ×2 IMPLANT
BUR DIAMOND COARSE 4.0 RND (BURR) ×2 IMPLANT
BUR NEURO DRILL SOFT 3.0X3.8M (BURR) ×2 IMPLANT
CANISTER SUCT 1200ML W/VALVE (MISCELLANEOUS) ×2 IMPLANT
CHLORAPREP W/TINT 26 (MISCELLANEOUS) ×4 IMPLANT
COLLAR CERV MED MED DENS 3 (SOFTGOODS) IMPLANT
COLLAR CERV SM MED DENS 3 (SOFTGOODS) IMPLANT
COLLAR CERV XL MED DENS 3 (SOFTGOODS) IMPLANT
COUNTER NEEDLE 20/40 LG (NEEDLE) ×2 IMPLANT
COVER LIGHT HANDLE STERIS (MISCELLANEOUS) ×4 IMPLANT
COVER WAND RF STERILE (DRAPES) ×2 IMPLANT
CRADLE LAMINECT ARM (MISCELLANEOUS) ×2 IMPLANT
CUP MEDICINE 2OZ PLAST GRAD ST (MISCELLANEOUS) ×4 IMPLANT
DERMABOND ADVANCED (GAUZE/BANDAGES/DRESSINGS) ×1
DERMABOND ADVANCED .7 DNX12 (GAUZE/BANDAGES/DRESSINGS) ×1 IMPLANT
DRAPE C-ARM 42X72 X-RAY (DRAPES) ×4 IMPLANT
DRAPE MICROSCOPE SPINE 48X150 (DRAPES) ×2 IMPLANT
DRAPE SURG 17X11 SM STRL (DRAPES) ×4 IMPLANT
DRAPE THYROID T SHEET (DRAPES) ×2 IMPLANT
ELECT CAUTERY BLADE TIP 2.5 (TIP) ×2
ELECT EZSTD 165MM 6.5IN (MISCELLANEOUS) ×2
ELECTRODE CAUTERY BLDE TIP 2.5 (TIP) ×1 IMPLANT
ELECTRODE EZSTD 165MM 6.5IN (MISCELLANEOUS) ×1 IMPLANT
FEE INTRAOP MONITOR IMPULS NCS (MISCELLANEOUS) IMPLANT
GAUZE SPONGE 4X4 12PLY STRL (GAUZE/BANDAGES/DRESSINGS) ×2 IMPLANT
GLOVE INDICATOR 7.0 STRL GRN (GLOVE) ×2 IMPLANT
GLOVE INDICATOR 8.0 STRL GRN (GLOVE) ×2 IMPLANT
GLOVE SURG SYN 7.0 (GLOVE) ×4 IMPLANT
GLOVE SURG SYN 8.0 (GLOVE) ×4 IMPLANT
GOWN STRL REUS W/ TWL XL LVL3 (GOWN DISPOSABLE) ×1 IMPLANT
GOWN STRL REUS W/TWL MED LVL3 (GOWN DISPOSABLE) ×2 IMPLANT
GOWN STRL REUS W/TWL XL LVL3 (GOWN DISPOSABLE) ×1
GRADUATE 1200CC STRL 31836 (MISCELLANEOUS) ×2 IMPLANT
INTRAOP MONITOR FEE IMPULS NCS (MISCELLANEOUS)
INTRAOP MONITOR FEE IMPULSE (MISCELLANEOUS)
IV CATH ANGIO 12GX3 LT BLUE (NEEDLE) ×2 IMPLANT
KIT TURNOVER KIT A (KITS) ×2 IMPLANT
MARKER SKIN DUAL TIP RULER LAB (MISCELLANEOUS) ×4 IMPLANT
NEEDLE HYPO 22GX1.5 SAFETY (NEEDLE) ×2 IMPLANT
NEEDLE SPNL 22GX3.5 QUINCKE BK (NEEDLE) ×2 IMPLANT
NS IRRIG 1000ML POUR BTL (IV SOLUTION) ×2 IMPLANT
PACK LAMINECTOMY NEURO (CUSTOM PROCEDURE TRAY) ×2 IMPLANT
PIN CASPAR 14 (PIN) ×1 IMPLANT
PIN CASPAR 14MM (PIN) ×2
PLATE ZEVO 1LVL 19MM (Plate) ×2 IMPLANT
PUTTY DBF 1CC CORTICAL FIBERS (Putty) ×2 IMPLANT
SCREW 13MM (Screw) ×8 IMPLANT
SPOGE SURGIFLO 8M (HEMOSTASIS) ×1
SPONGE KITTNER 5P (MISCELLANEOUS) ×2 IMPLANT
SPONGE SURGIFLO 8M (HEMOSTASIS) ×1 IMPLANT
SUT POLYSORB 2-0 5X18 GS-10 (SUTURE) ×4 IMPLANT
SUT VICRYL 2-0 SH 8X27 (SUTURE) ×2 IMPLANT
SUT VICRYL 3-0 CR8 SH (SUTURE) ×2 IMPLANT
SYR 30ML LL (SYRINGE) ×2 IMPLANT
TAPE CLOTH 3X10 WHT NS LF (GAUZE/BANDAGES/DRESSINGS) ×2 IMPLANT
TOWEL OR 17X26 4PK STRL BLUE (TOWEL DISPOSABLE) ×4 IMPLANT
TRAY FOLEY MTR SLVR 16FR STAT (SET/KITS/TRAYS/PACK) IMPLANT
TUBING CONNECTING 10 (TUBING) ×2 IMPLANT

## 2018-09-30 NOTE — Progress Notes (Signed)
Procedure: C5-6 ACDF Procedure date: 09/30/2018 Diagnosis: Cervical radiculopathy  History: Stacy Cunningham is s/p C5-6 ACDF for cervical radiculopathy  POD0: Tolerated procedure well without complication.  Evaluated in postop recovery, still disoriented from anesthesia but able to answer questions and obey commands.  Complains of 10/10 pain diffusely through clavicular area and shoulders bilaterally.  Denies any upper extremity pain that she was having prior to surgery.  Denies any new pain/numbness/tingling in upper or lower extremities.  Physical Exam: Vitals:   09/30/18 1030 09/30/18 1342  BP: 135/80 132/86  Pulse: 75 78  Resp: 16 17  Temp: (!) 96.9 F (36.1 C) 98.6 F (37 C)  SpO2: 100% 97%   Strength:5/5 throughout upper and lower extremities Sensation: Intact and symmetric throughout upper and lower extremities Skin: Glue intact at incision site  Data:  No results for input(s): NA, K, CL, CO2, BUN, CREATININE, LABGLOM, GLUCOSE, CALCIUM in the last 168 hours. No results for input(s): AST, ALT, ALKPHOS in the last 168 hours.  Invalid input(s): TBILI   No results for input(s): WBC, HGB, HCT, PLT in the last 168 hours. No results for input(s): APTT, INR in the last 168 hours.       Other tests/results: Cervical x-rays pending  Assessment/Plan:  Stacy Cunningham is POD 0 status post C5-6 ACDF for cervical radiculopathy.  Will continue to monitor.  - mobilize - pain control - DVT prophylaxis  Marin Olp PA-C Department of Neurosurgery

## 2018-09-30 NOTE — Transfer of Care (Signed)
Immediate Anesthesia Transfer of Care Note  Patient: Stacy Cunningham  Procedure(s) Performed: ANTERIOR CERVICAL DECOMPRESSION/DISCECTOMY FUSION 1 LEVEL - C 5/6 (N/A )  Patient Location: PACU  Anesthesia Type:General  Level of Consciousness: awake and alert   Airway & Oxygen Therapy: Patient Spontanous Breathing and Patient connected to face mask oxygen  Post-op Assessment: Report given to RN  Post vital signs: Reviewed and stable  Last Vitals:  Vitals Value Taken Time  BP 132/86 09/30/2018  1:42 PM  Temp 37 C 09/30/2018  1:42 PM  Pulse 73 09/30/2018  1:42 PM  Resp 14 09/30/2018  1:42 PM  SpO2 100 % 09/30/2018  1:42 PM  Vitals shown include unvalidated device data.  Last Pain:  Vitals:   09/30/18 1030  TempSrc: Temporal         Complications: No apparent anesthesia complications

## 2018-09-30 NOTE — Anesthesia Preprocedure Evaluation (Addendum)
Anesthesia Evaluation  Patient identified by MRN, date of birth, ID band Patient awake    Reviewed: Allergy & Precautions, H&P , NPO status , Patient's Chart, lab work & pertinent test results  Airway Mallampati: III  TM Distance: >3 FB Neck ROM: limited   Comment: Pain with neck extension Dental  (+) Missing, Partial Upper   Pulmonary neg pulmonary ROS,           Cardiovascular hypertension, On Medications      Neuro/Psych  Headaches, PSYCHIATRIC DISORDERS Anxiety Cervical neuropathy    GI/Hepatic GERD  Controlled,(+) Hepatitis -, C  Endo/Other  diabetes  Renal/GU      Musculoskeletal   Abdominal   Peds  Hematology  (+) Blood dyscrasia, anemia ,   Anesthesia Other Findings Past Medical History: No date: Anemia No date: Diabetes mellitus without complication (HCC)     Comment:  Borderline Diabetes No date: Elevated lipids No date: GERD (gastroesophageal reflux disease) No date: Glaucoma No date: Headache     Comment:  h/o migraines No date: Hypertension No date: Neuropathy No date: Neuropathy No date: Neuropathy No date: Panic attack  Past Surgical History: 02/06/2018: CHOLECYSTECTOMY; N/A     Comment:  Procedure: LAPAROSCOPIC CHOLECYSTECTOMY;  Surgeon:               Herbert Pun, MD;  Location: [facility] ORS;  Service:              General;  Laterality: N/A; 07/13/2017: COLONOSCOPY WITH PROPOFOL; N/A     Comment:  Procedure: COLONOSCOPY WITH PROPOFOL;  Surgeon:               Virgel Manifold, MD;  Location: [facility] ENDOSCOPY;                Service: Endoscopy;  Laterality: N/A; 09/14/2017: ESOPHAGOGASTRODUODENOSCOPY (EGD) WITH PROPOFOL; N/A     Comment:  Procedure: ESOPHAGOGASTRODUODENOSCOPY (EGD) WITH               PROPOFOL;  Surgeon: Virgel Manifold, MD;  Location:               [facility] ENDOSCOPY;  Service: Endoscopy;  Laterality: N/A;  BMI    Body Mass Index:  30.18 kg/m      Reproductive/Obstetrics negative OB ROS                            Anesthesia Physical Anesthesia Plan  ASA: II  Anesthesia Plan: General ETT   Post-op Pain Management:    Induction:   PONV Risk Score and Plan: Ondansetron, Dexamethasone, Midazolam and Treatment may vary due to age or medical condition  Airway Management Planned: Video Laryngoscope Planned  Additional Equipment:   Intra-op Plan:   Post-operative Plan:   Informed Consent: I have reviewed the patients History and Physical, chart, labs and discussed the procedure including the risks, benefits and alternatives for the proposed anesthesia with the patient or authorized representative who has indicated his/her understanding and acceptance.     Dental Advisory Given  Plan Discussed with: Anesthesiologist and CRNA  Anesthesia Plan Comments:        Anesthesia Quick Evaluation

## 2018-09-30 NOTE — H&P (Signed)
History & Physical   Date of Service: 09/30/2018    Time:  10:00AM  Chief Complaint: Right arm pain  History of Present Illness: Stacy Cunningham is here today to discuss ongoing symptoms of right arm pain. She states this started back in November last year with pain going down the neck to the shoulder and bicep area. This is now extended down to the hand into the thumb. She has been put on muscle relaxant and nortriptyline, she does not feel these are helping. She does not recall any steroid pills. She has not had any injections or seeing physical therapy. She does feel things are more difficult to perform given the pain with movement of that right arm. She does not endorse any obvious numbness or weakness. She denies any left arm pain. She does not recall similar pain in the past.  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 GERD (gastroesophageal reflux disease)   . Hyperlipidemia   . Hypertension    Past Surgical History:  Procedure Laterality Date  . cyst removal from spine     "about 50 years ago"  . 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 KNEE ARTHROSCOPY Right 07/2011  . Pin put in right hand index finger     Family History  Problem Relation Age of Onset  . No Known Problems Mother   . No Known Problems Father    Social History   Social History  . Marital status: Single    Spouse name: N/A  . Number of children: N/A  . Years of education: N/A   Social History Main Topics  . Smoking status: Former Smoker    Quit date: 07/23/1954  . Smokeless tobacco: Never Used  . Alcohol use 0.0 oz/week  . Drug use: No  . Sexual activity: Not Asked   Other Topics Concern  . None   Social History Narrative  . None     No Known Allergies  Medications: Cannot display prior to admission medications because the patient has not been admitted in this  contact.    Review of Systems:  General ROS: Negative Psychological ROS: Negative Ophthalmic ROS: Negative ENT ROS: Negative Hematological and Lymphatic ROS: Negative  Endocrine ROS: Negative Respiratory ROS: Negative Cardiovascular ROS: Negative Gastrointestinal ROS: Negative Genito-Urinary ROS: Negative Musculoskeletal ROS: Positive for neck pain Neurological ROS: Positive for right arm pain Dermatological ROS: Negative  Physical Exam:    Weight: 62.2 kg (137 lb 3.2 oz)  General appearance: Alert, cooperative, in no acute distress Head: Normocephalic, atraumatic Eyes: Normal, EOM intact Oropharynx: Moist without lesions CV: regular rate Pulm: Clear to auscultation Neck: Supple, full range of motion, some pain elicited on extension in the right arm. Ext: No edema in LE bilaterally, warm extremities  Neurologic exam:  Mental status: alertness: alert, affect: normal Speech: fluent and clear Motor:strength symmetric 5/5 in bilateral upper extremities with exception of some pain limitation in right bicep and wrist extension Sensory: intact to light touch in bilateral upper extremities Reflexes: 2+ and symmetric bilaterally for biceps Gait: normal    Data: MRI cervical spine: There is a normal lordotic curvature. There is minimal degenerative disease noted. There is a normal alignment. There is a mild disc bulge eccentric to the left at C3-4. There is a moderate disc bulge eccentric to the right at C5-6 resulting in stenosis. There  are no other significant levels of stenosis noted.   Assessment & Plan:  Active Problems:   Right C6 radiculopathy   1. Plan for C5/6 ACDF today  VTE Prophylaxis: SCDs  Code Status: Full Code   Malen Gauze, MD 09/30/2018

## 2018-09-30 NOTE — Anesthesia Procedure Notes (Signed)
Procedure Name: Intubation Date/Time: 09/30/2018 11:30 AM Performed by: Lesle Reek, CRNA Pre-anesthesia Checklist: Emergency Drugs available, Suction available, Patient being monitored, Timeout performed and Patient identified Patient Re-evaluated:Patient Re-evaluated prior to induction Oxygen Delivery Method: Circle system utilized Preoxygenation: Pre-oxygenation with 100% oxygen Induction Type: IV induction Ventilation: Mask ventilation without difficulty Laryngoscope Size: McGraph and 4 Grade View: Grade I Tube type: Oral Tube size: 7.0 mm Number of attempts: 1 Airway Equipment and Method: Stylet Placement Confirmation: ETT inserted through vocal cords under direct vision,  positive ETCO2,  CO2 detector and breath sounds checked- equal and bilateral Secured at: 22 cm Tube secured with: Tape

## 2018-09-30 NOTE — Anesthesia Post-op Follow-up Note (Signed)
Anesthesia QCDR form completed.        

## 2018-09-30 NOTE — Progress Notes (Signed)
Pt admitted to room 158, in no distress at this time. VSS. Tolerating diet. Pt denies extremity numbness or tingling. Can move and has full sensation to all extremities. Pt is due to void. No adventitious heart or lung sounds. Will continue to monitor.

## 2018-09-30 NOTE — Op Note (Signed)
Operative Note6/11/2018  PRE-OP DIAGNOSIS:  Cervical Radiculopathy[M54.12] * Herniated nucleus pulposus, cervical [M50.20]  POST-OP DIAGNOSIS:Post-Op Diagnosis Codes: Cervical Radiculopathy[M54.12] * Herniated nucleus pulposus, cervical [M50.20]   Procedure(s): 1.ARTHRODESIS, ANTERIOR INTERBODY, INCLUDING DISC SPACE PREPARATION, DISCECTOMY, OSTEOPHYTECTOMY AND DECOMPRESSION OF SPINAL CORD AND/ORNERVE ROOTS; CERVICAL BELOW C2--C5/6ACDF 2.ANTERIOR INSTRUMENTATION; 2 TO 3 VERTEBRAL SEGMENTS (LIST IN ADDITION TO PRIMARY PROCEDURE) 3.ALLOGRAFT, STRUCTURAL, FOR SPINE SURGERY ONLY (LIST IN ADDITION TO PRIMARY PROCEDURE)  SURGEON: Surgeon(s) and Role: * Malen Gauze, MD - Primary  Marin Olp, PA, Asisstant  ANESTHESIA:General   OPERATIVE FINDINGS: Stenosis atC5/6  OPERATIVE REPORT:   Indications Stacy CunninghamShe had failed conservative management of prescription medications, oral steroids, and exercise. She had a MRI that showed disc herniation causing stenosis on the right at C5/6.Given this, we recommended an anterior cervical decompression and fusion to relieve the pressure on thespinal cord and allow for healing.The risks of hematoma, infection, poor bone healing and failure of fusion, cord injury, weakness, numbness, neck pain, stroke, and death were discussed in detail. All questions were answered and the patient elected to proceed with the surgery.  Procedure After obtaining informed consent, the patient was taken to the Operating Room where general anesthesia was induced and the patient intubated. Vascular access was obtained. Decadronand antibioticswereadministered. The head was slightly extended and imaging used to identify a skin crease overlying the C5vertebral body. Appropriate padding was performed.   The patient was prepped and draped in the usual  sterile fashion and a timeout was performed per protocol. Local anesthesia was instilled with epinephrine along the planned incision site. A transverse cervicalincision was performed on the left in a skin crease. The incision was carried to the level of the platysma and then cautery was used to incise the muscle. Blunt dissection was used to expand the plane and the dissection was carried deep medial to the SCM and carotid sheath being careful to identify the trachea and esophagus medially. The prevertebral fascia was identified and this was bluntly dissected to expose the disc spaces. A needle was placed in the disc space and x-ray confirmed the C5/6disc level.   Next, cautery was used to undermine the longus colli muscles bilaterally and identify the C5/6disc space. Caspar pins were placed at [facility] a retractor system placed under the muscles to complete the exposure. Next, a combination of curettes and Kerrison rongeurs were used to remove the anterior osteophyte at C5/6and then the disc material. A 68mm matchstick was used to shave the endplates of the adjacent bodies. A trial spacer was used to size the graft and then hemostasis obtained.  The microscope was brought into the field for the remainder of the surgery. The matchsitickdrill bitwas used to remove the osteophyte/disc complex deep to the level of the PLL at C5/6. There was disc protrusion at this level that was removed with rongeurs.There was an obvious disc fragment on the right.The PLL was entered with a hook and then the PLL was removed along with remaining disc material to decompress centrally and then out into bilateral neuro foramen. A blunt probe was used to confirm no residual stenosis laterally and a curette used to ensure no posterior osteophyte remained. Floseal was used for hemostasis. Allograft was placed, 57mm in height and placed slightly recessed to theanterior edge of vertebral bodyto promote arthrodesis.  The  caspar pins were removed and bone wax placed. The remainder of the osteophytes were removedto allow for  plating. Next, a90mm - one levelplate was found to be the adequate size and was placed in the midline and secured with two 11mm screws at each bone level. Xray was obtained confirming good graft placement and adequate depth of screws. The retractors were removed. The wound was irrigated copiously and hemostasis obtained. The platysma was closed with 2-0 vicryl suture. The dermis was closed with 3-0 Vicryl and Dermabond was placed on the skin.  The patient had general anesthesia reversed and was extubated following the procedure. She awoke following commands with symmetric movement. She was taken to the PACU where she continued recovery and then the ward.   ESTIMATED BLOOD LOSS:20cc   SPECIMENS:None    IMPLANT PUTTY DBF 1CC CORTICAL FIBERS - [id number]   Inventory Item: PUTTY DBF 1CC CORTICAL FIBERS Serial no.: [id number] Model/Cat no.: [id number]  Implant name: PUTTY [facility] CORTICAL FIBERS - [id number] Laterality: N/A Area: Cervical Level 5-6  Manufacturer: [employer] Date of Manufacture:    Action: Implanted Number Used: 1   Device Identifier:  Device Identifier Type:    BONE WEDGE CONERSTONE [id number] - [id number]   Inventory Item: BONE WEDGE CONERSTONE [id number] Serial no.: [id number] Model/Cat no.: [id number]  Implant name: [place] [id number] - [id number] Laterality: N/A Area: Cervical Level 5-6  Manufacturer: [employer] Date of Manufacture:    Action: Implanted Number Used: 1   Device Identifier:  Device Identifier Type:    SCREW 13MM - [id number]   Inventory Item: SCREW 13MM Serial no.:  Model/Cat no.: [id number]  Implant name: SCREW 13MM - [id number] Laterality: N/A Area: Cervical Level 5-6  Manufacturer: [employer] Date of Manufacture:    Action: Implanted Number Used: 4   Device Identifier:  Device Identifier Type:    PLATE ZEVO 1LVL 94HW -  [id number]   Inventory Item: PLATE ZEVO 1LVL 49ZP Serial no.:  Model/Cat no.: [id number]  Implant name: PLATE ZEVO 1LVL 91TA - [id number] Laterality: N/A Area: Cervical Level 5-6  Manufacturer: [employer] Date of Manufacture:    Action: Implanted Number Used: 1   Device Identifier:  Device Identifier Type:      ATTESTATION: I performed the procedure in its entirety with the assistance of Marin Olp, physician assistant  Deetta Perla, [phone]

## 2018-09-30 NOTE — Anesthesia Postprocedure Evaluation (Signed)
Anesthesia Post Note  Patient: Stacy Cunningham  Procedure(s) Performed: ANTERIOR CERVICAL DECOMPRESSION/DISCECTOMY FUSION 1 LEVEL - C 5/6 (N/A )  Patient location during evaluation: PACU Anesthesia Type: General Level of consciousness: awake and alert Pain management: pain level controlled Vital Signs Assessment: post-procedure vital signs reviewed and stable Respiratory status: spontaneous breathing, nonlabored ventilation and respiratory function stable Cardiovascular status: blood pressure returned to baseline and stable Postop Assessment: no apparent nausea or vomiting Anesthetic complications: no     Last Vitals:  Vitals:   09/30/18 1355 09/30/18 1410  BP: 136/90 134/87  Pulse: 74 79  Resp: 15 13  Temp:    SpO2: 98% 92%    Last Pain:  Vitals:   09/30/18 1410  TempSrc:   PainSc: [place]

## 2018-09-30 NOTE — Interval H&P Note (Signed)
History and Physical Interval Note:  09/30/2018 10:42 AM  Stacy Cunningham  has presented today for surgery, with the diagnosis of CERVICAL RADICULOPATHY.  The various methods of treatment have been discussed with the patient and family. After consideration of risks, benefits and other options for treatment, the patient has consented to  Procedure(s): ANTERIOR CERVICAL DECOMPRESSION/DISCECTOMY FUSION 1 LEVEL - C 5/6 (N/A) as a surgical intervention.  The patient's history has been reviewed, patient examined, no change in status, stable for surgery.  I have reviewed the patient's chart and labs.  Questions were answered to the patient's satisfaction.     Deetta Perla

## 2018-10-01 ENCOUNTER — Observation Stay: Payer: Medicare Other

## 2018-10-01 ENCOUNTER — Encounter: Payer: Self-pay | Admitting: Student

## 2018-10-01 ENCOUNTER — Encounter: Payer: Self-pay | Admitting: Neurosurgery

## 2018-10-01 DIAGNOSIS — M50122 Cervical disc disorder at C5-C6 level with radiculopathy: Secondary | ICD-10-CM | POA: Diagnosis not present

## 2018-10-01 LAB — GLUCOSE, CAPILLARY: Glucose-Capillary: 123 mg/dL — ABNORMAL HIGH (ref 70–99)

## 2018-10-01 MED ORDER — HYDROCODONE-ACETAMINOPHEN 5-325 MG PO TABS: 1.0000 | ORAL_TABLET | ORAL | 0 refills | Status: AC | PRN

## 2018-10-01 MED ORDER — BACLOFEN 10 MG PO TABS: 10.0000 mg | ORAL_TABLET | Freq: Four times a day (QID) | ORAL | 0 refills | Status: AC | PRN

## 2018-10-01 NOTE — Progress Notes (Signed)
Patient is alert and oriented and able to verbalize needs. No complaints of pain at this time. VSS. PIVs removed. Printed AVS, work note and Rx for [employer] given to patient in discharge packet. All instructions gone over with patient. No questions or concerns. All belongings packed. Patient called husband for transportation home.  Bethann Punches, RN

## 2018-10-01 NOTE — Progress Notes (Signed)
error 

## 2018-10-01 NOTE — Discharge Instructions (Addendum)
Your surgeon has performed an operation on your cervical spine (neck) to relieve pressure on the spinal cord and/or nerves. This involved making an incision in the front of your neck and removing one or more of the discs that support your spine. Next, a small piece of bone, a titanium plate, and screws were used to fuse two or more of the vertebrae (bones) together.  The following are instructions to help in your recovery once you have been discharged from the hospital. Even if you feel well, it is important that you follow these activity guidelines. If you do not let your neck heal properly from the surgery, you can increase the chance of return of your symptoms and other complications.  * Do not take anti-inflammatory medications for 3 months after surgery (naproxen [Aleve], ibuprofen [Advil, Motrin], celecoxib [Celebrex], etc.). These medications can prevent your bones from healing properly. * You may resume aspirin after 7 days   Activity    No bending, lifting, or twisting (BLT). Avoid lifting objects heavier than 10 pounds (gallon milk jug).  Where possible, avoid household activities that involve lifting, bending, reaching, pushing, or pulling such as laundry, vacuuming, grocery shopping, and childcare. Try to arrange for help from friends and family for these activities while your back heals.  Increase physical activity slowly as tolerated.  Taking short walks is encouraged, but avoid strenuous exercise. Do not jog, run, bicycle, lift weights, or participate in any other exercises unless specifically allowed by your doctor.  Talk to your doctor before resuming sexual activity.  You should not drive until cleared by your doctor.  Until released by your doctor, you should not return to work or school.  You should rest at home and let your body heal.   You may shower three days after your surgery.  After showering, lightly dab your incision dry. Do not take a tub bath or go swimming until  approved by your doctor at your follow-up appointment.  If your doctor ordered a cervical collar (neck brace) for you, you should wear it whenever you are out of bed. You may remove it when lying down or sleeping, but you should wear it at all other times. Not all neck surgeries require a cervical collar.  If you smoke, we strongly recommend that you quit.  Smoking has been proven to interfere with normal bone healing and will dramatically reduce the success rate of your surgery. Please contact [facility] (800-QUIT-NOW) and use the resources at www.[facility].com for assistance in stopping smoking.  Surgical Incision   If you have a dressing on your incision, you may remove it two days after your surgery. Keep your incision area clean and dry.  If you have staples or stitches on your incision, you should have a follow up scheduled for removal. If you do not have staples or stitches, you will have steri-strips (small pieces of surgical tape) or Dermabond glue. The steri-strips/glue should begin to peel away within about a week (it is fine if the steri-strips fall off before then). If the strips are still in place one week after your surgery, you may gently remove them.  Diet           You may return to your usual diet. However, you may experience discomfort when swallowing in the first month after your surgery. This is normal. You may find that softer foods are more comfortable for you to swallow. Be sure to stay hydrated.  When to Contact us  You may experience  pain in your neck and/or pain between your shoulder blades. This is normal and should improve in the next few weeks with the help of pain medication, muscle relaxers, and rest. Some patients report that a warm compress on the back of the neck or between the shoulder blades helps.  However, should you experience any of the following, contact us immediately:  New numbness or weakness  Pain that is progressively getting worse, and is not  relieved by your pain medication, muscle relaxers, rest, and warm compresses  Bleeding, redness, swelling, pain, or drainage from surgical incision  Chills or flu-like symptoms  Fever greater than 101.0 F (38.3 C)  Inability to eat, drink fluids, or take medications  Problems with bowel or bladder functions  Difficulty breathing or shortness of breath  Warmth, tenderness, or swelling in your calf Contact Information  During office hours (Monday-Friday 9 am to 5 pm), please call your physician at [phone] and ask for Berdine Addison  After hours and weekends, please call the [place] Operator at [phone] and ask for the Neurosurgery Resident On Call   For a life-threatening emergency, call 911

## 2018-10-01 NOTE — Discharge Summary (Signed)
Procedure: C5-6 ACDF Procedure date: 09/30/2018 Diagnosis: Cervical radiculopathy  History: Stacy Cunningham is s/p C5-6 ACDF for cervical radiculopathy  POD1: Recovering well. Upper extremity pain remains resolved. No pain complaints at this time. Eating breakfast without issues swallowing. Ambulated and voiding without issue.  POD0: Tolerated procedure well without complication.  Evaluated in postop recovery, still disoriented from anesthesia but able to answer questions and obey commands.  Complains of 10/10 pain diffusely through clavicular area and shoulders bilaterally.  Denies any upper extremity pain that she was having prior to surgery.  Denies any new pain/numbness/tingling in upper or lower extremities.  Physical Exam: Vitals:   10/01/18 0336 10/01/18 0758  BP: 125/74 127/72  Pulse: 93 74  Resp: 18 16  Temp: 98 F (36.7 C) (!) 97.3 F (36.3 C)  SpO2: 100% 100%    A&Ox3. Collar applied.  Strength:5/5 throughout upper and lower extremities Sensation: Intact and symmetric throughout upper and lower extremities Skin: Glue intact at incision site. No tenderness, swelling, warmth, drainage.   Data:  No results for input(s): NA, K, CL, CO2, BUN, CREATININE, LABGLOM, GLUCOSE, CALCIUM in the last 168 hours. No results for input(s): AST, ALT, ALKPHOS in the last 168 hours.  Invalid input(s): TBILI   No results for input(s): WBC, HGB, HCT, PLT in the last 168 hours. No results for input(s): APTT, INR in the last 168 hours.       Other tests/results:  EXAM: CERVICAL SPINE - 2 VIEW  COMPARISON:  09/30/2018  FINDINGS: Interbody fusion is noted at C5-6 with anterior fixation postsurgical changes are noted in the soft tissues of the neck. No other focal abnormality is noted.  IMPRESSION: Status post fusion at C5-6.  Assessment/Plan:  Stacy Cunningham is POD1 status post C5-6 ACDF for cervical radiculopathy.  Symptoms that were present prior to surgery have resolved. Able  to eat, void and ambulate without issue. Discussed wound care and activity restrictions. Will continue pos top pain control with pain medication and muscle relaxer as needed. She is scheduled to follow up in clinic in approximately 2 weeks to monitor progress. Advised to contact office if any questions or concerns arise before then.  Stacy Olp PA-C Department of Neurosurgery

## 2018-10-08 ENCOUNTER — Encounter: Payer: Self-pay | Admitting: Neurosurgery

## 2018-10-14 ENCOUNTER — Other Ambulatory Visit: Payer: Self-pay | Admitting: Family Medicine

## 2018-10-14 DIAGNOSIS — Z1231 Encounter for screening mammogram for malignant neoplasm of breast: Secondary | ICD-10-CM

## 2018-10-16 ENCOUNTER — Encounter: Payer: Self-pay | Admitting: Neurosurgery

## 2018-11-25 ENCOUNTER — Other Ambulatory Visit: Payer: Self-pay

## 2018-11-25 ENCOUNTER — Ambulatory Visit
Admission: RE | Admit: 2018-11-25 | Discharge: 2018-11-25 | Disposition: A | Discharge status: Home / Self Care | Payer: Medicare Other | Source: Ambulatory Visit | Attending: Family Medicine | Admitting: Family Medicine

## 2018-11-25 DIAGNOSIS — Z1231 Encounter for screening mammogram for malignant neoplasm of breast: Secondary | ICD-10-CM

## 2019-01-07 DIAGNOSIS — I10 Essential (primary) hypertension: Secondary | ICD-10-CM | POA: Insufficient documentation

## 2019-01-07 DIAGNOSIS — E1169 Type 2 diabetes mellitus with other specified complication: Secondary | ICD-10-CM | POA: Insufficient documentation

## 2019-01-07 DIAGNOSIS — E782 Mixed hyperlipidemia: Secondary | ICD-10-CM | POA: Insufficient documentation

## 2019-03-23 IMAGING — CR DG CHEST 2V
1 series · 2 of 2 positions shown · non-contrast
Comparison: None.

CLINICAL DATA: Epigastric pain

EXAM:
CHEST - 2 VIEW

[Series 1: dg chest 2 view · 0.14mm/px · 2 of 2 slices shown]
[im 1/2]
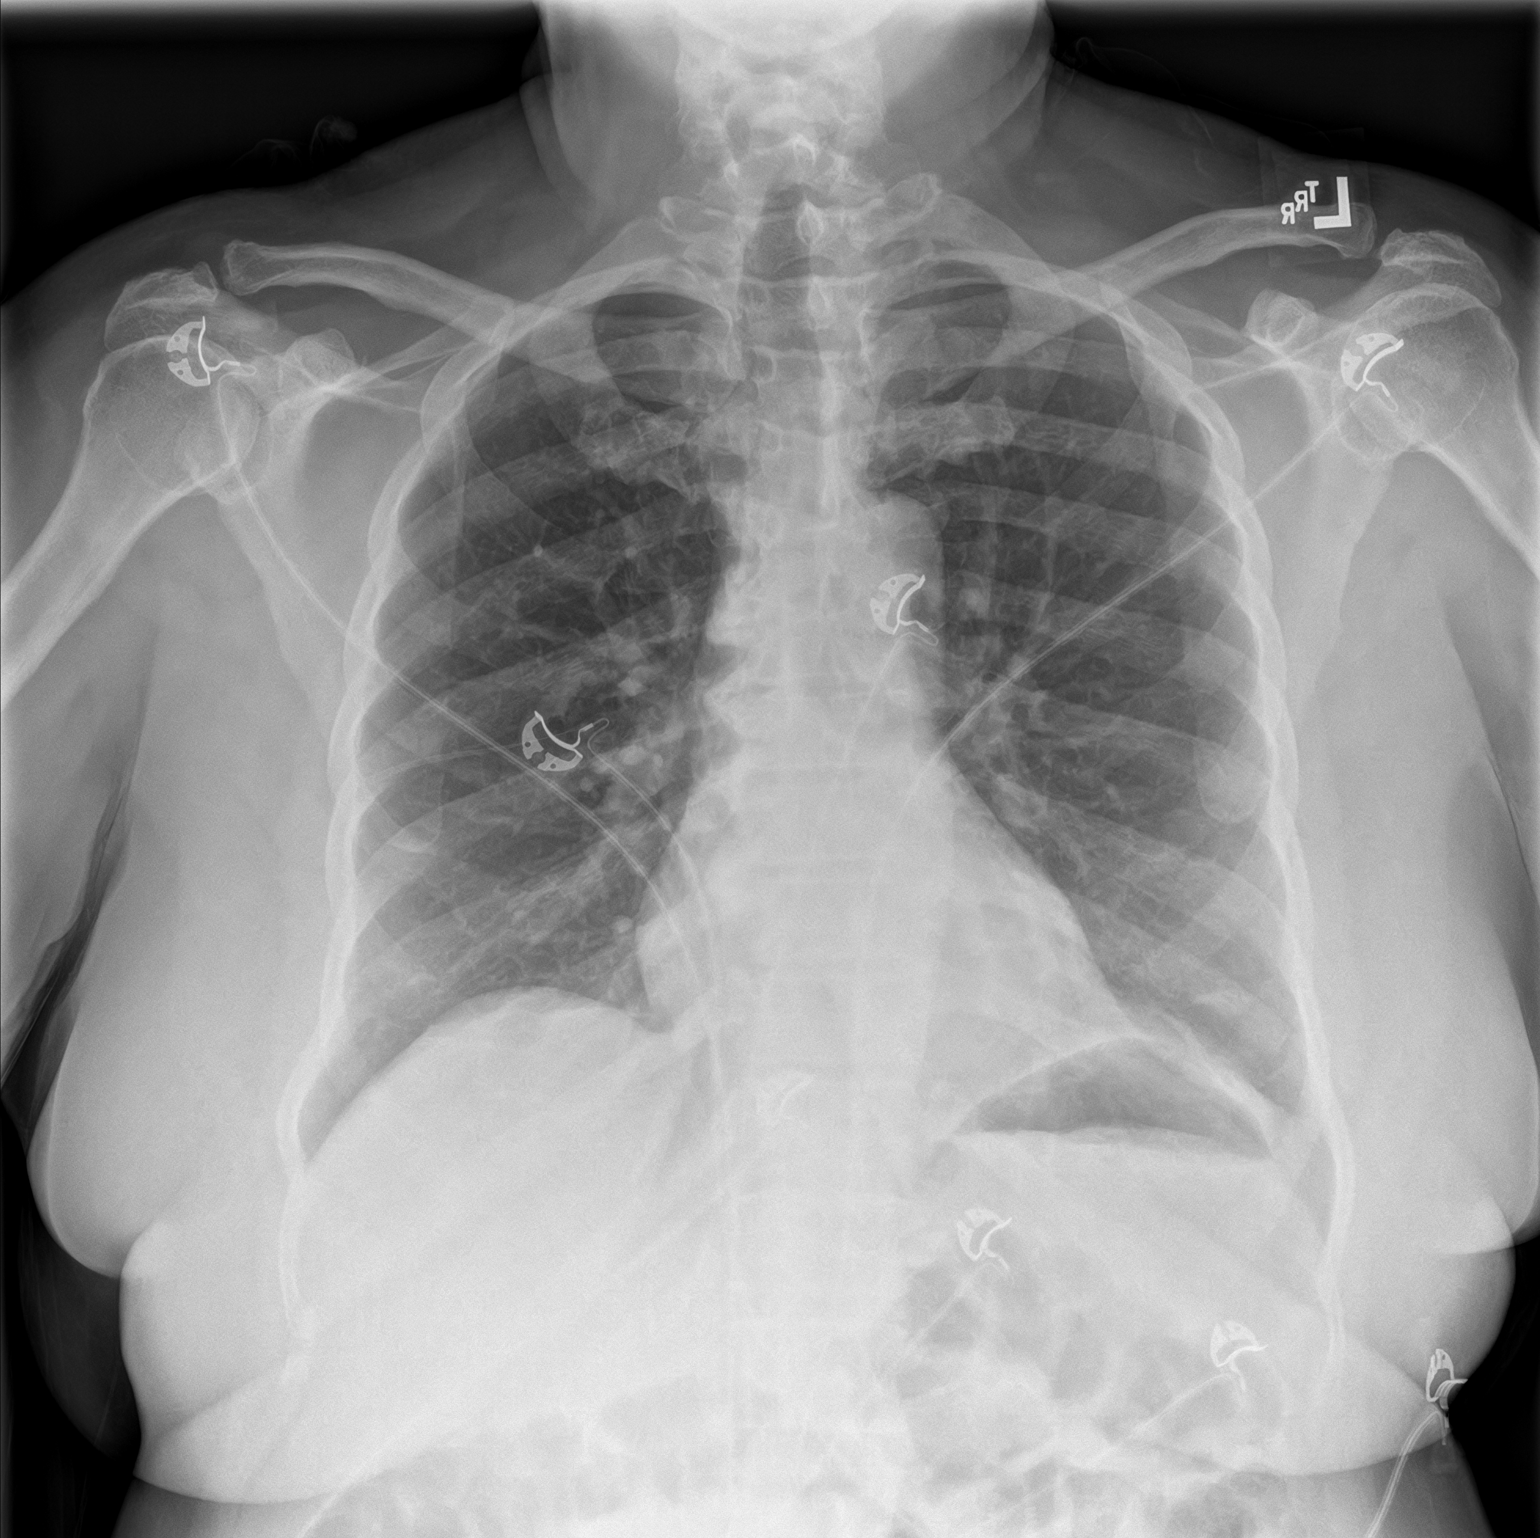
[im 2/2]
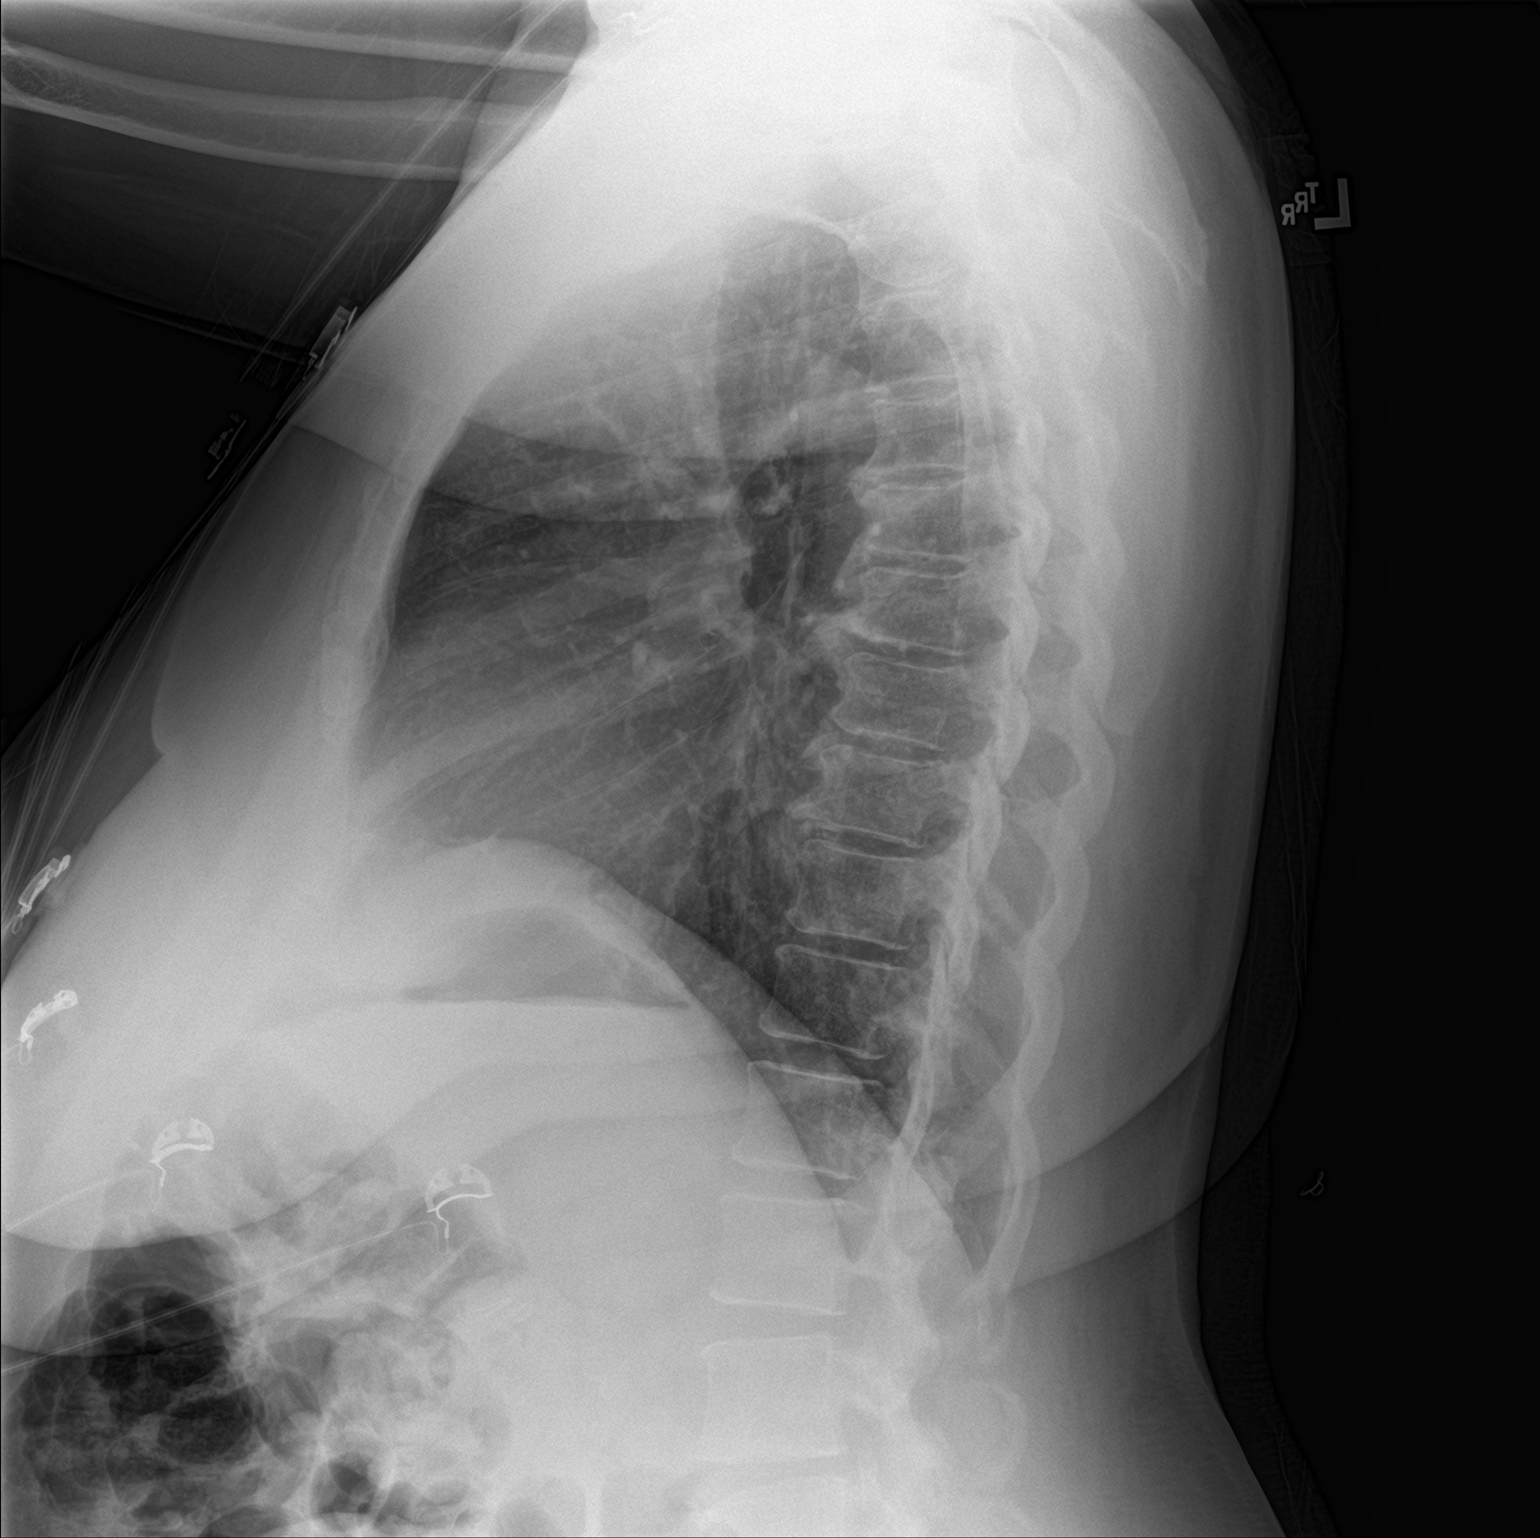

[2 of 2 positions shown; findings below may reference images not displayed]

FINDINGS: Normal heart size and mediastinal contours. No acute infiltrate or
edema. No effusion or pneumothorax. Spondylosis. No acute osseous
findings.
IMPRESSION: No evidence of acute disease.

## 2019-03-25 ENCOUNTER — Other Ambulatory Visit: Payer: Self-pay | Admitting: Family Medicine

## 2019-03-25 DIAGNOSIS — M5431 Sciatica, right side: Secondary | ICD-10-CM

## 2019-04-05 ENCOUNTER — Ambulatory Visit: Admission: RE | Admit: 2019-04-05 | Payer: Medicare Other | Source: Ambulatory Visit

## 2019-04-21 ENCOUNTER — Ambulatory Visit
Admission: RE | Admit: 2019-04-21 | Discharge: 2019-04-21 | Disposition: A | Discharge status: Home / Self Care | Payer: Medicare Other | Source: Ambulatory Visit | Attending: Family Medicine | Admitting: Family Medicine

## 2019-04-21 DIAGNOSIS — M5431 Sciatica, right side: Secondary | ICD-10-CM | POA: Insufficient documentation

## 2019-08-19 ENCOUNTER — Other Ambulatory Visit: Payer: Self-pay | Admitting: Family Medicine

## 2019-08-19 DIAGNOSIS — Z1231 Encounter for screening mammogram for malignant neoplasm of breast: Secondary | ICD-10-CM

## 2019-09-02 DIAGNOSIS — R202 Paresthesia of skin: Secondary | ICD-10-CM | POA: Insufficient documentation

## 2019-09-02 DIAGNOSIS — R200 Anesthesia of skin: Secondary | ICD-10-CM | POA: Insufficient documentation

## 2019-10-13 ENCOUNTER — Encounter: Payer: Self-pay | Admitting: Podiatry

## 2019-10-13 ENCOUNTER — Ambulatory Visit (INDEPENDENT_AMBULATORY_CARE_PROVIDER_SITE_OTHER): Payer: Medicare Other | Admitting: Podiatry

## 2019-10-13 ENCOUNTER — Other Ambulatory Visit: Payer: Self-pay

## 2019-10-13 DIAGNOSIS — R234 Changes in skin texture: Secondary | ICD-10-CM | POA: Insufficient documentation

## 2019-10-13 DIAGNOSIS — B353 Tinea pedis: Secondary | ICD-10-CM | POA: Insufficient documentation

## 2019-10-13 MED ORDER — CLOTRIMAZOLE-BETAMETHASONE 1-0.05 % EX CREA: 1.0000 "application " | TOPICAL_CREAM | Freq: Two times a day (BID) | CUTANEOUS | 0 refills | Status: DC

## 2019-10-13 NOTE — Progress Notes (Signed)
This patient presents to the office saying her skin on the bottom of both feet is drying, peeling and itching.  She says she applies lotion but the skin irritation persists.  She says this has been going on for over 2 years.  She presents for evaluation and treatment.  Vascular  Dorsalis pedis and posterior tibial pulses are palpable  B/L.  Capillary return  WNL.  Temperature gradient is  WNL.  Skin turgor  WNL  Sensorium  Senn Weinstein monofilament wire  WNL right foot.  LOPS diminished left foot.  Nail Exam  Patient has normal nails with no evidence of bacterial or fungal infection.  Orthopedic  Exam  Muscle tone and muscle strength  WNL.  No limitations of motion feet  B/L.  No crepitus or joint effusion noted.  Foot type is unremarkable and digits show no abnormalities.  Bony prominences are unremarkable.  Skin  No open lesions.  Normal skin texture and turgor. Dry peeling in arch of both feet and around the skin on both heels   Tinea pedis  B/L.    IE.  Discussed this condition with this patient.  She does not want to take lamisil by mouth.  Prescribe lotrisone to apply to the affected area.  RTC prn   Gardiner Barefoot DPM

## 2019-11-26 ENCOUNTER — Other Ambulatory Visit: Payer: Self-pay

## 2019-11-26 ENCOUNTER — Ambulatory Visit
Admission: RE | Admit: 2019-11-26 | Discharge: 2019-11-26 | Disposition: A | Discharge status: Home / Self Care | Payer: Medicare Other | Source: Ambulatory Visit | Attending: Family Medicine | Admitting: Family Medicine

## 2019-11-26 DIAGNOSIS — Z1231 Encounter for screening mammogram for malignant neoplasm of breast: Secondary | ICD-10-CM | POA: Diagnosis present

## 2019-12-09 ENCOUNTER — Other Ambulatory Visit: Payer: Self-pay | Admitting: Podiatry

## 2019-12-09 DIAGNOSIS — R234 Changes in skin texture: Secondary | ICD-10-CM

## 2019-12-09 DIAGNOSIS — B353 Tinea pedis: Secondary | ICD-10-CM

## 2019-12-17 ENCOUNTER — Other Ambulatory Visit: Payer: Self-pay | Admitting: Physician Assistant

## 2019-12-17 DIAGNOSIS — Z13820 Encounter for screening for osteoporosis: Secondary | ICD-10-CM

## 2020-03-17 ENCOUNTER — Other Ambulatory Visit: Payer: Self-pay | Admitting: Podiatry

## 2020-03-17 DIAGNOSIS — B353 Tinea pedis: Secondary | ICD-10-CM

## 2020-03-17 DIAGNOSIS — R234 Changes in skin texture: Secondary | ICD-10-CM

## 2020-03-22 ENCOUNTER — Telehealth: Payer: Self-pay | Admitting: *Deleted

## 2020-03-22 ENCOUNTER — Other Ambulatory Visit: Payer: Self-pay | Admitting: *Deleted

## 2020-03-22 DIAGNOSIS — B353 Tinea pedis: Secondary | ICD-10-CM

## 2020-03-22 DIAGNOSIS — R234 Changes in skin texture: Secondary | ICD-10-CM

## 2020-03-22 MED ORDER — CLOTRIMAZOLE-BETAMETHASONE 1-0.05 % EX CREA: TOPICAL_CREAM | CUTANEOUS | 2 refills | Status: DC

## 2020-03-22 NOTE — Telephone Encounter (Signed)
Please advise 

## 2020-06-07 ENCOUNTER — Other Ambulatory Visit: Payer: Self-pay

## 2020-06-07 ENCOUNTER — Ambulatory Visit (INDEPENDENT_AMBULATORY_CARE_PROVIDER_SITE_OTHER): Payer: Medicare Other

## 2020-06-07 ENCOUNTER — Ambulatory Visit (INDEPENDENT_AMBULATORY_CARE_PROVIDER_SITE_OTHER): Payer: Medicare Other | Admitting: Cardiology

## 2020-06-07 ENCOUNTER — Encounter: Payer: Self-pay | Admitting: Cardiology

## 2020-06-07 VITALS — BP 110/62 | HR 91 | Ht 66.0 in | Wt 197.4 lb

## 2020-06-07 DIAGNOSIS — R002 Palpitations: Secondary | ICD-10-CM

## 2020-06-07 DIAGNOSIS — I10 Essential (primary) hypertension: Secondary | ICD-10-CM

## 2020-06-07 DIAGNOSIS — E7800 Pure hypercholesterolemia, unspecified: Secondary | ICD-10-CM

## 2020-06-07 NOTE — Progress Notes (Signed)
Cardiology Office Note:    Date:  06/07/2020   ID:  Stacy Cunningham, DOB [birth year], MRN [id number]  PCP:  [name], MD   [place]  Cardiologist:  Kate Sable, MD  Advanced Practice Provider:  No care team member to display Electrophysiologist:  None       Referring MD: [name]*   Chief Complaint  Patient presents with  . New Patient (Initial Visit)    Self ref for palpitations & fluttering in chest. Medications reviewed by the patient verbally.     History of Present Illness:    Stacy Cunningham is a 71 y.o. female with a hx of hypertension, hyperlipidemia, diabetes who presents due to palpitations.  Patient states having symptoms of palpitations for over a year now.  Symptoms typically occur about 2-3 times a week lasting a few seconds.  She describes symptoms as a fluttery feeling, not associated with exertion.  She does not drink caffeinated drinks often, rarely drinks cappuccino when it is cold.  Symptoms are not associated with dizziness, presyncope or syncope.  She denies any history of cardiac disease.  Past Medical History:  Diagnosis Date  . Anemia   . Diabetes mellitus without complication ([place])    Borderline Diabetes  . Elevated lipids   . GERD (gastroesophageal reflux disease)   . Glaucoma   . Headache    h/o migraines  . Hypertension   . Neuropathy   . Neuropathy   . Neuropathy   . Panic attack     Past Surgical History:  Procedure Laterality Date  . ANTERIOR CERVICAL DECOMP/DISCECTOMY FUSION N/A 09/30/2018   Procedure: ANTERIOR CERVICAL DECOMPRESSION/DISCECTOMY FUSION 1 LEVEL - C 5/6;  Surgeon: Deetta Perla, MD;  Location: [facility] ORS;  Service: Neurosurgery;  Laterality: N/A;  . CHOLECYSTECTOMY N/A 02/06/2018   Procedure: LAPAROSCOPIC CHOLECYSTECTOMY;  Surgeon: Herbert Pun, MD;  Location: [facility] ORS;  Service: General;  Laterality: N/A;  . COLONOSCOPY WITH PROPOFOL N/A 07/13/2017    Procedure: COLONOSCOPY WITH PROPOFOL;  Surgeon: Virgel Manifold, MD;  Location: [facility] ENDOSCOPY;  Service: Endoscopy;  Laterality: N/A;  . ESOPHAGOGASTRODUODENOSCOPY (EGD) WITH PROPOFOL N/A 09/14/2017   Procedure: ESOPHAGOGASTRODUODENOSCOPY (EGD) WITH PROPOFOL;  Surgeon: Virgel Manifold, MD;  Location: [facility] ENDOSCOPY;  Service: Endoscopy;  Laterality: N/A;    Current Medications: Current Meds  Medication Sig  . Accu-Chek Softclix Lancets lancets   . acetaminophen (TYLENOL) 500 MG tablet Take 1,000 mg by mouth 2 (two) times daily as needed for moderate pain or headache.  Marland Kitchen aspirin 81 MG EC tablet Take by mouth.  . brimonidine (ALPHAGAN) 0.15 % ophthalmic solution Place 1 drop into both eyes 3 (three) times daily.  . butalbital-acetaminophen-caffeine (FIORICET) 50-325-40 MG tablet Take 1 tablet by mouth daily as needed.  . cholestyramine (QUESTRAN) 4 g packet Take 4 g by mouth daily.  . clotrimazole (LOTRIMIN) 1 % cream Apply 1 application topically daily as needed (rash).  . clotrimazole-betamethasone (LOTRISONE) cream APPLY 1 APPLICATION TOPICALLY 2 TIMES DAILY  . COMBIGAN 0.2-0.5 % ophthalmic solution Apply 1 drop to eye 2 (two) times daily.  . dorzolamide-timolol (COSOPT) 22.3-6.8 MG/ML ophthalmic solution Place 1 drop into both eyes 2 (two) times daily.  . DULoxetine (CYMBALTA) 30 MG capsule Take by mouth.  . famotidine (PEPCID) 20 MG tablet Take 20 mg by mouth 2 (two) times daily.  . famotidine (PEPCID) 40 MG tablet Take 40 mg by mouth daily.  . ferrous sulfate 325 (65 FE) MG tablet Take  325 mg by mouth 2 (two) times a day.   . gabapentin (NEURONTIN) 800 MG tablet Take 800 mg by mouth 3 (three) times daily.  . hydrochlorothiazide (HYDRODIURIL) 25 MG tablet Take 25 mg by mouth daily.  Marland Kitchen lidocaine (LIDODERM) 5 % Place 2 patches onto the skin daily as needed (pain). Remove & Discard patch within 12 hours or as directed by MD  . lisinopril (PRINIVIL,ZESTRIL) 5 MG tablet Take 5 mg by  mouth every morning.   . metFORMIN (GLUCOPHAGE) 500 MG tablet Take 500-1,000 mg by mouth See admin instructions. Take 1000 mg in the morning and 500 mg in the evening  . pregabalin (LYRICA) 25 MG capsule Take 25 mg by mouth 3 (three) times daily.  . simvastatin (ZOCOR) 20 MG tablet Take 20 mg by mouth every evening.  . timolol (BETIMOL) 0.5 % ophthalmic solution Place 1 drop into both eyes 2 (two) times daily.  . timolol (TIMOPTIC) 0.5 % ophthalmic solution      Allergies:   Sulfa antibiotics, Darvon [propoxyphene], Other, and Tape   Social History   Socioeconomic History  . Marital status: Married    Spouse name: Not on file  . Number of children: Not on file  . Years of education: Not on file  . Highest education level: Not on file  Occupational History  . Not on file  Tobacco Use  . Smoking status: Never Smoker  . Smokeless tobacco: Never Used  Vaping Use  . Vaping Use: Never used  Substance and Sexual Activity  . Alcohol use: Not Currently  . Drug use: No  . Sexual activity: Yes  Other Topics Concern  . Not on file  Social History Narrative  . Not on file   Social Determinants of Health   Financial Resource Strain: Not on file  Food Insecurity: Not on file  Transportation Needs: Not on file  Physical Activity: Not on file  Stress: Not on file  Social Connections: Not on file     Family History: The patient's family history includes Breast cancer (age of onset: 89) in her sister; Heart attack (age of onset: 17) in her mother; Hyperlipidemia in her mother; Hypertension in her mother.  ROS:   Please see the history of present illness.     All other systems reviewed and are negative.  EKGs/Labs/Other Studies Reviewed:    The following studies were reviewed today:   EKG:  EKG is  ordered today.  The ekg ordered today demonstrates normal sinus rhythm, normal ECG  Recent Labs: No results found for requested labs within last 8760 hours.  Recent Lipid Panel No  results found for: CHOL, TRIG, HDL, CHOLHDL, VLDL, LDLCALC, LDLDIRECT   Risk Assessment/Calculations:      Physical Exam:    VS:  BP 110/62 (BP Location: Right Arm, Patient Position: Sitting, Cuff Size: Normal)   Pulse 91   Ht 5\' 6"  (1.676 m)   Wt 197 lb 6 oz (89.5 kg)   SpO2 98%   BMI 31.86 kg/m     Wt Readings from Last 3 Encounters:  06/07/20 197 lb 6 oz (89.5 kg)  09/30/18 187 lb (84.8 kg)  09/18/18 187 lb (84.8 kg)     GEN:  Well nourished, well developed in no acute distress HEENT: Normal NECK: No JVD; No carotid bruits LYMPHATICS: No lymphadenopathy CARDIAC: RRR, no murmurs, rubs, gallops RESPIRATORY:  Clear to auscultation without rales, wheezing or rhonchi  ABDOMEN: Soft, non-tender, non-distended MUSCULOSKELETAL:  No edema; No deformity  SKIN: Warm and dry NEUROLOGIC:  Alert and oriented x 3 PSYCHIATRIC:  Normal affect   ASSESSMENT:    1. Palpitations   2. Primary hypertension   3. Pure hypercholesterolemia    PLAN:    In order of problems listed above:  1. Palpitations, placed to a cardiac monitor to evaluate any significant arrhythmias. 2. Hypertension, BP controlled.  Continue current BP meds. 3. Hyperlipidemia, continue statin.  Follow-up after cardiac monitor.     Medication Adjustments/Labs and Tests Ordered: Current medicines are reviewed at length with the patient today.  Concerns regarding medicines are outlined above.  Orders Placed This Encounter  Procedures  . LONG TERM MONITOR (3-14 DAYS)  . EKG 12-Lead   No orders of the defined types were placed in this encounter.   Patient Instructions  Medication Instructions:  Your physician recommends that you continue on your current medications as directed. Please refer to the Current Medication list given to you today.  *If you need a refill on your cardiac medications before your next appointment, please call your pharmacy*   Lab Work: None ordered If you have labs (blood work)  drawn today and your tests are completely normal, you will receive your results only by: Marland Kitchen MyChart Message (if you have MyChart) OR . A paper copy in the mail If you have any lab test that is abnormal or we need to change your treatment, we will call you to review the results.   Testing/Procedures:  Your physician has recommended that you wear a Zio monitor for 2 weeks. This monitor is a medical device that records the heart's electrical activity. Doctors most often use these monitors to diagnose arrhythmias. Arrhythmias are problems with the speed or rhythm of the heartbeat. The monitor is a small device applied to your chest. You can wear one while you do your normal daily activities. While wearing this monitor if you have any symptoms to push the button and record what you felt. Once you have worn this monitor for the period of time provider prescribed (Usually 14 days), you will return the monitor device in the postage paid box. Once it is returned they will download the data collected and provide [place] with a report which the provider will then review and we will call you with those results. Important tips:  1. Avoid showering during the first 24 hours of wearing the monitor. 2. Avoid excessive sweating to help maximize wear time. 3. Do not submerge the device, no hot tubs, and no swimming pools. 4. Keep any lotions or oils away from the patch. 5. After 24 hours you may shower with the patch on. Take brief showers with your back facing the shower head.  6. Do not remove patch once it has been placed because that will interrupt data and decrease adhesive wear time. 7. Push the button when you have any symptoms and write down what you were feeling. 8. Once you have completed wearing your monitor, remove and place into box which has postage paid and place in your outgoing mailbox.  9. If for some reason you have misplaced your box then call our office and we can provide another box and/or mail it  off for you.         Follow-Up: At [facility], you and your health needs are our priority.  As part of our continuing mission to provide you with exceptional heart care, we have created designated Provider Care Teams.  These Care Teams include your primary Cardiologist (  physician) and Advanced Practice Providers (APPs -  Physician Assistants and Nurse Practitioners) who all work together to provide you with the care you need, when you need it.  We recommend signing up for the patient portal called "MyChart".  Sign up information is provided on this After Visit Summary.  MyChart is used to connect with patients for Virtual Visits (Telemedicine).  Patients are able to view lab/test results, encounter notes, upcoming appointments, etc.  Non-urgent messages can be sent to your provider as well.   To learn more about what you can do with MyChart, go to [email or web address].    Your next appointment:   5-6 week(s)  The format for your next appointment:   In Person  Provider:   Kate Sable, MD   Other Instructions      Signed, Kate Sable, MD  06/07/2020 4:40 PM    [place]

## 2020-06-07 NOTE — Patient Instructions (Signed)
Medication Instructions:  Your physician recommends that you continue on your current medications as directed. Please refer to the Current Medication list given to you today.  *If you need a refill on your cardiac medications before your next appointment, please call your pharmacy*   Lab Work: None ordered If you have labs (blood work) drawn today and your tests are completely normal, you will receive your results only by: Marland Kitchen MyChart Message (if you have MyChart) OR . A paper copy in the mail If you have any lab test that is abnormal or we need to change your treatment, we will call you to review the results.   Testing/Procedures:  Your physician has recommended that you wear a Zio monitor for 2 weeks. This monitor is a medical device that records the heart's electrical activity. Doctors most often use these monitors to diagnose arrhythmias. Arrhythmias are problems with the speed or rhythm of the heartbeat. The monitor is a small device applied to your chest. You can wear one while you do your normal daily activities. While wearing this monitor if you have any symptoms to push the button and record what you felt. Once you have worn this monitor for the period of time provider prescribed (Usually 14 days), you will return the monitor device in the postage paid box. Once it is returned they will download the data collected and provide [place] with a report which the provider will then review and we will call you with those results. Important tips:  1. Avoid showering during the first 24 hours of wearing the monitor. 2. Avoid excessive sweating to help maximize wear time. 3. Do not submerge the device, no hot tubs, and no swimming pools. 4. Keep any lotions or oils away from the patch. 5. After 24 hours you may shower with the patch on. Take brief showers with your back facing the shower head.  6. Do not remove patch once it has been placed because that will interrupt data and decrease adhesive wear  time. 7. Push the button when you have any symptoms and write down what you were feeling. 8. Once you have completed wearing your monitor, remove and place into box which has postage paid and place in your outgoing mailbox.  9. If for some reason you have misplaced your box then call our office and we can provide another box and/or mail it off for you.         Follow-Up: At [facility], you and your health needs are our priority.  As part of our continuing mission to provide you with exceptional heart care, we have created designated Provider Care Teams.  These Care Teams include your primary Cardiologist (physician) and Advanced Practice Providers (APPs -  Physician Assistants and Nurse Practitioners) who all work together to provide you with the care you need, when you need it.  We recommend signing up for the patient portal called "MyChart".  Sign up information is provided on this After Visit Summary.  MyChart is used to connect with patients for Virtual Visits (Telemedicine).  Patients are able to view lab/test results, encounter notes, upcoming appointments, etc.  Non-urgent messages can be sent to your provider as well.   To learn more about what you can do with MyChart, go to [email or web address].    Your next appointment:   5-6 week(s)  The format for your next appointment:   In Person  Provider:   Kate Sable, MD   Other Instructions

## 2020-06-09 DIAGNOSIS — R002 Palpitations: Secondary | ICD-10-CM | POA: Diagnosis not present

## 2020-06-18 ENCOUNTER — Telehealth: Payer: Self-pay

## 2020-06-18 NOTE — Telephone Encounter (Signed)
Called to give patient result note for her ZIo as follows:  Overall okay cardiac monitor which was 1day 15 hours. no significant arrhythmias noted on cardiac monitor. Patient triggered events associated with sinus rhythm. 1 run of SVT was noted but not associated with any trigger. Overall benign cardiac monitor  Was unable to leave message.

## 2020-07-12 ENCOUNTER — Other Ambulatory Visit: Payer: Self-pay

## 2020-07-12 ENCOUNTER — Ambulatory Visit (INDEPENDENT_AMBULATORY_CARE_PROVIDER_SITE_OTHER): Payer: Medicare Other | Admitting: Cardiology

## 2020-07-12 ENCOUNTER — Encounter: Payer: Self-pay | Admitting: Cardiology

## 2020-07-12 VITALS — BP 100/60 | HR 82 | Ht 66.0 in | Wt 199.0 lb

## 2020-07-12 DIAGNOSIS — R002 Palpitations: Secondary | ICD-10-CM | POA: Diagnosis not present

## 2020-07-12 DIAGNOSIS — I10 Essential (primary) hypertension: Secondary | ICD-10-CM

## 2020-07-12 DIAGNOSIS — E7800 Pure hypercholesterolemia, unspecified: Secondary | ICD-10-CM

## 2020-07-12 NOTE — Progress Notes (Signed)
Cardiology Office Note:    Date:  07/12/2020   ID:  Stacy Cunningham, DOB Sep 04, 1950, MRN [id number]  PCP:  [name], MD   [place]  Cardiologist:  Kate Sable, MD  Advanced Practice Provider:  No care team member to display Electrophysiologist:  None       Referring MD: [name]*   Chief Complaint  Patient presents with  . Other    Follow up post Zio - Patient c.o leg pain for months but has an appt with someone to get them looked at. Meds reviewed verbally with patient.     History of Present Illness:    Stacy Cunningham is a 70 y.o. female with a hx of hypertension, hyperlipidemia, diabetes who presents for follow-up.  Was last seen due to palpitations on and off for about a year.  Describes a fluttery feeling not associated with exertion lasting a few seconds.  Cardiac monitor was placed, patient will monitor for only 1 day 5 hours due to skin irritation.  She feels well, states symptoms overall are improved.  Has no concerns at this time.   Past Medical History:  Diagnosis Date  . Anemia   . Diabetes mellitus without complication ([place])    Borderline Diabetes  . Elevated lipids   . GERD (gastroesophageal reflux disease)   . Glaucoma   . Headache    h/o migraines  . Hypertension   . Neuropathy   . Neuropathy   . Neuropathy   . Panic attack     Past Surgical History:  Procedure Laterality Date  . ANTERIOR CERVICAL DECOMP/DISCECTOMY FUSION N/A 09/30/2018   Procedure: ANTERIOR CERVICAL DECOMPRESSION/DISCECTOMY FUSION 1 LEVEL - C 5/6;  Surgeon: Deetta Perla, MD;  Location: [facility] ORS;  Service: Neurosurgery;  Laterality: N/A;  . CHOLECYSTECTOMY N/A 02/06/2018   Procedure: LAPAROSCOPIC CHOLECYSTECTOMY;  Surgeon: Herbert Pun, MD;  Location: [facility] ORS;  Service: General;  Laterality: N/A;  . COLONOSCOPY WITH PROPOFOL N/A 07/13/2017   Procedure: COLONOSCOPY WITH PROPOFOL;  Surgeon: Virgel Manifold, MD;   Location: [facility];  Service: Endoscopy;  Laterality: N/A;  . ESOPHAGOGASTRODUODENOSCOPY (EGD) WITH PROPOFOL N/A 09/14/2017   Procedure: ESOPHAGOGASTRODUODENOSCOPY (EGD) WITH PROPOFOL;  Surgeon: Virgel Manifold, MD;  Location: [facility];  Service: Endoscopy;  Laterality: N/A;    Current Medications: Current Meds  Medication Sig  . Accu-Chek Softclix Lancets lancets   . acetaminophen (TYLENOL) 500 MG tablet Take 1,000 mg by mouth 2 (two) times daily as needed for moderate pain or headache.  Marland Kitchen aspirin 81 MG EC tablet Take by mouth.  . brimonidine (ALPHAGAN) 0.15 % ophthalmic solution Place 1 drop into both eyes 3 (three) times daily.  . butalbital-acetaminophen-caffeine (FIORICET) 50-325-40 MG tablet Take 1 tablet by mouth daily as needed.  . cholestyramine (QUESTRAN) 4 g packet Take 4 g by mouth daily.  . clotrimazole (LOTRIMIN) 1 % cream Apply 1 application topically daily as needed (rash).  . clotrimazole-betamethasone (LOTRISONE) cream APPLY 1 APPLICATION TOPICALLY 2 TIMES DAILY  . COMBIGAN 0.2-0.5 % ophthalmic solution Apply 1 drop to eye 2 (two) times daily.  . DULoxetine (CYMBALTA) 30 MG capsule Take by mouth.  . famotidine (PEPCID) 40 MG tablet Take 40 mg by mouth daily.  . ferrous sulfate 325 (65 FE) MG tablet Take 325 mg by mouth 2 (two) times a day.   . gabapentin (NEURONTIN) 800 MG tablet Take 800 mg by mouth 3 (three) times daily.  . hydrochlorothiazide (HYDRODIURIL) 25 MG tablet  Take 25 mg by mouth daily.  Marland Kitchen lidocaine (LIDODERM) 5 % Place 2 patches onto the skin daily as needed (pain). Remove & Discard patch within 12 hours or as directed by MD  . lisinopril (PRINIVIL,ZESTRIL) 5 MG tablet Take 5 mg by mouth every morning.   . metFORMIN (GLUCOPHAGE) 500 MG tablet Take 500-1,000 mg by mouth See admin instructions. Take 1000 mg in the morning and 500 mg in the evening  . pregabalin (LYRICA) 25 MG capsule Take 25 mg by mouth 3 (three) times daily.  . simvastatin  (ZOCOR) 20 MG tablet Take 20 mg by mouth every evening.  . timolol (BETIMOL) 0.5 % ophthalmic solution Place 1 drop into both eyes 2 (two) times daily.     Allergies:   Sulfa antibiotics, Darvon [propoxyphene], Other, and Tape   Social History   Socioeconomic History  . Marital status: Married    Spouse name: Not on file  . Number of children: Not on file  . Years of education: Not on file  . Highest education level: Not on file  Occupational History  . Not on file  Tobacco Use  . Smoking status: Never Smoker  . Smokeless tobacco: Never Used  Vaping Use  . Vaping Use: Never used  Substance and Sexual Activity  . Alcohol use: Not Currently  . Drug use: No  . Sexual activity: Yes  Other Topics Concern  . Not on file  Social History Narrative  . Not on file   Social Determinants of Health   Financial Resource Strain: Not on file  Food Insecurity: Not on file  Transportation Needs: Not on file  Physical Activity: Not on file  Stress: Not on file  Social Connections: Not on file     Family History: The patient's family history includes Breast cancer (age of onset: 19) in her sister; Heart attack (age of onset: 9) in her mother; Hyperlipidemia in her mother; Hypertension in her mother.  ROS:   Please see the history of present illness.     All other systems reviewed and are negative.  EKGs/Labs/Other Studies Reviewed:    The following studies were reviewed today:   EKG:  EKG not ordered today.   Recent Labs: No results found for requested labs within last 8760 hours.  Recent Lipid Panel No results found for: CHOL, TRIG, HDL, CHOLHDL, VLDL, LDLCALC, LDLDIRECT   Risk Assessment/Calculations:      Physical Exam:    VS:  BP 100/60 (BP Location: Left Arm, Patient Position: Sitting, Cuff Size: Normal)   Pulse 82   Ht 5\' 6"  (1.676 m)   Wt 199 lb (90.3 kg)   SpO2 98%   BMI 32.12 kg/m     Wt Readings from Last 3 Encounters:  07/12/20 199 lb (90.3 kg)   06/07/20 197 lb 6 oz (89.5 kg)  09/30/18 187 lb (84.8 kg)     GEN:  Well nourished, well developed in no acute distress HEENT: Normal NECK: No JVD; No carotid bruits LYMPHATICS: No lymphadenopathy CARDIAC: RRR, no murmurs, rubs, gallops RESPIRATORY:  Clear to auscultation without rales, wheezing or rhonchi  ABDOMEN: Soft, non-tender, non-distended MUSCULOSKELETAL:  No edema; No deformity  SKIN: Warm and dry NEUROLOGIC:  Alert and oriented x 3 PSYCHIATRIC:  Normal affect   ASSESSMENT:    1. Palpitations   2. Primary hypertension   3. Pure hypercholesterolemia    PLAN:    In order of problems listed above:  1. Palpitations, cardiac monitor worn for only  1 day 5 hours due to skin irritation.  No significant arrhythmias noted, one episode of SVT lasting 4 beats not associated with patient triggered events.  Overall benign cardiac monitor. 2. Hypertension, BP controlled/low normal.  Continue lisinopril, HCTZ.  Consider decreasing HCTZ if BP remains on the low end and pr patient requires symptomatic. 3. Hyperlipidemia, continue simvastatin.  Follow-up as needed     Medication Adjustments/Labs and Tests Ordered: Current medicines are reviewed at length with the patient today.  Concerns regarding medicines are outlined above.  No orders of the defined types were placed in this encounter.  No orders of the defined types were placed in this encounter.   Patient Instructions  Medication Instructions:  Your physician recommends that you continue on your current medications as directed. Please refer to the Current Medication list given to you today.  *If you need a refill on your cardiac medications before your next appointment, please call your pharmacy*  Follow-Up: At [facility], you and your health needs are our priority.  As part of our continuing mission to provide you with exceptional heart care, we have created designated Provider Care Teams.  These Care Teams include  your primary Cardiologist (physician) and Advanced Practice Providers (APPs -  Physician Assistants and Nurse Practitioners) who all work together to provide you with the care you need, when you need it.  We recommend signing up for the patient portal called "MyChart".  Sign up information is provided on this After Visit Summary.  MyChart is used to connect with patients for Virtual Visits (Telemedicine).  Patients are able to view lab/test results, encounter notes, upcoming appointments, etc.  Non-urgent messages can be sent to your provider as well.   To learn more about what you can do with MyChart, go to [email or web address].    Your next appointment:   As needed.      Signed, Kate Sable, MD  07/12/2020 4:21 PM    [place]

## 2020-07-12 NOTE — Patient Instructions (Signed)

## 2020-07-30 NOTE — Telephone Encounter (Signed)
Patient was seen in office on 07/12/20. Closing encounter.

## 2020-09-21 ENCOUNTER — Ambulatory Visit: Payer: Medicare Other | Admitting: Podiatry

## 2020-09-28 ENCOUNTER — Ambulatory Visit: Payer: Medicare Other | Admitting: Podiatry

## 2020-10-18 ENCOUNTER — Other Ambulatory Visit: Payer: Self-pay | Admitting: Family Medicine

## 2020-10-18 DIAGNOSIS — Z1231 Encounter for screening mammogram for malignant neoplasm of breast: Secondary | ICD-10-CM

## 2020-10-21 ENCOUNTER — Other Ambulatory Visit: Payer: Self-pay | Admitting: Podiatry

## 2020-10-21 DIAGNOSIS — B353 Tinea pedis: Secondary | ICD-10-CM

## 2020-10-21 DIAGNOSIS — R234 Changes in skin texture: Secondary | ICD-10-CM

## 2020-10-21 NOTE — Telephone Encounter (Signed)
Please advise 

## 2020-11-09 ENCOUNTER — Encounter: Payer: Self-pay | Admitting: Student in an Organized Health Care Education/Training Program

## 2020-11-09 ENCOUNTER — Other Ambulatory Visit: Payer: Self-pay | Admitting: Neurosurgery

## 2020-11-09 ENCOUNTER — Other Ambulatory Visit: Payer: Self-pay

## 2020-11-09 ENCOUNTER — Other Ambulatory Visit (HOSPITAL_COMMUNITY): Payer: Self-pay | Admitting: Neurosurgery

## 2020-11-09 ENCOUNTER — Ambulatory Visit
Payer: Medicare Other | Attending: Student in an Organized Health Care Education/Training Program | Admitting: Student in an Organized Health Care Education/Training Program

## 2020-11-09 VITALS — BP 126/82 | HR 77 | Temp 97.4°F | Resp 16 | Ht 66.0 in | Wt 204.0 lb

## 2020-11-09 DIAGNOSIS — M5416 Radiculopathy, lumbar region: Secondary | ICD-10-CM

## 2020-11-09 DIAGNOSIS — M5412 Radiculopathy, cervical region: Secondary | ICD-10-CM | POA: Diagnosis present

## 2020-11-09 DIAGNOSIS — G8929 Other chronic pain: Secondary | ICD-10-CM | POA: Insufficient documentation

## 2020-11-09 DIAGNOSIS — M47816 Spondylosis without myelopathy or radiculopathy, lumbar region: Secondary | ICD-10-CM | POA: Insufficient documentation

## 2020-11-09 DIAGNOSIS — M48062 Spinal stenosis, lumbar region with neurogenic claudication: Secondary | ICD-10-CM | POA: Diagnosis present

## 2020-11-09 DIAGNOSIS — G894 Chronic pain syndrome: Secondary | ICD-10-CM | POA: Diagnosis present

## 2020-11-09 MED ORDER — PREGABALIN 50 MG PO CAPS: 50.0000 mg | ORAL_CAPSULE | Freq: Three times a day (TID) | ORAL | 0 refills | Status: DC

## 2020-11-09 NOTE — Progress Notes (Signed)
Patient: Stacy Cunningham  Service Category: E/M  Provider: Gillis Santa, MD  DOB: 06-10-1950  DOS: 11/09/2020  Referring Provider: Harvest Dark, FNP  MRN: [id number]  Setting: Ambulatory outpatient  PCP: [name], MD  Type: New Patient  Specialty: Interventional Pain Management    Location: Office  Delivery: Face-to-face     Primary Reason(s) for Visit: Encounter for initial evaluation of one or more chronic problems (new to examiner) potentially causing chronic pain, and posing a threat to normal musculoskeletal function. (Level of risk: High) CC: Back Pain and Arm Pain (Left arm )  HPI  Stacy Cunningham is a 70 y.o. year old, female patient, who comes for the first time to our practice referred by Meeler, Sherren Kerns, FNP for our initial evaluation of her chronic pain. She has Special screening for malignant neoplasms, colon; Diverticulosis of large intestine without diverticulitis; Acute hepatitis C; Closed fracture of phalanx of foot; Mononeuritis; Stomach irritation; Abdominal pain, epigastric; Cervical radiculopathy; Peeling skin; Tinea pedis; Benign essential hypertension; DM type 2 with diabetic mixed hyperlipidemia ([place]); Hyperlipidemia, mixed; and Numbness and tingling of both legs on their problem list. Today she comes in for evaluation of her Back Pain and Arm Pain (Left arm )  Pain Assessment: Location: Lower Back Radiating: radiates down both legs to feet Onset: More than a month ago Duration: Chronic pain Quality: Sharp Severity: 7 /10 (subjective, self-reported pain score)  Effect on ADL: limits activities Timing: Intermittent Modifying factors: gabapentin helps some BP: 126/82  HR: 77  Onset and Duration: Gradual and Present longer than 3 months Cause of pain: Unknown Severity: No change since onset, NAS-11 at its worse: 10/10, NAS-11 at its best: 6/10, NAS-11 now: 8/10, and NAS-11 on the average: 8/10 Timing: Morning, Night, and During activity or  exercise Aggravating Factors: Bending, Kneeling, Prolonged sitting, Prolonged standing, Squatting, Stooping , Twisting, and Walking Alleviating Factors: Cold packs, Hot packs, Lying down, Medications, and Resting Associated Problems: Day-time cramps, Night-time cramps, Numbness, Spasms, Swelling, Tingling, Weakness, Pain that wakes patient up, and Pain that does not allow patient to sleep Quality of Pain: Aching, Agonizing, Annoying, Cramping, Deep, Getting longer, Itching, Nagging, Sharp, Shooting, Stabbing, Throbbing, Tingling, Toothache-like, and Uncomfortable Previous Examinations or Tests: Biopsy, CT scan, MRI scan, X-rays, Nerve conduction test, Neurological evaluation, Neurosurgical evaluation, and Orthopedic evaluation Previous Treatments: Narcotic medications   Stacy Cunningham is a pleasant 70 year old female who presents with a chief complaint of low back pain with radiates down bilateral lower extremity new dermatomal fashion.  Upon further questioning, the majority of her pain is her lower back and upper buttocks but she does have occasional dermatomal radiation to lower extremity.  She also has left arm pain as well.  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  She has had an S1 nerve block as well as a right hip injection with limited benefit.  Her most recent injection was a bilateral L4-L5 transforaminal ESI with limited response.  She has done physical therapy in the past.  She is on Lyrica 25 mg 3 times a day.  She denies any side effects with this medication.  She also takes Tylenol at home which does help somewhat. She is not on any opioid analgesics at the moment.   Historic Controlled Substance Pharmacotherapy Review   Historical Monitoring: The patient  reports no history of drug use. List of all UDS Test(s): No results found for: MDMA, COCAINSCRNUR,  PCPSCRNUR, PCPQUANT, CANNABQUANT, THCU, [place] List of  other Serum/Urine Drug Screening Test(s):  No results found for: AMPHSCRSER, BARBSCRSER, BENZOSCRSER, COCAINSCRSER, COCAINSCRNUR, PCPSCRSER, PCPQUANT, THCSCRSER, THCU, CANNABQUANT, OPIATESCRSER, OXYSCRSER, PROPOXSCRSER, ETH Historical Background Evaluation: [place] PMP: PDMP reviewed during this encounter. Online review of the past 6-month period conducted.             [place] Department of public safety, offender search: Editor, commissioning Information) Non-contributory  Risk Assessment Profile: Aberrant behavior: None observed or detected today Risk factors for fatal opioid overdose: None identified today Fatal overdose hazard ratio (HR): Calculation deferred Non-fatal overdose hazard ratio (HR): Calculation deferred Risk of opioid abuse or dependence: 0.7-3.0% with doses ? 36 MME/day and 6.1-26% with doses ? 120 MME/day. Substance use disorder (SUD) risk level: See below Personal History of Substance Abuse (SUD-Substance use disorder):  Alcohol: Negative  Illegal Drugs: Negative  Rx Drugs: Negative  ORT Risk Level calculation: Low Risk  Opioid Risk Tool - 11/09/20 1350       Family History of Substance Abuse   Alcohol Positive Female    Illegal Drugs Positive Female    Rx Drugs Negative      Personal History of Substance Abuse   Alcohol Negative    Illegal Drugs Negative    Rx Drugs Negative      Age   Age between 21-45 years  No      History of Preadolescent Sexual Abuse   History of Preadolescent Sexual Abuse Negative or Female      Psychological Disease   Psychological Disease Negative    Depression Negative      Total Score   Opioid Risk Tool Scoring 3    Opioid Risk Interpretation Low Risk            ORT Scoring interpretation table:  Score <3 = Low Risk for SUD  Score between 4-7 = Moderate Risk for SUD  Score >8 = High Risk for Opioid Abuse   PHQ-2 Depression Scale:  Total score: 0  PHQ-2 Scoring interpretation table: (Score and probability of major depressive disorder)   Score 0 = No depression  Score 1 = 15.4% Probability  Score 2 = 21.1% Probability  Score 3 = 38.4% Probability  Score 4 = 45.5% Probability  Score 5 = 56.4% Probability  Score 6 = 78.6% Probability   PHQ-9 Depression Scale:  Total score: 0  PHQ-9 Scoring interpretation table:  Score 0-4 = No depression  Score 5-9 = Mild depression  Score 10-14 = Moderate depression  Score 15-19 = Moderately severe depression  Score 20-27 = Severe depression (2.4 times higher risk of SUD and 2.89 times higher risk of overuse)   Pharmacologic Plan: As per protocol, I have not taken over any controlled substance management, pending the results of ordered tests and/or consults.            Initial impression: Pending review of available data and ordered tests.  Meds   Current Outpatient Medications:    Accu-Chek Softclix Lancets lancets, , Disp: , Rfl:    acetaminophen (TYLENOL) 500 MG tablet, Take 1,000 mg by mouth 2 (two) times daily as needed for moderate pain or headache., Disp: , Rfl:    aspirin 81 MG EC tablet, Take by mouth., Disp: , Rfl:    brimonidine (ALPHAGAN) 0.15 % ophthalmic solution, Place 1 drop into both eyes 3 (three) times daily., Disp: , Rfl:    clotrimazole (LOTRIMIN) 1 % cream, Apply 1 application topically daily as needed (rash)., Disp: ,  Rfl:    clotrimazole-betamethasone (LOTRISONE) cream, APPLY 1 APPLICATION TOPICALLY 2 TIMES DAILY, Disp: 45 g, Rfl: 2   COMBIGAN 0.2-0.5 % ophthalmic solution, Apply 1 drop to eye 2 (two) times daily., Disp: , Rfl:    dorzolamide-timolol (COSOPT) 22.3-6.8 MG/ML ophthalmic solution, SMARTSIG:In Eye(s), Disp: , Rfl:    DULoxetine (CYMBALTA) 30 MG capsule, Take by mouth., Disp: , Rfl:    famotidine (PEPCID) 40 MG tablet, Take 40 mg by mouth daily., Disp: , Rfl:    ferrous sulfate 325 (65 FE) MG tablet, Take 325 mg by mouth 2 (two) times a day. , Disp: , Rfl:    fluticasone (FLONASE) 50 MCG/ACT nasal spray, Place into both nostrils., Disp: , Rfl:     gabapentin (NEURONTIN) 800 MG tablet, Take 800 mg by mouth 3 (three) times daily., Disp: , Rfl:    hydrochlorothiazide (HYDRODIURIL) 25 MG tablet, Take 25 mg by mouth daily., Disp: , Rfl:    lidocaine (LIDODERM) 5 %, Place 2 patches onto the skin daily as needed (pain). Remove & Discard patch within 12 hours or as directed by MD, Disp: , Rfl:    lisinopril (ZESTRIL) 2.5 MG tablet, Take 2.5 mg by mouth daily., Disp: , Rfl:    metFORMIN (GLUCOPHAGE) 500 MG tablet, Take 500-1,000 mg by mouth See admin instructions. Take 1000 mg in the morning and 500 mg in the evening, Disp: , Rfl:    pregabalin (LYRICA) 50 MG capsule, Take 1 capsule (50 mg total) by mouth 3 (three) times daily., Disp: 90 capsule, Rfl: 0   simvastatin (ZOCOR) 20 MG tablet, Take 20 mg by mouth every evening., Disp: , Rfl:    timolol (BETIMOL) 0.5 % ophthalmic solution, Place 1 drop into both eyes 2 (two) times daily., Disp: , Rfl:    lisinopril (PRINIVIL,ZESTRIL) 5 MG tablet, Take 5 mg by mouth every morning.  (Patient not taking: Reported on 11/09/2020), Disp: , Rfl:   Imaging Review  Cervical Imaging:   Cervical MR wo contrast: No valid procedures specified. Cervical MR w/wo contrast: Results for orders placed during the hospital encounter of 05/21/18  MR CERVICAL SPINE W WO CONTRAST  Narrative CLINICAL DATA:  Right neck pain radiating into the right arm for 1-1/2 months.  EXAM: MRI CERVICAL SPINE WITHOUT AND WITH CONTRAST  TECHNIQUE: Multiplanar and multiecho pulse sequences of the cervical spine, to include the craniocervical junction and cervicothoracic junction, were obtained without and with intravenous contrast.  CONTRAST:  8 cc Gadavist IV.  COMPARISON:  Plain film cervical spine 04/27/2018.  FINDINGS: Alignment: Maintained.  Vertebrae: Height and signal are normal.  Cord: Normal signal throughout.  No abnormal enhancement.  Posterior Fossa, vertebral arteries, paraspinal tissues: Negative.  Disc  levels:  C2-3: Mild facet arthropathy and a very shallow disc bulge. No stenosis.  C3-4: Shallow disc bulge to the left with some uncovertebral disease on the left. The central canal is open. Moderate to moderately severe left foraminal narrowing is present.  C4-5: Negative.  C5-6: Shallow broad-based right paracentral protrusion effaces the ventral thecal sac but the central canal is widely patent. Mild to moderate foraminal narrowing is worse on the right.  C6-7: Shallow broad-based disc bulge and left greater than right uncovertebral disease. The central canal is open. Mild to moderate foraminal narrowing is worse on the left.  C7-T1: There is some facet degenerative disease, worse on the left. Shallow disc bulge is identified. The central canal is widely patent. Mild to moderate foraminal narrowing is worse on the  left.  IMPRESSION: Shallow broad-based right paracentral protrusion effaces the ventral thecal sac without central canal stenosis. Mild to moderate foraminal narrowing at this level is worse on the right.  Uncovertebral disease on the left at C3-4 causes moderate to moderately severe left foraminal narrowing.  Mild to moderate bilateral foraminal narrowing at C6-7 and C7-T1 is worse on the left at both levels. The central canal is patent at both levels.   Electronically Signed By: Inge Rise M.D. On: 05/21/2018 12:54  DG Cervical Spine 2 or 3 views  Narrative CLINICAL DATA:  Status post cervical fusion  EXAM: CERVICAL SPINE - 2 VIEW  COMPARISON:  09/30/2018  FINDINGS: Interbody fusion is noted at C5-6 with anterior fixation postsurgical changes are noted in the soft tissues of the neck. No other focal abnormality is noted.  IMPRESSION: Status post fusion at C5-6.   Electronically Signed By: Inez Catalina M.D. On: 10/01/2018 07:40  Lumbosacral Imaging: Lumbar MR wo contrast: Results for orders placed during the hospital encounter of  04/21/19  MR LUMBAR SPINE WO CONTRAST  Narrative CLINICAL DATA:  Right-sided sciatica. Additional history provided: Chronic pain off and on, bilateral leg pain (worse on the right).  EXAM: MRI LUMBAR SPINE WITHOUT CONTRAST  TECHNIQUE: Multiplanar, multisequence MR imaging of the lumbar spine was performed. No intravenous contrast was administered.  COMPARISON:  CT abdomen/pelvis 01/23/2018, report from lumbar spine radiographs 05/22/1996 (images unavailable)  FINDINGS: Segmentation: For the purposes of this dictation, five lumbar vertebrae are assumed and the caudal most well-formed intervertebral disc is designated L5-S1.  Alignment:  Normal  Vertebrae: Vertebral body height is maintained. No significant marrow edema or suspicious osseous lesion.  Conus medullaris and cauda equina: Conus extends to the L1 level. No signal abnormality within the visualized distal spinal cord.  Paraspinal and other soft tissues: Incompletely assessed bilateral renal cysts. Paraspinal soft tissues within normal limits.  Disc levels:  Mild multilevel disc degeneration, greatest at L5-S1.  Congenitally narrow lumbar spinal canal.  T12-L1: Facet arthrosis (advanced on the left). No significant disc herniation, degenerative spinal canal stenosis or neural foraminal narrowing.  L1-L2: Small disc bulge. Facet arthrosis/ligamentum flavum hypertrophy. No significant degenerative spinal canal stenosis or neural foraminal narrowing.  L2-L3: Disc bulge. Prominent facet arthrosis with ligamentum flavum hypertrophy. Moderate central canal stenosis with mild bilateral subarticular narrowing (greater on the left). No significant neural foraminal stenosis.  L3-L4: Disc bulge. Prominent facet arthrosis and ligamentum flavum hypertrophy. Moderate/severe spinal canal stenosis. No significant neural foraminal narrowing.  L4-L5: Disc bulge. Superimposed broad-based central disc protrusion eccentric  to the left at site of posterior annular fissure. Prominent facet arthrosis and ligamentum flavum hypertrophy. Bilateral subarticular stenosis (greater on the left) with significant crowding of the descending left L5 nerve root. Mild central canal stenosis. No significant foraminal narrowing.  L5-S1: Disc bulge with endplate spurring. Superimposed broad-based central disc protrusion. Mild facet arthrosis/ligamentum flavum hypertrophy. Bilateral subarticular stenosis with encroachment upon the bilateral descending S1 nerve roots (series 8, image 32). Mild central canal and bilateral neural foraminal narrowing also present at this level.  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  At L3-L4, multifactorial moderate/severe spinal canal stenosis.  At L2-L3, multifactorial moderate central canal stenosis with mild bilateral subarticular narrowing.  At L4-L5, a broad-based central disc protrusion contributes to bilateral subarticular stenosis (greater on the left) with significant crowding of the descending left L5 nerve root. Mild central canal stenosis also  present at this level.  At L5-S1, a broad-based central disc protrusion contributes to bilateral subarticular stenosis with encroachment upon the bilateral descending S1 nerve roots. Mild central canal and bilateral neural foraminal narrowing also present at this level.   Electronically Signed By: Kellie Simmering DO On: 04/21/2019 16:30  Complexity Note: Imaging results reviewed. Results shared with Stacy Cunningham, using Layman's terms.                         ROS  Cardiovascular: Daily Aspirin intake and High blood pressure Pulmonary or Respiratory: Snoring  Neurological: No reported neurological signs or symptoms such as seizures, abnormal skin sensations, urinary and/or fecal incontinence, being born with an abnormal open spine and/or a  tethered spinal cord Psychological-Psychiatric: No reported psychological or psychiatric signs or symptoms such as difficulty sleeping, anxiety, depression, delusions or hallucinations (schizophrenial), mood swings (bipolar disorders) or suicidal ideations or attempts Gastrointestinal: Reflux or heatburn and Inflamed liver (Hepatitis) Genitourinary: No reported renal or genitourinary signs or symptoms such as difficulty voiding or producing urine, peeing blood, non-functioning kidney, kidney stones, difficulty emptying the bladder, difficulty controlling the flow of urine, or chronic kidney disease Hematological: No reported hematological signs or symptoms such as prolonged bleeding, low or poor functioning platelets, bruising or bleeding easily, hereditary bleeding problems, low energy levels due to low hemoglobin or being anemic Endocrine: No reported endocrine signs or symptoms such as high or low blood sugar, rapid heart rate due to high thyroid levels, obesity or weight gain due to slow thyroid or thyroid disease Rheumatologic: Joint aches and or swelling due to excess weight (Osteoarthritis) Musculoskeletal: Negative for myasthenia gravis, muscular dystrophy, multiple sclerosis or malignant hyperthermia Work History: Retired  Allergies  Stacy Cunningham is allergic to sulfa antibiotics, darvon [propoxyphene], other, and tape.  Laboratory Chemistry Profile   Renal Lab Results  Component Value Date   BUN 14 09/18/2018   CREATININE 0.68 09/18/2018   GFRAA >60 09/18/2018   GFRNONAA >60 09/18/2018   PROTEINUR NEGATIVE 09/18/2018     Electrolytes Lab Results  Component Value Date   NA 139 09/18/2018   K 3.8 09/18/2018   CL 102 09/18/2018   CALCIUM 8.8 (L) 09/18/2018     Hepatic Lab Results  Component Value Date   AST 70 (H) 06/05/2018   ALT 38 06/05/2018   ALBUMIN 3.9 06/05/2018   ALKPHOS 57 06/05/2018   LIPASE 47 06/05/2018     ID Lab Results  Component Value Date    SARSCOV2NAA NOT DETECTED 09/26/2018   STAPHAUREUS NEGATIVE 09/18/2018   MRSAPCR NEGATIVE 09/18/2018     Bone No results found for: VD25OH, VD125OH2TOT, IR4854OE7, OJ5009FG1, 25OHVITD1, 25OHVITD2, 25OHVITD3, TESTOFREE, TESTOSTERONE   Endocrine Lab Results  Component Value Date   GLUCOSE 102 (H) 09/18/2018   GLUCOSEU NEGATIVE 09/18/2018     Neuropathy No results found for: VITAMINB12, FOLATE, HGBA1C, HIV   CNS No results found for: COLORCSF, APPEARCSF, RBCCOUNTCSF, WBCCSF, POLYSCSF, LYMPHSCSF, EOSCSF, PROTEINCSF, GLUCCSF, JCVIRUS, CSFOLI, IGGCSF, LABACHR, ACETBL, LABACHR, ACETBL   Inflammation (CRP: Acute  ESR: Chronic) No results found for: CRP, ESRSEDRATE, LATICACIDVEN   Rheumatology No results found for: RF, ANA, LABURIC, URICUR, LYMEIGGIGMAB, LYMEABIGMQN, HLAB27   Coagulation Lab Results  Component Value Date   INR 0.9 09/18/2018   LABPROT 12.5 09/18/2018   APTT 34 09/18/2018   PLT 238 09/18/2018     Cardiovascular Lab Results  Component Value Date   TROPONINI <0.03 06/05/2018   HGB 10.5 (  L) 09/18/2018   HCT 34.0 (L) 09/18/2018     Screening Lab Results  Component Value Date   SARSCOV2NAA NOT DETECTED 09/26/2018   COVIDSOURCE NASOPHARYNGEAL 09/26/2018   STAPHAUREUS NEGATIVE 09/18/2018   MRSAPCR NEGATIVE 09/18/2018     Cancer No results found for: CEA, CA125, LABCA2   Allergens No results found for: ALMOND, APPLE, ASPARAGUS, AVOCADO, BANANA, BARLEY, BASIL, BAYLEAF, GREENBEAN, LIMABEAN, WHITEBEAN, BEEFIGE, REDBEET, BLUEBERRY, BROCCOLI, CABBAGE, MELON, CARROT, CASEIN, CASHEWNUT, CAULIFLOWER, CELERY     Note: Lab results reviewed.  [place]  Drug: Stacy Cunningham  reports no history of drug use. Alcohol:  reports previous alcohol use. Tobacco:  reports that she has never smoked. She has never used smokeless tobacco. Medical:  has a past medical history of Anemia, Diabetes mellitus without complication ([place]), Elevated lipids, GERD (gastroesophageal reflux disease),  Glaucoma, Headache, Hypertension, Neuropathy, Neuropathy, Neuropathy, and Panic attack. Family: family history includes Breast cancer (age of onset: 22) in her sister; Heart attack (age of onset: 75) in her mother; Hyperlipidemia in her mother; Hypertension in her mother.  Past Surgical History:  Procedure Laterality Date   ANTERIOR CERVICAL DECOMP/DISCECTOMY FUSION N/A 09/30/2018   Procedure: ANTERIOR CERVICAL DECOMPRESSION/DISCECTOMY FUSION 1 LEVEL - C 5/6;  Surgeon: Deetta Perla, MD;  Location: [facility];  Service: Neurosurgery;  Laterality: N/A;   CHOLECYSTECTOMY N/A 02/06/2018   Procedure: LAPAROSCOPIC CHOLECYSTECTOMY;  Surgeon: Herbert Pun, MD;  Location: [facility];  Service: General;  Laterality: N/A;   COLONOSCOPY WITH PROPOFOL N/A 07/13/2017   Procedure: COLONOSCOPY WITH PROPOFOL;  Surgeon: Virgel Manifold, MD;  Location: [facility];  Service: Endoscopy;  Laterality: N/A;   ESOPHAGOGASTRODUODENOSCOPY (EGD) WITH PROPOFOL N/A 09/14/2017   Procedure: ESOPHAGOGASTRODUODENOSCOPY (EGD) WITH PROPOFOL;  Surgeon: Virgel Manifold, MD;  Location: [facility];  Service: Endoscopy;  Laterality: N/A;   Active Ambulatory Problems    Diagnosis Date Noted   Special screening for malignant neoplasms, colon    Diverticulosis of large intestine without diverticulitis    Acute hepatitis C 06/05/2002   Closed fracture of phalanx of foot 01/04/2017   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   Stomach irritation    Abdominal pain, epigastric    Cervical radiculopathy 09/30/2018   Peeling skin 10/13/2019   Tinea pedis 10/13/2019   Benign essential hypertension 01/07/2019   DM type 2 with diabetic mixed hyperlipidemia ([place]) 01/07/2019   Hyperlipidemia, mixed 01/07/2019   Numbness and tingling of both legs 09/02/2019   Resolved Ambulatory Problems    Diagnosis Date Noted   No Resolved Ambulatory Problems   Past Medical History:  Diagnosis Date   Anemia    Diabetes mellitus without  complication (HCC)    Elevated lipids    GERD (gastroesophageal reflux disease)    Glaucoma    Headache    Hypertension    Neuropathy    Neuropathy    Neuropathy    Panic attack    Constitutional Exam  General appearance: Well nourished, well developed, and well hydrated. In no apparent acute distress Vitals:   11/09/20 1340  BP: 126/82  Pulse: 77  Resp: 16  Temp: (!) 97.4 F (36.3 C)  TempSrc: Temporal  SpO2: 100%  Weight: 204 lb (92.5 kg)  Height: $Remove'5\' 6"'meWURiJ$  (1.676 m)   BMI Assessment: Estimated body mass index is 32.93 kg/m as calculated from the following:   Height as of this encounter: $RemoveBeforeD'5\' 6"'xHzWukUoewALTL$  (1.676 m).   Weight as of this encounter: 204 lb (92.5 kg).  BMI interpretation table: BMI level Category Range association with  higher incidence of chronic pain  <18 kg/m2 Underweight   18.5-24.9 kg/m2 Ideal body weight   25-29.9 kg/m2 Overweight Increased incidence by 20%  30-34.9 kg/m2 Obese (Class I) Increased incidence by 68%  35-39.9 kg/m2 Severe obesity (Class II) Increased incidence by 136%  >40 kg/m2 Extreme obesity (Class III) Increased incidence by 254%   Patient's current BMI Ideal Body weight  Body mass index is 32.93 kg/m. Ideal body weight: 59.3 kg (130 lb 11.7 oz) Adjusted ideal body weight: 72.6 kg (160 lb 0.6 oz)   BMI Readings from Last 4 Encounters:  11/09/20 32.93 kg/m  07/12/20 32.12 kg/m  06/07/20 31.86 kg/m  09/30/18 30.18 kg/m   Wt Readings from Last 4 Encounters:  11/09/20 204 lb (92.5 kg)  07/12/20 199 lb (90.3 kg)  06/07/20 197 lb 6 oz (89.5 kg)  09/30/18 187 lb (84.8 kg)    Psych/Mental status: Alert, oriented x 3 (person, place, & time)       Eyes: PERLA Respiratory: No evidence of acute respiratory distress  Cervical Spine Area Exam  Skin & Axial Inspection: No masses, redness, edema, swelling, or associated skin lesions Alignment: Symmetrical Functional ROM: Pain restricted ROM      Stability: No instability detected Muscle  Tone/Strength: Functionally intact. No obvious neuro-muscular anomalies detected. Sensory (Neurological): Dermatomal pain pattern Palpation: No palpable anomalies             Upper Extremity (UE) Exam    Side: Right upper extremity  Side: Left upper extremity  Skin & Extremity Inspection: Skin color, temperature, and hair growth are WNL. No peripheral edema or cyanosis. No masses, redness, swelling, asymmetry, or associated skin lesions. No contractures.  Skin & Extremity Inspection: Skin color, temperature, and hair growth are WNL. No peripheral edema or cyanosis. No masses, redness, swelling, asymmetry, or associated skin lesions. No contractures.  Functional ROM: Pain restricted ROM          Functional ROM: Unrestricted ROM          Muscle Tone/Strength: Functionally intact. No obvious neuro-muscular anomalies detected.  Muscle Tone/Strength: Functionally intact. No obvious neuro-muscular anomalies detected.  Sensory (Neurological): Dermatomal pain pattern          Sensory (Neurological): Unimpaired          Palpation: No palpable anomalies              Palpation: No palpable anomalies              Provocative Test(s):  Phalen's test: deferred Tinel's test: deferred Apley's scratch test (touch opposite shoulder):  Action 1 (Across chest): Decreased ROM Action 2 (Overhead): Decreased ROM Action 3 (LB reach): Decreased ROM   Provocative Test(s):  Phalen's test: deferred Tinel's test: deferred Apley's scratch test (touch opposite shoulder):  Action 1 (Across chest): deferred Action 2 (Overhead): deferred Action 3 (LB reach): deferred    Lumbar Spine Area Exam  Skin & Axial Inspection: No masses, redness, or swelling Alignment: Symmetrical Functional ROM: Pain restricted ROM       Stability: No instability detected Muscle Tone/Strength: Functionally intact. No obvious neuro-muscular anomalies detected. Sensory (Neurological): Dermatomal pain pattern + MSK  Provocative  Tests: Hyperextension/rotation test: (+) bilaterally for facet joint pain. Lumbar quadrant test (Kemp's test): (+) bilaterally for facet joint pain. Lateral bending test: deferred today       Patrick's Maneuver: deferred today                   FABER* test: deferred today  S-I anterior distraction/compression test: deferred today         S-I lateral compression test: deferred today         S-I Thigh-thrust test: deferred today         S-I Gaenslen's test: deferred today         *(Flexion, ABduction and External Rotation) Gait & Posture Assessment  Ambulation: Limited Gait: Antalgic Posture: Difficulty standing up straight, due to pain  Lower Extremity Exam    Side: Right lower extremity  Side: Left lower extremity  Stability: No instability observed          Stability: No instability observed          Skin & Extremity Inspection: Skin color, temperature, and hair growth are WNL. No peripheral edema or cyanosis. No masses, redness, swelling, asymmetry, or associated skin lesions. No contractures.  Skin & Extremity Inspection: Skin color, temperature, and hair growth are WNL. No peripheral edema or cyanosis. No masses, redness, swelling, asymmetry, or associated skin lesions. No contractures.  Functional ROM: Pain restricted ROM hip and knee joint                  Functional ROM: Pain restricted ROM for hip and knee joint                  Muscle Tone/Strength: Functionally intact. No obvious neuro-muscular anomalies detected.  Muscle Tone/Strength: Functionally intact. No obvious neuro-muscular anomalies detected.  Sensory (Neurological): Dermatomal pain pattern        Sensory (Neurological): Dermatomal pain pattern        DTR: Patellar: deferred today Achilles: deferred today Plantar: deferred today  DTR: Patellar: deferred today Achilles: deferred today Plantar: deferred today  Palpation: No palpable anomalies  Palpation: No palpable anomalies     Assessment   Primary Diagnosis & Pertinent Problem List: The primary encounter diagnosis was Chronic radicular lumbar pain. Diagnoses of Lumbar radiculopathy, Lumbar facet arthropathy, Spinal stenosis, lumbar region, with neurogenic claudication, Cervical radicular pain (left C3/4), and Chronic pain syndrome were also pertinent to this visit.  Visit Diagnosis (New problems to examiner): 1. Chronic radicular lumbar pain   2. Lumbar radiculopathy   3. Lumbar facet arthropathy   4. Spinal stenosis, lumbar region, with neurogenic claudication   5. Cervical radicular pain (left C3/4)   6. Chronic pain syndrome    Plan of Care (Initial workup plan)  General Recommendations: The pain condition that the patient suffers from is best treated with a multidisciplinary approach that involves an increase in physical activity to prevent de-conditioning and worsening of the pain cycle, as well as psychological counseling (formal and/or informal) to address the co-morbid psychological affects of pain. Treatment will often involve judicious use of pain medications and interventional procedures to decrease the pain, allowing the patient to participate in the physical activity that will ultimately produce long-lasting pain reductions. The goal of the multidisciplinary approach is to return the patient to a higher level of overall function and to restore their ability to perform activities of daily living.   Increase Lyrica to 50 mg TID Continue APAP, 500 mg QID prn Plan for L-ESI (interlaminar approach given multilevel lumbar neuroforaminal stenosis) Future consideration: lumbar facet medial branch nerve block possible RFA Future consideration for cervical radicular pain: C-ESI  Orders Placed This Encounter  Procedures   Lumbar Epidural Injection    Standing Status:   Future    Standing Expiration Date:   12/10/2020    Scheduling Instructions:  Procedure: Interlaminar Lumbar Epidural Steroid injection (LESI)  L4/5           Laterality: Midline     Sedation: PO Valium     Timeframe: ASAA    Order Specific Question:   Where will this procedure be performed?    Answer:   [facility]   Compliance Drug Analysis, Ur    Volume: 30 ml(s). Minimum 3 ml of urine is needed. Document temperature of fresh sample. Indications: Long term (current) use of opiate analgesic (Z79.891) Test#: [phone] (Comprehensive Profile)    Order Specific Question:   Release to patient    Answer:   Immediate   Lab Orders  Compliance Drug Analysis, Ur Procedure Orders  Lumbar Epidural Injection   Pharmacotherapy (current): Medications ordered:  Meds ordered this encounter  Medications   pregabalin (LYRICA) 50 MG capsule    Sig: Take 1 capsule (50 mg total) by mouth 3 (three) times daily.    Dispense:  90 capsule    Refill:  0    Fill one day early if pharmacy is closed on scheduled refill date. May substitute for generic if available.   Medications administered during this visit: Sharae Cunningham had no medications administered during this visit.   Pharmacological management options:  Opioid Analgesics: The patient was informed that there is no guarantee that she would be a candidate for opioid analgesics. The decision will be made following CDC guidelines. This decision will be based on the results of diagnostic studies, as well as Stacy Cunningham's risk profile.   Membrane stabilizer: Adequate regimen, increase Lyrica to 50 mg TID  Muscle relaxant: To be determined at a later time  NSAID: To be determined at a later time  Other analgesic(s): To be determined at a later time   Interventional management options: Stacy Cunningham was informed that there is no guarantee that she would be a candidate for interventional therapies. The decision will be based on the results of diagnostic studies, as well as Stacy Cunningham's risk profile.  Procedure(s) under consideration:  L3/L4 ESI Lumbar facet medial branch nerve blocks C-ESI     Provider-requested follow-up: Return in about 2 weeks (around 11/23/2020) for L4/5 ESI with PO Valium.  I spent a total of 60 minutes reviewing chart data, face-to-face evaluation with the patient, counseling and coordination of care as detailed above.   Future Appointments  Date Time Provider [place]  11/24/2020  1:20 PM Gillis Santa, MD [facility] None  11/29/2020  2:20 PM [facility]  11/29/2020  3:00 PM [facility]    Note by: Gillis Santa, MD Date: 11/09/2020; Time: 3:03 PM

## 2020-11-09 NOTE — Patient Instructions (Signed)

## 2020-11-12 LAB — COMPLIANCE DRUG ANALYSIS, UR

## 2020-11-17 ENCOUNTER — Other Ambulatory Visit: Payer: Self-pay

## 2020-11-17 ENCOUNTER — Ambulatory Visit
Admission: RE | Admit: 2020-11-17 | Discharge: 2020-11-17 | Disposition: A | Discharge status: Home / Self Care | Payer: Medicare Other | Source: Ambulatory Visit | Attending: Neurosurgery | Admitting: Neurosurgery

## 2020-11-17 DIAGNOSIS — M5416 Radiculopathy, lumbar region: Secondary | ICD-10-CM | POA: Diagnosis present

## 2020-11-24 ENCOUNTER — Encounter: Payer: Self-pay | Admitting: Student in an Organized Health Care Education/Training Program

## 2020-11-24 ENCOUNTER — Other Ambulatory Visit: Payer: Self-pay

## 2020-11-24 ENCOUNTER — Ambulatory Visit
Admission: RE | Admit: 2020-11-24 | Discharge: 2020-11-24 | Disposition: A | Discharge status: Home / Self Care | Payer: Medicare Other | Source: Ambulatory Visit | Attending: Student in an Organized Health Care Education/Training Program | Admitting: Student in an Organized Health Care Education/Training Program

## 2020-11-24 ENCOUNTER — Ambulatory Visit (HOSPITAL_BASED_OUTPATIENT_CLINIC_OR_DEPARTMENT_OTHER): Payer: Medicare Other | Admitting: Student in an Organized Health Care Education/Training Program

## 2020-11-24 VITALS — BP 121/76 | HR 90 | Temp 96.6°F | Resp 16 | Ht 72.0 in | Wt 204.0 lb

## 2020-11-24 DIAGNOSIS — M5416 Radiculopathy, lumbar region: Secondary | ICD-10-CM | POA: Insufficient documentation

## 2020-11-24 DIAGNOSIS — G894 Chronic pain syndrome: Secondary | ICD-10-CM | POA: Insufficient documentation

## 2020-11-24 DIAGNOSIS — G8929 Other chronic pain: Secondary | ICD-10-CM

## 2020-11-24 MED ORDER — SODIUM CHLORIDE 0.9% FLUSH
2.0000 mL | Freq: Once | INTRAVENOUS | Status: AC
  Administered 2020-11-24: 2 mL

## 2020-11-24 MED ORDER — LIDOCAINE HCL 2 % IJ SOLN
20.0000 mL | Freq: Once | INTRAMUSCULAR | Status: AC
  Administered 2020-11-24: 200 mg

## 2020-11-24 MED ORDER — LIDOCAINE HCL (PF) 2 % IJ SOLN
INTRAMUSCULAR | Status: AC
  Filled 2020-11-24: qty 5

## 2020-11-24 MED ORDER — DEXAMETHASONE SODIUM PHOSPHATE 10 MG/ML IJ SOLN
10.0000 mg | Freq: Once | INTRAMUSCULAR | Status: AC
  Administered 2020-11-24: 10 mg

## 2020-11-24 MED ORDER — SODIUM CHLORIDE (PF) 0.9 % IJ SOLN
INTRAMUSCULAR | Status: AC
  Filled 2020-11-24: qty 10

## 2020-11-24 MED ORDER — DIAZEPAM 5 MG PO TABS
ORAL_TABLET | ORAL | Status: AC
  Filled 2020-11-24: qty 1

## 2020-11-24 MED ORDER — DEXAMETHASONE SODIUM PHOSPHATE 10 MG/ML IJ SOLN
INTRAMUSCULAR | Status: AC
  Filled 2020-11-24: qty 1

## 2020-11-24 MED ORDER — IOHEXOL 180 MG/ML  SOLN
10.0000 mL | Freq: Once | INTRAMUSCULAR | Status: AC
  Administered 2020-11-24: 5 mL via EPIDURAL

## 2020-11-24 MED ORDER — DIAZEPAM 5 MG PO TABS
5.0000 mg | ORAL_TABLET | Freq: Once | ORAL | Status: AC
  Administered 2020-11-24: 5 mg via ORAL

## 2020-11-24 MED ORDER — ROPIVACAINE HCL 2 MG/ML IJ SOLN
2.0000 mL | Freq: Once | INTRAMUSCULAR | Status: AC
  Administered 2020-11-24: 2 mL via EPIDURAL

## 2020-11-24 MED ORDER — ROPIVACAINE HCL 2 MG/ML IJ SOLN
INTRAMUSCULAR | Status: AC
  Filled 2020-11-24: qty 20

## 2020-11-24 NOTE — Progress Notes (Signed)
PROVIDER NOTE: Information contained herein reflects review and annotations entered in association with encounter. Interpretation of such information and data should be left to medically-trained personnel. Information provided to patient can be located elsewhere in the medical record under "Patient Instructions". Document created using STT-dictation technology, any transcriptional errors that may result from process are unintentional.    Patient: Stacy Cunningham  Service Category: Procedure  Provider: Gillis Santa, MD  DOB: [birth year]  DOS: 11/24/2020  Location: [facility]  MRN: [id number]  Setting: Ambulatory - outpatient  Referring Provider: [name]*  Type: Established Patient  Specialty: Interventional Pain Management  PCP: [name], MD   Primary Reason for Visit: Interventional Pain Management Treatment. CC: Back Pain (lower)  Procedure:          Anesthesia, Analgesia, Anxiolysis:  Type: Therapeutic Inter-Laminar Epidural Steroid Injection           Region: Lumbar Level: L4-5 Level. Laterality: Right-Sided         Type: Minimal (Conscious) Anxiolysis combined with Local Anesthesia Indication(s): Analgesia and Anxiety Route: Oral (PO) IV Access: Secured Sedation: Meaningful verbal contact was maintained at all times during the procedure  Local Anesthetic: Lidocaine 1-2%  Position: Prone with head of the table was raised to facilitate breathing.   Indications: 1. Chronic radicular lumbar pain   2. Lumbar radiculopathy   3. 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   At L3-L4, multifactorial moderate/severe spinal canal stenosis.   At L2-L3, multifactorial moderate central canal stenosis with mild bilateral subarticular narrowing.   At  L4-L5, a broad-based central disc protrusion contributes to bilateral subarticular stenosis (greater on the left) with significant crowding of the descending left L5 nerve root. Mild central canal stenosis also present at this level.   At L5-S1, a broad-based central disc protrusion contributes to bilateral subarticular stenosis with encroachment upon the bilateral descending S1 nerve roots. Mild central canal and bilateral neural foraminal narrowing also present at this level.    Pre-op H&P Assessment:  Stacy Cunningham is a 70 y.o. (year old), female patient, seen today for interventional treatment. She  has a past surgical history that includes Colonoscopy with propofol (N/A, 07/13/2017); Esophagogastroduodenoscopy (egd) with propofol (N/A, 09/14/2017); Cholecystectomy (N/A, 02/06/2018); and Anterior cervical decomp/discectomy fusion (N/A, 09/30/2018). Stacy Cunningham has a current medication list which includes the following prescription(s): accu-chek softclix lancets, acetaminophen, aspirin, brimonidine, clotrimazole, clotrimazole-betamethasone, combigan, dorzolamide-timolol, duloxetine, famotidine, ferrous sulfate, gabapentin, hydrochlorothiazide, lidocaine, lisinopril, metformin, pregabalin, simvastatin, timolol, fluticasone, and lisinopril. Her primarily concern today is the Back Pain (lower)  Initial Vital Signs:  Pulse/HCG Rate: 83  Temp: (!) 96.6 F (35.9 C) Resp: 16 BP: 110/72 SpO2: 100 %  BMI: Estimated body mass index is 27.67 kg/m as calculated from the following:   Height as of this encounter: 6' (1.829 m).   Weight as of this encounter: 204 lb (92.5 kg).  Risk Assessment: Allergies: Reviewed. She is allergic to sulfa antibiotics, darvon [propoxyphene], other, and tape.  Allergy Precautions: None required Coagulopathies: Reviewed. None identified.  Blood-thinner therapy: None at this time Active Infection(s): Reviewed. None identified. Stacy Cunningham is afebrile  Site Confirmation: Ms.  Cunningham was asked to confirm the procedure and laterality before marking the site Procedure checklist: Completed Consent: Before the procedure and under the influence of no sedative(s), amnesic(s), or  anxiolytics, the patient was informed of the treatment options, risks and possible complications. To fulfill our ethical and legal obligations, as recommended by the American Medical Association's Code of Ethics, I have informed the patient of my clinical impression; the nature and purpose of the treatment or procedure; the risks, benefits, and possible complications of the intervention; the alternatives, including doing nothing; the risk(s) and benefit(s) of the alternative treatment(s) or procedure(s); and the risk(s) and benefit(s) of doing nothing. The patient was provided information about the general risks and possible complications associated with the procedure. These may include, but are not limited to: failure to achieve desired goals, infection, bleeding, organ or nerve damage, allergic reactions, paralysis, and death. In addition, the patient was informed of those risks and complications associated to Spine-related procedures, such as failure to decrease pain; infection (i.e.: Meningitis, epidural or intraspinal abscess); bleeding (i.e.: epidural hematoma, subarachnoid hemorrhage, or any other type of intraspinal or peri-dural bleeding); organ or nerve damage (i.e.: Any type of peripheral nerve, nerve root, or spinal cord injury) with subsequent damage to sensory, motor, and/or autonomic systems, resulting in permanent pain, numbness, and/or weakness of one or several areas of the body; allergic reactions; (i.e.: anaphylactic reaction); and/or death. Furthermore, the patient was informed of those risks and complications associated with the medications. These include, but are not limited to: allergic reactions (i.e.: anaphylactic or anaphylactoid reaction(s)); adrenal axis suppression; blood sugar  elevation that in diabetics may result in ketoacidosis or comma; water retention that in patients with history of congestive heart failure may result in shortness of breath, pulmonary edema, and decompensation with resultant heart failure; weight gain; swelling or edema; medication-induced neural toxicity; particulate matter embolism and blood vessel occlusion with resultant organ, and/or nervous system infarction; and/or aseptic necrosis of one or more joints. Finally, the patient was informed that Medicine is not an exact science; therefore, there is also the possibility of unforeseen or unpredictable risks and/or possible complications that may result in a catastrophic outcome. The patient indicated having understood very clearly. We have given the patient no guarantees and we have made no promises. Enough time was given to the patient to ask questions, all of which were answered to the patient's satisfaction. Ms. Brendel has indicated that she wanted to continue with the procedure. Attestation: I, the ordering provider, attest that I have discussed with the patient the benefits, risks, side-effects, alternatives, likelihood of achieving goals, and potential problems during recovery for the procedure that I have provided informed consent. Date  Time: 11/24/2020 12:29 PM  Pre-Procedure Preparation:  Monitoring: As per clinic protocol. Respiration, ETCO2, SpO2, BP, heart rate and rhythm monitor placed and checked for adequate function Safety Precautions: Patient was assessed for positional comfort and pressure points before starting the procedure. Time-out: I initiated and conducted the "Time-out" before starting the procedure, as per protocol. The patient was asked to participate by confirming the accuracy of the "Time Out" information. Verification of the correct person, site, and procedure were performed and confirmed by me, the nursing staff, and the patient. "Time-out" conducted as per Joint Commission's  Universal Protocol (UP.01.01.01). Time: 1316  Description of Procedure:          Target Area: The interlaminar space, initially targeting the lower laminar border of the superior vertebral body. Approach: Paramedial approach. Area Prepped: Entire Posterior Lumbar Region DuraPrep (Iodine Povacrylex [0.7% available iodine] and Isopropyl Alcohol, 74% w/w) Safety Precautions: Aspiration looking for blood return was conducted prior to all injections. At no point  did we inject any substances, as a needle was being advanced. No attempts were made at seeking any paresthesias. Safe injection practices and needle disposal techniques used. Medications properly checked for expiration dates. SDV (single dose vial) medications used. Description of the Procedure: Protocol guidelines were followed. The procedure needle was introduced through the skin, ipsilateral to the reported pain, and advanced to the target area. Bone was contacted and the needle walked caudad, until the lamina was cleared. The epidural space was identified using "loss-of-resistance technique" with 2-3 ml of PF-NaCl (0.9% NSS), in a 5cc LOR glass syringe.  Vitals:   11/24/20 1235 11/24/20 1314 11/24/20 1320 11/24/20 1325  BP: 110/72 124/76 (!) 118/98 121/76  Pulse: 83 90 89 90  Resp: '16 15 15 16  '$ Temp: (!) 96.6 F (35.9 C)     TempSrc: Temporal     SpO2: 100% 100% 97% 98%  Weight: 204 lb (92.5 kg)     Height: 6' (1.829 m)       Start Time: 1316 hrs. End Time: 1321 hrs.  Materials:  Needle(s) Type: Epidural needle Gauge: 22G Length: 3.5-in Medication(s): Please see orders for medications and dosing details. 6 cc solution made of 3 cc of preservative-free saline, 2 cc of 0.2% ropivacaine, 1 cc of Decadron 10 mg/cc.  Imaging Guidance (Spinal):          Type of Imaging Technique: Fluoroscopy Guidance (Spinal) Indication(s): Assistance in needle guidance and placement for procedures requiring needle placement in or near specific  anatomical locations not easily accessible without such assistance. Exposure Time: Please see nurses notes. Contrast: Before injecting any contrast, we confirmed that the patient did not have an allergy to iodine, shellfish, or radiological contrast. Once satisfactory needle placement was completed at the desired level, radiological contrast was injected. Contrast injected under live fluoroscopy. No contrast complications. See chart for type and volume of contrast used. Fluoroscopic Guidance: I was personally present during the use of fluoroscopy. "Tunnel Vision Technique" used to obtain the best possible view of the target area. Parallax error corrected before commencing the procedure. "Direction-depth-direction" technique used to introduce the needle under continuous pulsed fluoroscopy. Once target was reached, antero-posterior, oblique, and lateral fluoroscopic projection used confirm needle placement in all planes. Images permanently stored in EMR. Interpretation: I personally interpreted the imaging intraoperatively. Adequate needle placement confirmed in multiple planes. Appropriate spread of contrast into desired area was observed. No evidence of afferent or efferent intravascular uptake. No intrathecal or subarachnoid spread observed. Permanent images saved into the patient's record.   Post-operative Assessment:  Post-procedure Vital Signs:  Pulse/HCG Rate: 90 (nsr)  Temp: (!) 96.6 F (35.9 C) Resp: 16 BP: 121/76 SpO2: 98 %  EBL: None  Complications: No immediate post-treatment complications observed by team, or reported by patient.  Note: The patient tolerated the entire procedure well. A repeat set of vitals were taken after the procedure and the patient was kept under observation following institutional policy, for this type of procedure. Post-procedural neurological assessment was performed, showing return to baseline, prior to discharge. The patient was provided with post-procedure  discharge instructions, including a section on how to identify potential problems. Should any problems arise concerning this procedure, the patient was given instructions to immediately contact us, at any time, without hesitation. In any case, we plan to contact the patient by telephone for a follow-up status report regarding this interventional procedure.  Comments:  No additional relevant information.  Plan of Care  Orders:  Orders Placed This Encounter  Procedures   DG [facility] C-ARM 1-60 MIN NO REPORT    Intraoperative interpretation by procedural physician at [facility].    Standing Status:   Standing    Number of Occurrences:   1    Order Specific Question:   Reason for exam:    Answer:   Assistance in needle guidance and placement for procedures requiring needle placement in or near specific anatomical locations not easily accessible without such assistance.     Medications ordered for procedure: Meds ordered this encounter  Medications   iohexol (OMNIPAQUE) 180 MG/ML injection 10 mL    Must be Myelogram-compatible. If not available, you may substitute with a water-soluble, non-ionic, hypoallergenic, myelogram-compatible radiological contrast medium.   lidocaine (XYLOCAINE) 2 % (with pres) injection 400 mg   ropivacaine (PF) 2 mg/mL (0.2%) (NAROPIN) injection 2 mL   sodium chloride flush (NS) 0.9 % injection 2 mL   dexamethasone (DECADRON) injection 10 mg   diazepam (VALIUM) tablet 5 mg   Medications administered: We administered iohexol, lidocaine, ropivacaine (PF) 2 mg/mL (0.2%), sodium chloride flush, dexamethasone, and diazepam.  See the medical record for exact dosing, route, and time of administration.  Follow-up plan:   Return in about 4 weeks (around 12/22/2020) for Post Procedure Evaluation.      Right L4/5 ESI 11/24/20   Recent Visits Date Type Provider Dept  11/09/20 Office Visit Gillis Santa, MD [facility]  Showing recent visits within  past 90 days and meeting all other requirements Today's Visits Date Type Provider Dept  11/24/20 Procedure visit Gillis Santa, MD [facility]  Showing today's visits and meeting all other requirements Future Appointments Date Type Provider Dept  12/22/20 Appointment Gillis Santa, MD [facility]  Showing future appointments within next 90 days and meeting all other requirements Disposition: Discharge home  Discharge (Date  Time): 11/24/2020; 1335 hrs.   Primary Care Physician: [name], MD Location: [facility] Note by: Gillis Santa, MD Date: 11/24/2020; Time: 1:46 PM  Disclaimer:  Medicine is not an exact science. The only guarantee in medicine is that nothing is guaranteed. It is important to note that the decision to proceed with this intervention was based on the information collected from the patient. The Data and conclusions were drawn from the patient's questionnaire, the interview, and the physical examination. Because the information was provided in large part by the patient, it cannot be guaranteed that it has not been purposely or unconsciously manipulated. Every effort has been made to obtain as much relevant data as possible for this evaluation. It is important to note that the conclusions that lead to this procedure are derived in large part from the available data. Always take into account that the treatment will also be dependent on availability of resources and existing treatment guidelines, considered by other Pain Management Practitioners as being common knowledge and practice, at the time of the intervention. For Medico-Legal purposes, it is also important to point out that variation in procedural techniques and pharmacological choices are the acceptable norm. The indications, contraindications, technique, and results of the above procedure should only be interpreted and judged by a Board-Certified Interventional Pain  Specialist with extensive familiarity and expertise in the same exact procedure and technique.

## 2020-11-24 NOTE — Progress Notes (Signed)
Safety precautions to be maintained throughout the outpatient stay will include: orient to surroundings, keep bed in low position, maintain call bell within reach at all times, provide assistance with transfer out of bed and ambulation.  

## 2020-11-24 NOTE — Patient Instructions (Signed)

## 2020-11-25 ENCOUNTER — Telehealth: Payer: Self-pay | Admitting: *Deleted

## 2020-11-25 NOTE — Telephone Encounter (Signed)
Denies any post procedure issues. 

## 2020-11-29 ENCOUNTER — Ambulatory Visit
Admission: RE | Admit: 2020-11-29 | Discharge: 2020-11-29 | Disposition: A | Discharge status: Home / Self Care | Payer: Medicare Other | Source: Ambulatory Visit | Attending: Physician Assistant | Admitting: Physician Assistant

## 2020-11-29 ENCOUNTER — Other Ambulatory Visit: Payer: Self-pay

## 2020-11-29 ENCOUNTER — Ambulatory Visit
Admission: RE | Admit: 2020-11-29 | Discharge: 2020-11-29 | Disposition: A | Discharge status: Home / Self Care | Payer: Medicare Other | Source: Ambulatory Visit | Attending: Family Medicine | Admitting: Family Medicine

## 2020-11-29 DIAGNOSIS — E119 Type 2 diabetes mellitus without complications: Secondary | ICD-10-CM | POA: Insufficient documentation

## 2020-11-29 DIAGNOSIS — Z1231 Encounter for screening mammogram for malignant neoplasm of breast: Secondary | ICD-10-CM | POA: Insufficient documentation

## 2020-11-29 DIAGNOSIS — Z780 Asymptomatic menopausal state: Secondary | ICD-10-CM | POA: Insufficient documentation

## 2020-11-29 DIAGNOSIS — Z13820 Encounter for screening for osteoporosis: Secondary | ICD-10-CM | POA: Diagnosis present

## 2020-12-13 ENCOUNTER — Telehealth: Payer: Self-pay | Admitting: Student in an Organized Health Care Education/Training Program

## 2020-12-13 ENCOUNTER — Other Ambulatory Visit: Payer: Self-pay | Admitting: Student in an Organized Health Care Education/Training Program

## 2020-12-13 DIAGNOSIS — M48062 Spinal stenosis, lumbar region with neurogenic claudication: Secondary | ICD-10-CM

## 2020-12-13 DIAGNOSIS — G894 Chronic pain syndrome: Secondary | ICD-10-CM

## 2020-12-13 DIAGNOSIS — M5416 Radiculopathy, lumbar region: Secondary | ICD-10-CM

## 2020-12-13 DIAGNOSIS — G8929 Other chronic pain: Secondary | ICD-10-CM

## 2020-12-13 NOTE — Telephone Encounter (Signed)
Patient lvmail stating she is out of Lyrica and needs refill. She has [facility] appt on 8-31- w/Dr. Holley Raring. Please advise patient.

## 2020-12-14 ENCOUNTER — Other Ambulatory Visit: Payer: Self-pay | Admitting: Student in an Organized Health Care Education/Training Program

## 2020-12-14 ENCOUNTER — Telehealth: Payer: Self-pay | Admitting: Student in an Organized Health Care Education/Training Program

## 2020-12-14 DIAGNOSIS — G894 Chronic pain syndrome: Secondary | ICD-10-CM

## 2020-12-14 DIAGNOSIS — M5416 Radiculopathy, lumbar region: Secondary | ICD-10-CM

## 2020-12-14 DIAGNOSIS — G8929 Other chronic pain: Secondary | ICD-10-CM

## 2020-12-14 DIAGNOSIS — M48062 Spinal stenosis, lumbar region with neurogenic claudication: Secondary | ICD-10-CM

## 2020-12-14 MED ORDER — PREGABALIN 50 MG PO CAPS: 50.0000 mg | ORAL_CAPSULE | Freq: Three times a day (TID) | ORAL | 2 refills | Status: DC

## 2020-12-14 NOTE — Telephone Encounter (Signed)
Pt said she is out of her Lyrica and is in a lot of pain

## 2020-12-14 NOTE — Telephone Encounter (Signed)
Patient informed that script for Lyrica was sent to pharmacy today.

## 2020-12-14 NOTE — Progress Notes (Signed)
Patient called and made aware.

## 2020-12-14 NOTE — Telephone Encounter (Signed)
Sent bubble to Dr. Holley Raring for approval.

## 2020-12-20 ENCOUNTER — Telehealth: Payer: Self-pay

## 2020-12-20 ENCOUNTER — Encounter: Payer: Self-pay | Admitting: Student in an Organized Health Care Education/Training Program

## 2020-12-20 NOTE — Telephone Encounter (Signed)
Dr Holley Raring, when I called this patient for her pre virtual appointment questions, she mentioned being on Gabapentin.  This was not on her list of medications.  She is also taking Lyrica.  It is my understanding that she should not be taking both of these.  You have a phone appointment with her tomorrow.  She said she had been on the Gabapentin for 18  years.

## 2020-12-21 ENCOUNTER — Other Ambulatory Visit: Payer: Self-pay

## 2020-12-21 ENCOUNTER — Ambulatory Visit
Payer: Medicare Other | Attending: Student in an Organized Health Care Education/Training Program | Admitting: Student in an Organized Health Care Education/Training Program

## 2020-12-21 DIAGNOSIS — M5412 Radiculopathy, cervical region: Secondary | ICD-10-CM | POA: Diagnosis present

## 2020-12-21 DIAGNOSIS — G894 Chronic pain syndrome: Secondary | ICD-10-CM | POA: Insufficient documentation

## 2020-12-21 DIAGNOSIS — M48062 Spinal stenosis, lumbar region with neurogenic claudication: Secondary | ICD-10-CM | POA: Diagnosis present

## 2020-12-21 DIAGNOSIS — M5416 Radiculopathy, lumbar region: Secondary | ICD-10-CM | POA: Insufficient documentation

## 2020-12-21 DIAGNOSIS — M47816 Spondylosis without myelopathy or radiculopathy, lumbar region: Secondary | ICD-10-CM | POA: Insufficient documentation

## 2020-12-21 DIAGNOSIS — G8929 Other chronic pain: Secondary | ICD-10-CM | POA: Diagnosis present

## 2020-12-21 MED ORDER — ALPHA-LIPOIC ACID 600 MG PO CAPS: 600.0000 mg | ORAL_CAPSULE | Freq: Every day | ORAL | 2 refills | Status: AC

## 2020-12-21 NOTE — Progress Notes (Signed)
Patient: Stacy Cunningham  Service Category: E/M  Provider: Gillis Santa, Cunningham  DOB: [birth year]  DOS: 12/21/2020  Location: Office  MRN: [id number]  Setting: Ambulatory outpatient  Referring Provider: Theotis Cunningham*  Type: Established Patient  Specialty: Interventional Pain Management  PCP: Stacy Burrow, Cunningham  Location: Home  Delivery: TeleHealth     Virtual Encounter - Pain Management PROVIDER NOTE: Information contained herein reflects review and annotations entered in association with encounter. Interpretation of such information and data should be left to medically-trained personnel. Information provided to patient can be located elsewhere in the medical record under "Patient Instructions". Document created using STT-dictation technology, any transcriptional errors that may result from process are unintentional.    Contact & Pharmacy Preferred: [phone] Home: [phone] (home) Mobile: [phone] (mobile) E-mail: Stacy Cunningham, Stacy Cunningham Phone: [phone] Fax: [phone]  [id number] [place] [id number]: [phone]: [address] [place] [address] [address]Phone: [phone]: [phone]  Pre-screening  Stacy Cunningham offered "in-person" vs "virtual" encounter. She indicated preferring virtual for this encounter.   Reason COVID-19*  Social distancing based on CDC and AMA recommendations.   I contacted Stacy Cunningham 12/21/2020 via telephone.      I clearly identified myself as BGillis Santa Cunningham. I verified that I was speaking with the correct person using two identifiers (Name: AHaylynn Cunningham and date of birth: [birth year], [place].  Consent I sought verbal advanced consent from Stacy Cunningham for virtual visit interactions. I informed Ms. MChamberlinof possible security and privacy concerns, risks, and limitations associated with providing "not-in-person" medical evaluation and management services. I also informed Ms. [name] of the availability of "in-person" appointments. Finally, I informed her that there would be Stacy charge for the virtual visit and that she could be  personally, fully or partially, financially responsible for it. Ms. MSzczerbaexpressed understanding and agreed to proceed.   Historic Elements   Ms. Stacy Mirelesis Stacy 70y.o. year old, female patient evaluated today after our last contact on 12/14/2020. Stacy Cunningham has Stacy past medical history of Anemia, Diabetes mellitus without complication ([place], Elevated lipids, GERD (gastroesophageal reflux disease), Glaucoma, Headache, Hypertension, Neuropathy, Neuropathy, Neuropathy, and Panic attack. She also  has Stacy past surgical history that includes Colonoscopy with propofol (N/Stacy, 07/13/2017); Esophagogastroduodenoscopy (egd) with propofol (N/Stacy, 09/14/2017); Cholecystectomy (N/Stacy, 02/06/2018); and Anterior cervical decomp/discectomy fusion (N/Stacy, 09/30/2018). Ms. MSenathas Stacy current medication list which includes the following prescription(s): accu-chek softclix lancets, acetaminophen, alpha-lipoic acid, aspirin, brimonidine, clotrimazole, clotrimazole-betamethasone, combigan, dorzolamide-timolol, famotidine, ferrous sulfate, fluticasone, gabapentin, hydrochlorothiazide, lidocaine, lisinopril, metformin, timolol, duloxetine, and simvastatin. She  reports that she has never smoked. She has never used smokeless tobacco. She reports that she does not currently use alcohol. She reports that she does not use drugs. Ms. Stacy Cunningham allergic to sulfa antibiotics, darvon [propoxyphene], other, and tape.   HPI  Today, she is being contacted for Stacy post-procedure assessment.   Post-Procedure Evaluation  Procedure (11/24/2020):   Type: Therapeutic Inter-Laminar  Epidural Steroid Injection           Region: Lumbar Level: L4-5 Level. Laterality: Right-Sided     Anxiolysis: Please see nurses note.  Effectiveness during initial hour after procedure (Ultra-Short Term Relief): 100 %   Local anesthetic  used: Long-acting (4-6 hours) Effectiveness: Defined as any analgesic benefit obtained secondary to the administration of local anesthetics. This carries significant diagnostic value as to the etiological location, or anatomical origin, of the pain. Duration of benefit is expected to coincide with the duration of the local anesthetic used.  Effectiveness during initial 4-6 hours after procedure (Short-Term Relief): 100 %   Long-term benefit: Defined as any relief past the pharmacologic duration of the local anesthetics.  Effectiveness past the initial 6 hours after procedure (Long-Term Relief): 100 % (lasting 5 days)   Benefits, current: Defined as benefit present at the time of this evaluation.   Analgesia:  then down to 75% for 2-3 days and then back to baseline   Laboratory Chemistry Profile   Renal Lab Results  Component Value Date   BUN 14 09/18/2018   CREATININE 0.68 09/18/2018   GFRAA >60 09/18/2018   GFRNONAA >60 09/18/2018    Hepatic Lab Results  Component Value Date   AST 70 (H) 06/05/2018   ALT 38 06/05/2018   ALBUMIN 3.9 06/05/2018   ALKPHOS 57 06/05/2018   LIPASE 47 06/05/2018    Electrolytes Lab Results  Component Value Date   NA 139 09/18/2018   K 3.8 09/18/2018   CL 102 09/18/2018   CALCIUM 8.8 (L) 09/18/2018    Bone No results found for: VD25OH, VD125OH2TOT, PO2423NT6, RW4315QM0, 25OHVITD1, 25OHVITD2, 25OHVITD3, TESTOFREE, TESTOSTERONE  Inflammation (CRP: Acute Phase) (ESR: Chronic Phase) No results found for: CRP, ESRSEDRATE, LATICACIDVEN       Note: Above Lab results reviewed.  Imaging  MM 3D SCREEN BREAST BILATERAL CLINICAL DATA:  Screening.  EXAM: DIGITAL SCREENING BILATERAL MAMMOGRAM WITH TOMOSYNTHESIS AND  CAD  TECHNIQUE: Bilateral screening digital craniocaudal and mediolateral oblique mammograms were obtained. Bilateral screening digital breast tomosynthesis was performed. The images were evaluated with computer-aided detection.  COMPARISON:  Previous exam(s).  ACR Breast Density Category b: There are scattered areas of fibroglandular density.  FINDINGS: There are no findings suspicious for malignancy.  IMPRESSION: No mammographic evidence of malignancy. Stacy result letter of this screening mammogram will be mailed directly to the patient.  RECOMMENDATION: Screening mammogram in one year. (Code:[id number]-B-01Y)  BI-RADS CATEGORY  1: Negative.  Electronically Signed   By: Abelardo Diesel M.D.   On: 11/30/2020 11:06  Assessment  The primary encounter diagnosis was Chronic radicular lumbar pain. Diagnoses of Lumbar radiculopathy, Spinal stenosis, lumbar region, with neurogenic claudication, Cervical radicular pain (left C3/4), Lumbar facet arthropathy, and Chronic pain syndrome were also pertinent to this visit.  Plan of Care   Ms. Rielyn Krupinski has Stacy current medication list which includes the following long-term medication(s): famotidine, ferrous sulfate, hydrochlorothiazide, lisinopril, metformin, and duloxetine.  Positive response to lumbar epidural steroid injection albeit not as long as we had hoped.  Discussed repeating lumbar epidural steroid injection and adding additional volume to see if that prolongs analgesic benefit.  Patient is on gabapentin and Lyrica.  I recommend that she not be on 2 calcium channel modulators.  Recommend that she discontinue Lyrica.  Start alpha lipoic acid for neuropathic pain as below.   Pharmacotherapy (Medications Ordered): Meds ordered this encounter  Medications   Alpha-Lipoic Acid 600 MG CAPS    Sig: Take 1 capsule (600 mg total) by mouth daily.    Dispense:  30 capsule    Refill:  2    Do not place medication on "Automatic Refill". Fill one  day early if pharmacy is closed on scheduled refill date.  Orders:  Orders Placed This Encounter  Procedures   Lumbar Epidural Injection    Standing Status:   Future    Standing Expiration Date:   01/21/2021    Scheduling Instructions:     Procedure: Interlaminar Lumbar Epidural Steroid injection (LESI)            Laterality: Midline     Sedation: Patient's choice.     Timeframe: ASAA    Order Specific Question:   Where will this procedure be performed?    Answer:   [facility]    Follow-up plan:   Return in about 2 weeks (around 01/04/2021) for R L4/5 ESI , minimal sedation (PO Valium).     Right L4/5 ESI 11/24/20    Recent Visits Date Type Provider Dept  11/24/20 Procedure visit Stacy Santa, Cunningham [facility]  11/09/20 Office Visit Stacy Santa, Cunningham [facility]  Showing recent visits within past 90 days and meeting all other requirements Today's Visits Date Type Provider Dept  12/21/20 Telemedicine Stacy Santa, Cunningham [facility]  Showing today's visits and meeting all other requirements Future Appointments No visits were found meeting these conditions. Showing future appointments within next 90 days and meeting all other requirements I discussed the assessment and treatment plan with the patient. The patient was provided an opportunity to ask questions and all were answered. The patient agreed with the plan and demonstrated an understanding of the instructions.  Patient advised to call back or seek an in-person evaluation if the symptoms or condition worsens.  Duration of encounter: 60mnutes.  Note by: BGillis Santa Cunningham Date: 12/21/2020; Time: 2:08 PM

## 2020-12-22 ENCOUNTER — Telehealth: Payer: Self-pay

## 2020-12-22 ENCOUNTER — Telehealth: Payer: Medicare Other | Admitting: Student in an Organized Health Care Education/Training Program

## 2020-12-22 NOTE — Telephone Encounter (Signed)
Pre procedure instructions given via phone.  No blood thinners.  Ok to schedule when you get approval.

## 2020-12-30 ENCOUNTER — Other Ambulatory Visit: Payer: Self-pay | Admitting: Family Medicine

## 2020-12-30 DIAGNOSIS — D259 Leiomyoma of uterus, unspecified: Secondary | ICD-10-CM

## 2020-12-30 DIAGNOSIS — R918 Other nonspecific abnormal finding of lung field: Secondary | ICD-10-CM

## 2021-01-19 ENCOUNTER — Ambulatory Visit
Admission: RE | Admit: 2021-01-19 | Discharge: 2021-01-19 | Disposition: A | Discharge status: Home / Self Care | Payer: Medicare Other | Source: Ambulatory Visit | Attending: Family Medicine | Admitting: Family Medicine

## 2021-01-19 ENCOUNTER — Telehealth: Payer: Self-pay

## 2021-01-19 ENCOUNTER — Other Ambulatory Visit: Payer: Self-pay

## 2021-01-19 DIAGNOSIS — D259 Leiomyoma of uterus, unspecified: Secondary | ICD-10-CM

## 2021-01-19 DIAGNOSIS — R918 Other nonspecific abnormal finding of lung field: Secondary | ICD-10-CM | POA: Diagnosis present

## 2021-01-19 MED ORDER — IOHEXOL 350 MG/ML SOLN
60.0000 mL | Freq: Once | INTRAVENOUS | Status: AC | PRN
  Administered 2021-01-19: 60 mL via INTRAVENOUS

## 2021-01-19 NOTE — Telephone Encounter (Signed)
Pt needs a rx that her insurance will cover she said the ine that Dr.Lateef gave her she can not afford

## 2021-01-20 NOTE — Telephone Encounter (Signed)
Called and discussed this with the patient, she is referring to the Alpha-Lipoic Acid 600mg  caps. I told her that she could  buy this OTC and she wants a prescription for something else, so she could get it cheaper.

## 2021-01-24 ENCOUNTER — Telehealth: Payer: Self-pay | Admitting: *Deleted

## 2021-01-24 NOTE — Telephone Encounter (Signed)
Currently takes Gabapentin, prescribed by Dr. Reather Laurence. Amitriptyline causes hallucinations. Not sure about Nortriptyline.

## 2021-01-24 NOTE — Telephone Encounter (Signed)
You prescribed Alpha Lipoic Acid instead of Lyrica. She is asking for something her insurance will cover. She cannot pay for OTC med.

## 2021-01-25 NOTE — Telephone Encounter (Signed)
Patient informed of this.

## 2021-01-28 ENCOUNTER — Other Ambulatory Visit: Payer: Self-pay | Admitting: Family Medicine

## 2021-01-28 DIAGNOSIS — E041 Nontoxic single thyroid nodule: Secondary | ICD-10-CM

## 2021-02-08 ENCOUNTER — Other Ambulatory Visit: Payer: Self-pay

## 2021-02-08 ENCOUNTER — Ambulatory Visit
Admission: RE | Admit: 2021-02-08 | Discharge: 2021-02-08 | Disposition: A | Discharge status: Home / Self Care | Payer: Medicare Other | Source: Ambulatory Visit | Attending: Family Medicine | Admitting: Family Medicine

## 2021-02-08 DIAGNOSIS — E041 Nontoxic single thyroid nodule: Secondary | ICD-10-CM | POA: Diagnosis not present

## 2021-02-14 ENCOUNTER — Other Ambulatory Visit: Payer: Self-pay | Admitting: Family Medicine

## 2021-02-14 DIAGNOSIS — E041 Nontoxic single thyroid nodule: Secondary | ICD-10-CM

## 2021-02-24 ENCOUNTER — Other Ambulatory Visit: Payer: Self-pay | Admitting: Obstetrics and Gynecology

## 2021-02-24 ENCOUNTER — Other Ambulatory Visit (HOSPITAL_COMMUNITY): Payer: Self-pay | Admitting: Obstetrics and Gynecology

## 2021-02-24 DIAGNOSIS — R102 Pelvic and perineal pain: Secondary | ICD-10-CM

## 2021-02-24 DIAGNOSIS — D259 Leiomyoma of uterus, unspecified: Secondary | ICD-10-CM

## 2021-02-28 ENCOUNTER — Other Ambulatory Visit: Payer: Self-pay | Admitting: Otolaryngology

## 2021-02-28 DIAGNOSIS — E041 Nontoxic single thyroid nodule: Secondary | ICD-10-CM

## 2021-03-03 ENCOUNTER — Other Ambulatory Visit: Payer: Self-pay

## 2021-03-03 ENCOUNTER — Ambulatory Visit
Admission: RE | Admit: 2021-03-03 | Discharge: 2021-03-03 | Disposition: A | Discharge status: Home / Self Care | Payer: Medicare Other | Source: Ambulatory Visit | Attending: Obstetrics and Gynecology | Admitting: Obstetrics and Gynecology

## 2021-03-03 DIAGNOSIS — R102 Pelvic and perineal pain: Secondary | ICD-10-CM | POA: Insufficient documentation

## 2021-03-03 DIAGNOSIS — D259 Leiomyoma of uterus, unspecified: Secondary | ICD-10-CM | POA: Diagnosis present

## 2021-03-08 ENCOUNTER — Ambulatory Visit
Admission: RE | Admit: 2021-03-08 | Discharge: 2021-03-08 | Disposition: A | Discharge status: Home / Self Care | Payer: Medicare Other | Source: Ambulatory Visit | Attending: Otolaryngology | Admitting: Otolaryngology

## 2021-03-08 ENCOUNTER — Other Ambulatory Visit: Payer: Self-pay

## 2021-03-08 DIAGNOSIS — E041 Nontoxic single thyroid nodule: Secondary | ICD-10-CM | POA: Diagnosis present

## 2021-03-08 NOTE — Procedures (Signed)
Successful US guided FNA of left mid thyroid nodule No complications. See PACS for full report.  Hedy Jacob, PA-C 03/08/2021, 3:19 PM

## 2021-03-09 ENCOUNTER — Encounter: Payer: Medicare Other | Admitting: Obstetrics and Gynecology

## 2021-03-09 LAB — CYTOLOGY - NON PAP

## 2021-03-10 ENCOUNTER — Ambulatory Visit: Payer: Medicare Other | Admitting: Student in an Organized Health Care Education/Training Program

## 2021-03-31 ENCOUNTER — Ambulatory Visit: Payer: Medicare Other | Admitting: Student in an Organized Health Care Education/Training Program

## 2021-05-05 DIAGNOSIS — N840 Polyp of corpus uteri: Secondary | ICD-10-CM | POA: Insufficient documentation

## 2021-05-24 ENCOUNTER — Encounter: Payer: Self-pay | Admitting: Medical

## 2021-05-24 ENCOUNTER — Other Ambulatory Visit: Payer: Self-pay

## 2021-05-24 ENCOUNTER — Ambulatory Visit (INDEPENDENT_AMBULATORY_CARE_PROVIDER_SITE_OTHER): Payer: Medicare Other

## 2021-05-24 ENCOUNTER — Ambulatory Visit (INDEPENDENT_AMBULATORY_CARE_PROVIDER_SITE_OTHER): Payer: Medicare Other | Admitting: Medical

## 2021-05-24 ENCOUNTER — Ambulatory Visit: Payer: Medicare Other | Admitting: Medical

## 2021-05-24 ENCOUNTER — Other Ambulatory Visit
Admission: RE | Admit: 2021-05-24 | Discharge: 2021-05-24 | Disposition: A | Discharge status: Home / Self Care | Payer: Medicare Other | Attending: Medical | Admitting: Medical

## 2021-05-24 VITALS — BP 120/80 | HR 90 | Ht 66.0 in | Wt 196.0 lb

## 2021-05-24 DIAGNOSIS — R002 Palpitations: Secondary | ICD-10-CM

## 2021-05-24 DIAGNOSIS — I10 Essential (primary) hypertension: Secondary | ICD-10-CM

## 2021-05-24 DIAGNOSIS — E782 Mixed hyperlipidemia: Secondary | ICD-10-CM | POA: Diagnosis not present

## 2021-05-24 LAB — MAGNESIUM: Magnesium: 1.5 mg/dL — ABNORMAL LOW (ref 1.7–2.4)

## 2021-05-24 LAB — TSH: TSH: 1.157 u[IU]/mL (ref 0.350–4.500)

## 2021-05-24 NOTE — Patient Instructions (Signed)
Medication Instructions:   Your physician recommends that you continue on your current medications as directed. Please refer to the Current Medication list given to you today.   *If you need a refill on your cardiac medications before your next appointment, please call your pharmacy*   Lab Work:  Your provider has ordered labs (magnesium, TSH). Please go to the [place] to have these drawn.   Medical Mall Entrance at [facility] 1st desk on the right to check in, past the screening table Lab hours: Monday- Friday (7:30 am- 5:30 pm)   If you have labs (blood work) drawn today and your tests are completely normal, you will receive your results only by: [place] (if you have MyChart) OR A paper copy in the mail If you have any lab test that is abnormal or we need to change your treatment, we will call you to review the results.   Testing/Procedures:  Your provider has ordered a heart monitor to wear for 14 days. This will be mailed to your home with instructions on placement. Once you have finished the time frame requested, you will return monitor in box provided.      Follow-Up: At [facility], you and your health needs are our priority.  As part of our continuing mission to provide you with exceptional heart care, we have created designated Provider Care Teams.  These Care Teams include your primary Cardiologist (physician) and Advanced Practice Providers (APPs -  Physician Assistants and Nurse Practitioners) who all work together to provide you with the care you need, when you need it.  We recommend signing up for the patient portal called "MyChart".  Sign up information is provided on this After Visit Summary.  MyChart is used to connect with patients for Virtual Visits (Telemedicine).  Patients are able to view lab/test results, encounter notes, upcoming appointments, etc.  Non-urgent messages can be sent to your provider as well.   To learn more about what you can do with  MyChart, go to [email or web address].    Your next appointment:   6-8 week(s)  The format for your next appointment:   In Person  Provider:   You may see Kate Sable, MD or one of the following Advanced Practice Providers on your designated Care Team:   Murray Hodgkins, NP Christell Faith, PA-C Cadence Kathlen Mody, PA-C:1}    Other Instructions N/A

## 2021-05-24 NOTE — Progress Notes (Signed)
Cardiology Office Note:    Date:  05/24/2021   ID:  Stacy Cunningham, DOB 10-10-1950, MRN [id number]  PCP:  [name], MD  [facility] Cardiologist:  Kate Sable, MD  [place] Electrophysiologist:  None   Referring MD: [name]*   Chief Complaint: ED follow-up palpitations  History of Present Illness:    Stacy Cunningham is a 71 y.o. female with a hx of HTN, HLD, DM2 who presents for follow-up.   Patient has been seen for palpitations in the past. Heart monitor showed no significant arrhythmias, 1 run of SVT.  Overall benign monitor.  Patient seen in the ER 05/17/2020 for palpitations.  Work-up was overall reassuring.`  Today, the patient reports intermittent palpitations. Feels fluttering in her heart. Symptoms started last year, not worse. They occur maybe once or twice a week. Occur for only a couple seconds. No known triggers. No chest pain, SOB, LLE, orthopnea, pnd.    Past Medical History:  Diagnosis Date   Anemia    Diabetes mellitus without complication (HCC)    Borderline Diabetes   Elevated lipids    GERD (gastroesophageal reflux disease)    Glaucoma    Headache    h/o migraines   Hypertension    Neuropathy    Neuropathy    Neuropathy    Panic attack     Past Surgical History:  Procedure Laterality Date   ANTERIOR CERVICAL DECOMP/DISCECTOMY FUSION N/A 09/30/2018   Procedure: ANTERIOR CERVICAL DECOMPRESSION/DISCECTOMY FUSION 1 LEVEL - C 5/6;  Surgeon: Deetta Perla, MD;  Location: [facility];  Service: Neurosurgery;  Laterality: N/A;   CHOLECYSTECTOMY N/A 02/06/2018   Procedure: LAPAROSCOPIC CHOLECYSTECTOMY;  Surgeon: Herbert Pun, MD;  Location: [facility];  Service: General;  Laterality: N/A;   COLONOSCOPY WITH PROPOFOL N/A 07/13/2017   Procedure: COLONOSCOPY WITH PROPOFOL;  Surgeon: Virgel Manifold, MD;  Location: [facility];  Service: Endoscopy;  Laterality: N/A;   ESOPHAGOGASTRODUODENOSCOPY (EGD) WITH  PROPOFOL N/A 09/14/2017   Procedure: ESOPHAGOGASTRODUODENOSCOPY (EGD) WITH PROPOFOL;  Surgeon: Virgel Manifold, MD;  Location: [facility];  Service: Endoscopy;  Laterality: N/A;    Current Medications: Current Meds  Medication Sig   Accu-Chek Softclix Lancets lancets    acetaminophen (TYLENOL) 500 MG tablet Take 1,000 mg by mouth 2 (two) times daily as needed for moderate pain or headache.   aspirin 81 MG EC tablet Take by mouth.   brimonidine (ALPHAGAN) 0.15 % ophthalmic solution Place 1 drop into both eyes 3 (three) times daily.   clotrimazole (LOTRIMIN) 1 % cream Apply 1 application topically daily as needed (rash).   clotrimazole-betamethasone (LOTRISONE) cream APPLY 1 APPLICATION TOPICALLY 2 TIMES DAILY   COMBIGAN 0.2-0.5 % ophthalmic solution Apply 1 drop to eye 2 (two) times daily.   dorzolamide-timolol (COSOPT) 22.3-6.8 MG/ML ophthalmic solution SMARTSIG:In Eye(s)   DULoxetine (CYMBALTA) 30 MG capsule Take by mouth.   famotidine (PEPCID) 40 MG tablet Take 40 mg by mouth daily.   ferrous sulfate 325 (65 FE) MG tablet Take 325 mg by mouth 2 (two) times a day.    fluticasone (FLONASE) 50 MCG/ACT nasal spray Place into both nostrils.   gabapentin (NEURONTIN) 800 MG tablet Take 800 mg by mouth 3 (three) times daily.   hydrochlorothiazide (HYDRODIURIL) 25 MG tablet Take 25 mg by mouth daily.   lidocaine (LIDODERM) 5 % Place 2 patches onto the skin daily as needed (pain). Remove & Discard patch within 12 hours or as directed by MD   lisinopril (ZESTRIL) 2.5  MG tablet Take 2.5 mg by mouth daily.   metFORMIN (GLUCOPHAGE) 500 MG tablet Take 500-1,000 mg by mouth See admin instructions. Take 1000 mg in the morning and 500 mg in the evening   simvastatin (ZOCOR) 20 MG tablet Take 20 mg by mouth every evening.   timolol (BETIMOL) 0.5 % ophthalmic solution Place 1 drop into both eyes 2 (two) times daily.     Allergies:   Sulfa antibiotics, Darvon [propoxyphene], Other, and Tape    Social History   Socioeconomic History   Marital status: Married    Spouse name: Not on file   Number of children: Not on file   Years of education: Not on file   Highest education level: Not on file  Occupational History   Not on file  Tobacco Use   Smoking status: Never   Smokeless tobacco: Never  Vaping Use   Vaping Use: Never used  Substance and Sexual Activity   Alcohol use: Not Currently   Drug use: No   Sexual activity: Yes  Other Topics Concern   Not on file  Social History Narrative   Not on file   Social Determinants of Health   Financial Resource Strain: Not on file  Food Insecurity: Not on file  Transportation Needs: Not on file  Physical Activity: Not on file  Stress: Not on file  Social Connections: Not on file     Family History: The patient's family history includes Breast cancer (age of onset: 33) in her sister; Heart attack (age of onset: 110) in her mother; Hyperlipidemia in her mother; Hypertension in her mother.  ROS:   Please see the history of present illness.     All other systems reviewed and are negative.  EKGs/Labs/Other Studies Reviewed:    The following studies were reviewed today:  Heart monitor 05/2020 Patient had a min HR of 60 bpm, max HR of 148 bpm, and avg HR of 80 bpm. Predominant underlying rhythm was Sinus Rhythm. 1 run of Supraventricular Tachycardia occurred lasting 4 beats with a max rate of 148 bpm (avg 143 bpm). Isolated SVEs were rare  (<1.0%), SVE Couplets were rare (<1.0%), and no SVE Triplets were present. No Isolated VEs, VE Couplets, or VE Triplets were present.  Patient triggered events were associated with sinus rhythm  EKG:  EKG is  ordered today.  The ekg ordered today demonstrates NSR, 90bpm, nonspecific T wave changes, Qtc 468ms  Recent Labs: No results found for requested labs within last 8760 hours.  Recent Lipid Panel No results found for: CHOL, TRIG, HDL, CHOLHDL, VLDL, LDLCALC, LDLDIRECT   Physical  Exam:    VS:  BP 120/80 (BP Location: Left Arm, Patient Position: Sitting, Cuff Size: Normal)    Pulse 90    Ht 5\' 6"  (3.149 m)    Wt 196 lb (88.9 kg)    SpO2 98%    BMI 31.64 kg/m     Wt Readings from Last 3 Encounters:  05/24/21 196 lb (88.9 kg)  11/24/20 204 lb (92.5 kg)  11/09/20 204 lb (92.5 kg)     GEN:  Well nourished, well developed in no acute distress HEENT: Normal NECK: No JVD; No carotid bruits LYMPHATICS: No lymphadenopathy CARDIAC: RRR, no murmurs, rubs, gallops RESPIRATORY:  Clear to auscultation without rales, wheezing or rhonchi  ABDOMEN: Soft, non-tender, non-distended MUSCULOSKELETAL:  No edema; No deformity  SKIN: Warm and dry NEUROLOGIC:  Alert and oriented x 3 PSYCHIATRIC:  Normal affect   ASSESSMENT:  1. Palpitations   2. Essential hypertension   3. Hyperlipidemia, mixed    PLAN:    In order of problems listed above:  Palpitations Reports intermittent brief fluttering in her chest. No associated symptoms. Heart monitor last year only worn for a day due to skin irritation. It showed no significant arrhythmia, 1 run SVT lasting 4 beats. We will repeat 7 day heart monitor. Check TSH and Mag. Recent BMET and CBC reviewed.   HTN BP good today, continue current medications.   HLD Needs repeat labs, can order at follow-up. Continue simvastatin.  Disposition: Follow up in 6-8 week(s) with MD/APP     Signed, Blue Winther Ninfa Meeker, PA-C  05/24/2021 2:52 PM    [facility]

## 2021-05-25 ENCOUNTER — Other Ambulatory Visit: Payer: Self-pay | Admitting: Student in an Organized Health Care Education/Training Program

## 2021-05-25 ENCOUNTER — Telehealth: Payer: Self-pay | Admitting: Student in an Organized Health Care Education/Training Program

## 2021-05-25 NOTE — Telephone Encounter (Signed)
Patient called wanting a refill on medication, alpha-lipoic acid, her last visit was in August 2022. I tried to set up appt for her but she became upset and wanted to speak with nurse or Dr. Holley Raring. Please advise patient

## 2021-05-25 NOTE — Telephone Encounter (Signed)
Patient would like to get refill on alpha-lipoic acid, she was last her in Aug of 22. I set her an appt up for 06-07-21 at 10:40 for MM. She says her legs are cramping real bad. Please advise patient.

## 2021-05-25 NOTE — Telephone Encounter (Signed)
Dr. Holley Raring does not prescribe Lipitor.  Patient has already called today about getting a script for Alpha Lipoic Acid, and I sent the message to Dr. Holley Raring.

## 2021-05-26 ENCOUNTER — Telehealth: Payer: Self-pay | Admitting: Student in an Organized Health Care Education/Training Program

## 2021-05-26 ENCOUNTER — Other Ambulatory Visit: Payer: Self-pay | Admitting: Student in an Organized Health Care Education/Training Program

## 2021-05-26 DIAGNOSIS — G8929 Other chronic pain: Secondary | ICD-10-CM

## 2021-05-26 MED ORDER — ALPHA-LIPOIC ACID 600 MG PO CAPS: 600.0000 mg | ORAL_CAPSULE | Freq: Every day | ORAL | 5 refills | Status: DC

## 2021-05-29 DIAGNOSIS — R002 Palpitations: Secondary | ICD-10-CM | POA: Diagnosis not present

## 2021-06-01 ENCOUNTER — Telehealth: Payer: Self-pay | Admitting: Emergency Medicine

## 2021-06-01 DIAGNOSIS — R002 Palpitations: Secondary | ICD-10-CM

## 2021-06-01 MED ORDER — MAGNESIUM OXIDE 400 MG PO CAPS: 400.0000 mg | ORAL_CAPSULE | Freq: Every day | ORAL | 0 refills | Status: DC

## 2021-06-01 NOTE — Telephone Encounter (Signed)
Called patient to go over results and recommendations with her. Pt verbalized understanding and questions (if any) were answered.  Magnesium oxide 400 mg daily sent into pharmacy.   Lab appointment scheduled for 3/8.

## 2021-06-01 NOTE — Telephone Encounter (Signed)
-----   Message from [place], PA-C sent at 05/30/2021  3:41 PM EST ----- Mag is low, Lets given 400mg  daily. Re-check mag in 1 month

## 2021-06-06 NOTE — Telephone Encounter (Signed)
I spoke with the patient. She had wanted to know if we checked her kidney function with her most recent lab results.  I advised the patient that on 05/24/21 Cadence, PA checked her TSH & magnesium levels. TSH was normal- magnesium was a little low at 1.5. The patient confirms she did pick up the magnesium RX that Judson Roch, RN called her about last week and she is taking this.  I have advised her that her kidney function was recently checked with [facility] on 05/17/21 and this was normal, so we did not need to recheck this with her last lab draw.  The patient has confirmed her appointments for: 06/29/21 @ 12 pm for lab work 07/06/21 @ 2 pm with Cadence, PA  Her heart monitor is currently pending, but the patient confirms she only wore this for 5 days as it started to break out her skin.  She is aware we will contact her with results once available.  The patient voices understanding of all of the above and was appreciative for the call back.

## 2021-06-06 NOTE — Telephone Encounter (Signed)
Patient calling to go over recent testing results .

## 2021-06-07 ENCOUNTER — Telehealth: Payer: Self-pay | Admitting: Cardiology

## 2021-06-07 ENCOUNTER — Encounter: Payer: Medicare Other | Admitting: Student in an Organized Health Care Education/Training Program

## 2021-06-07 NOTE — Telephone Encounter (Signed)
Patient calling with pain left arm & shoulder has concerns, please assist

## 2021-06-07 NOTE — Telephone Encounter (Signed)
Called and spoke with patient. She stated that a couple of weeks ago she went to a [facility] hospital in [place] for the same lingering  left arm and shoulder pain that she is still experiencing. She stated that she is still using a cream they suggested for her to use and it only helps for a little while.   The following note is from her ED visit on 05/18/21:  You were seen in the Emergency Department today for your palpitations. Your work up today including your EKG and lab work was all normal, which is reassuring. We did not see any evidence of heart attack / arrhythmia. Your shoulder pain appears to be musculoskeletal in nature.   You can try Tylenol (1000 mg every 12 hours), or topical lidocaine patches for pain relief. Lidocaine patches are available over the counter at your local pharmacy ([facility]).  Please follow up with your primary care physician in the next 2-3 days to schedule a re-evaluation regarding today's Emergency Department visit.     Patient is requesting to have a referral to an 'arm specialist'. I advised her that she needs to follow up with her PCP as recommended at the ED visit, as we do not give referrals for this.   Patient verbalized understanding and agreed with plan. She was very grateful for the call back.

## 2021-06-27 ENCOUNTER — Other Ambulatory Visit: Payer: Self-pay | Admitting: Medical

## 2021-06-29 ENCOUNTER — Other Ambulatory Visit: Payer: Self-pay

## 2021-06-29 ENCOUNTER — Other Ambulatory Visit (INDEPENDENT_AMBULATORY_CARE_PROVIDER_SITE_OTHER): Payer: Medicare Other

## 2021-06-29 DIAGNOSIS — R002 Palpitations: Secondary | ICD-10-CM

## 2021-06-30 LAB — MAGNESIUM: Magnesium: 1.7 mg/dL (ref 1.6–2.3)

## 2021-07-04 ENCOUNTER — Telehealth: Payer: Self-pay | Admitting: Cardiology

## 2021-07-04 NOTE — Telephone Encounter (Signed)
Patient calling to check if she needs to continue taking Magnesium, blood results came back normal. Please advise ?

## 2021-07-05 NOTE — Telephone Encounter (Signed)
Magnesium level obtained on 06/29/21 by Cadence Furth, PA- 1.7. ? ?To Cadence to review the patient's message as stated below. ? ? ?

## 2021-07-06 ENCOUNTER — Ambulatory Visit: Payer: Medicare Other | Admitting: Medical

## 2021-07-06 NOTE — Telephone Encounter (Signed)
Kathlen Mody, Cadence H, PA-C  ?Sent: Wed July 06, 2021  2:13 PM  ?To: Emily Filbert, RN  ? ? ?  ?   ? ?Message ? ?Does not need to continue magnesium.   ? ?

## 2021-07-06 NOTE — Telephone Encounter (Signed)
I spoke with the patient. ?She is aware of Cadence Furth, PA's recommendations that she may stop magnesium. ? ?The patient voices understanding and is agreeable. ? ?

## 2021-07-19 ENCOUNTER — Encounter: Payer: Self-pay | Admitting: Nurse Practitioner

## 2021-07-19 ENCOUNTER — Other Ambulatory Visit: Payer: Self-pay

## 2021-07-19 ENCOUNTER — Ambulatory Visit (INDEPENDENT_AMBULATORY_CARE_PROVIDER_SITE_OTHER): Payer: Medicare Other | Admitting: Nurse Practitioner

## 2021-07-19 VITALS — BP 110/70 | HR 84 | Ht 65.0 in | Wt 195.0 lb

## 2021-07-19 DIAGNOSIS — E782 Mixed hyperlipidemia: Secondary | ICD-10-CM

## 2021-07-19 DIAGNOSIS — I10 Essential (primary) hypertension: Secondary | ICD-10-CM

## 2021-07-19 DIAGNOSIS — R002 Palpitations: Secondary | ICD-10-CM | POA: Diagnosis not present

## 2021-07-19 DIAGNOSIS — D509 Iron deficiency anemia, unspecified: Secondary | ICD-10-CM

## 2021-07-19 MED ORDER — MAGNESIUM OXIDE 400 MG PO CAPS: 400.0000 mg | ORAL_CAPSULE | Freq: Every day | ORAL | 0 refills | Status: DC

## 2021-07-19 NOTE — Patient Instructions (Signed)
Medication Instructions:  ?RESUME Magnesium Oxide 400 mg once daily ? ?*If you need a refill on your cardiac medications before your next appointment, please call your pharmacy* ? ? ?Lab Work: ?None ? ?If you have labs (blood work) drawn today and your tests are completely normal, you will receive your results only by: ?MyChart Message (if you have MyChart) OR ?A paper copy in the mail ?If you have any lab test that is abnormal or we need to change your treatment, we will call you to review the results. ? ? ?Testing/Procedures: ?None ? ? ?Follow-Up: ?At [facility], you and your health needs are our priority.  As part of our continuing mission to provide you with exceptional heart care, we have created designated Provider Care Teams.  These Care Teams include your primary Cardiologist (physician) and Advanced Practice Providers (APPs -  Physician Assistants and Nurse Practitioners) who all work together to provide you with the care you need, when you need it. ? ? ?Your next appointment:   ?As needed.  ?

## 2021-07-19 NOTE — Progress Notes (Signed)
? ? ?Office Visit  ?  ?Patient Name: Stacy Cunningham ?Date of Encounter: 07/19/2021 ? ?Primary Care Provider:  [name], MD ?Primary Cardiologist:  Kate Sable, MD ? ?Chief Complaint  ?  ?71 year old female with a history of hypertension, hyperlipidemia, diabetes, anemia, GERD, and glaucoma, who presents for follow-up related to palpitations. ? ?Past Medical History  ?  ?Past Medical History:  ?Diagnosis Date  ? Anemia   ? Borderline diabetes mellitus   ? Elevated lipids   ? GERD (gastroesophageal reflux disease)   ? Glaucoma   ? Headache   ? h/o migraines  ? Hypertension   ? Neuropathy   ? Palpitations   ? a. 05/2020 Zio: Predominantly sinus rhythm, 80 (60-148).  1 SVT run-4 beats at 148.  Rare PACs and PVCs; b. 04/2021 Zio: Predominantly sinus rhythm, 80 (59-140).  Rare PACs/PVCs.  ? Panic attack   ? ?Past Surgical History:  ?Procedure Laterality Date  ? ANTERIOR CERVICAL DECOMP/DISCECTOMY FUSION N/A 09/30/2018  ? Procedure: ANTERIOR CERVICAL DECOMPRESSION/DISCECTOMY FUSION 1 LEVEL - C 5/6;  Surgeon: Deetta Perla, MD;  Location: [facility] ORS;  Service: Neurosurgery;  Laterality: N/A;  ? CHOLECYSTECTOMY N/A 02/06/2018  ? Procedure: LAPAROSCOPIC CHOLECYSTECTOMY;  Surgeon: Herbert Pun, MD;  Location: [facility] ORS;  Service: General;  Laterality: N/A;  ? COLONOSCOPY WITH PROPOFOL N/A 07/13/2017  ? Procedure: COLONOSCOPY WITH PROPOFOL;  Surgeon: Virgel Manifold, MD;  Location: [facility] ENDOSCOPY;  Service: Endoscopy;  Laterality: N/A;  ? ESOPHAGOGASTRODUODENOSCOPY (EGD) WITH PROPOFOL N/A 09/14/2017  ? Procedure: ESOPHAGOGASTRODUODENOSCOPY (EGD) WITH PROPOFOL;  Surgeon: Virgel Manifold, MD;  Location: [facility] ENDOSCOPY;  Service: Endoscopy;  Laterality: N/A;  ? ? ?Allergies ? ?Allergies  ?Allergen Reactions  ? Sulfa Antibiotics Dermatitis  ? Darvon [Propoxyphene] Hives  ? Other Other (See Comments) and Rash  ?  PAPER TAPE OK TO USE ?PAPER TAPE OK TO USE ?  ? Tape Rash  ?  PAPER TAPE OK TO USE   ? ? ?History of Present Illness  ?  ?71 year old female with above complex past medical history including hypertension, hyperlipidemia, diabetes, anemia, GERD, and glaucoma.  She was previously evaluated in February 2022, in the setting of palpitations with subsequent monitoring revealing predominantly sinus rhythm with 1 brief run of SVT (4 beats-maximum rate 148 bpm), and otherwise no significant arrhythmias, and she was conservatively managed.  More recently, she was seen in May 24, 2021 with ongoing complaints of intermittent palpitations.  Repeat monitoring roughly 4 days, again showed predominantly sinus rhythm with isolated and rare PACs and PVCs. ? ?Since her last visit, [name] has done well.  She is reasonably active without symptoms or limitations and has not been particularly bothered by palpitations.  She denies chest pain, dyspnea, PND, orthopnea, dizziness, syncope, edema, or early satiety. ? ?Home Medications  ?  ?Current Outpatient Medications  ?Medication Sig Dispense Refill  ? Accu-Chek Softclix Lancets lancets     ? acetaminophen (TYLENOL) 500 MG tablet Take 1,000 mg by mouth 2 (two) times daily as needed for moderate pain or headache.    ? aspirin 81 MG EC tablet Take 81 mg by mouth daily.    ? brimonidine (ALPHAGAN) 0.15 % ophthalmic solution Place 1 drop into both eyes 3 (three) times daily.    ? clotrimazole (LOTRIMIN) 1 % cream Apply 1 application topically daily as needed (rash).    ? COMBIGAN 0.2-0.5 % ophthalmic solution Apply 1 drop to eye 2 (two) times daily.    ? dorzolamide-timolol (COSOPT)  22.3-6.8 MG/ML ophthalmic solution Place 1 drop into both eyes at bedtime.    ? famotidine (PEPCID) 40 MG tablet Take 40 mg by mouth daily.    ? ferrous sulfate 325 (65 FE) MG tablet Take 325 mg by mouth 2 (two) times a day.     ? fluticasone (FLONASE) 50 MCG/ACT nasal spray Place 2 sprays into both nostrils daily as needed.    ? gabapentin (NEURONTIN) 800 MG tablet Take 800 mg by mouth 3  (three) times daily.    ? hydrochlorothiazide (HYDRODIURIL) 25 MG tablet Take 25 mg by mouth daily.    ? lidocaine (LIDODERM) 5 % Place 2 patches onto the skin daily as needed (pain). Remove & Discard patch within 12 hours or as directed by MD    ? lisinopril (ZESTRIL) 2.5 MG tablet Take 2.5 mg by mouth daily.    ? Magnesium Oxide 400 MG CAPS Take 1 capsule (400 mg total) by mouth daily.  0  ? metFORMIN (GLUCOPHAGE) 500 MG tablet Take 500-1,000 mg by mouth See admin instructions. Take 1000 mg in the morning and 500 mg in the evening    ? simvastatin (ZOCOR) 20 MG tablet Take 20 mg by mouth every evening.    ? timolol (BETIMOL) 0.5 % ophthalmic solution Place 1 drop into both eyes 2 (two) times daily.    ? ?No current facility-administered medications for this visit.  ?  ? ?Review of Systems  ?  ?She denies chest pain, palpitations, dyspnea, pnd, orthopnea, n, v, dizziness, syncope, edema, weight gain, or early satiety.  All other systems reviewed and are otherwise negative except as noted above. ?  ? ?Physical Exam  ?  ?VS:  BP 110/70 (BP Location: Left Arm, Patient Position: Sitting, Cuff Size: Large)   Pulse 84   Ht '5\' 5"'$  (1.651 m)   Wt 195 lb (88.5 kg)   SpO2 98%   BMI 32.45 kg/m?  , BMI Body mass index is 32.45 kg/m?. ?    ?GEN: Well nourished, well developed, in no acute distress. ?HEENT: normal. ?Neck: Supple, no JVD, carotid bruits, or masses. ?Cardiac: RRR, no murmurs, rubs, or gallops. No clubbing, cyanosis, edema.  Radials/PT 2+ and equal bilaterally.  ?Respiratory:  Respirations regular and unlabored, clear to auscultation bilaterally. ?GI: Soft, nontender, nondistended, BS + x 4. ?MS: no deformity or atrophy. ?Skin: warm and dry, no rash. ?Neuro:  Strength and sensation are intact. ?Psych: Normal affect. ? ?Accessory Clinical Findings  ?  ?Lab Results  ?Component Value Date  ? WBC 6.1 09/18/2018  ? HGB 10.5 (L) 09/18/2018  ? HCT 34.0 (L) 09/18/2018  ? MCV 73.1 (L) 09/18/2018  ? PLT 238 09/18/2018   ? ?Lab Results  ?Component Value Date  ? CREATININE 0.68 09/18/2018  ? BUN 14 09/18/2018  ? NA 139 09/18/2018  ? K 3.8 09/18/2018  ? CL 102 09/18/2018  ? CO2 28 09/18/2018  ? ?Lab Results  ?Component Value Date  ? ALT 38 06/05/2018  ? AST 70 (H) 06/05/2018  ? ALKPHOS 57 06/05/2018  ? BILITOT 0.5 06/05/2018  ? ? ?Assessment & Plan  ?  ?1.  Palpitations: Patient previously evaluated in early 2022 in the setting of palpitations with monitoring showing 1 brief run of SVT-4 beats at 148 bpm.  She was recently seen in January with ongoing complaints of intermittent fluttering type palpitations.  She wore a ZIO monitor for approximately 5 days and this just showed rare PACs and PVCs (less than 1%).  She has not had any significant symptoms since her last visit.  I offered reassurance with regards to the finding of her monitoring and will continue conservative management. ? ?2.  Essential hypertension: Well-controlled on current regimen which includes HCTZ and ACE inhibitor therapy. ? ?3.  Hyperlipidemia: On statin therapy and followed by primary care. ? ?4.  Hypomagnesemia: Patient had a mag level in January which was low at 1.5.  Follow-up level this month was on the low end of normal at 1.7.  I encouraged her to continue taking Mag-Ox 400 mg daily. ? ?5.  Iron deficiency anemia: Followed by primary care.  She says she was recently told to stop taking her iron supplement however, she notes more energy when she takes it.  As result, she continues to take it and I encouraged her to discuss this with her primary care provider. ? ?6.  Disposition: Follow-up as needed. ? ? ?Murray Hodgkins, NP ?07/19/2021, 9:47 AM ? ?

## 2021-08-25 ENCOUNTER — Encounter: Payer: Medicare Other | Admitting: Student in an Organized Health Care Education/Training Program

## 2021-11-01 ENCOUNTER — Other Ambulatory Visit: Payer: Self-pay | Admitting: Family Medicine

## 2021-11-01 ENCOUNTER — Other Ambulatory Visit: Payer: Self-pay | Admitting: Podiatry

## 2021-11-01 DIAGNOSIS — Z1231 Encounter for screening mammogram for malignant neoplasm of breast: Secondary | ICD-10-CM

## 2021-11-06 IMAGING — US US PELVIS COMPLETE WITH TRANSVAGINAL
1 series · 15 of 25 positions shown · non-contrast
Comparison: None

CLINICAL DATA: Uterine leiomyoma of unspecified location,
postmenopausal



[Series 1: us pelvis complete · 15 of 70 slices shown]
[im 1/70]
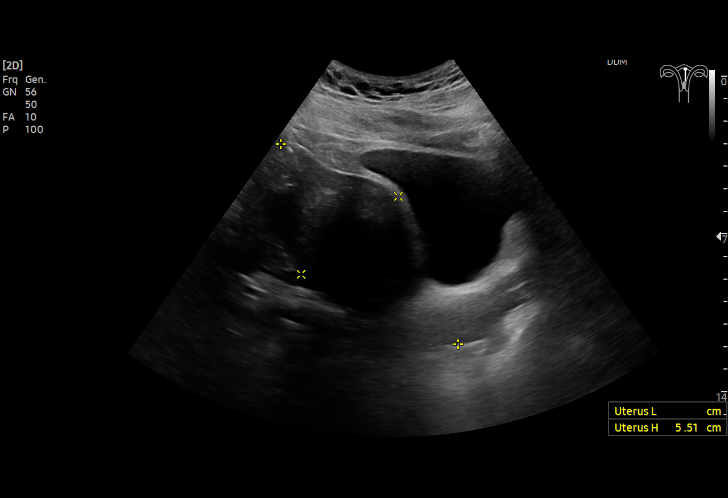
[im 6/70]
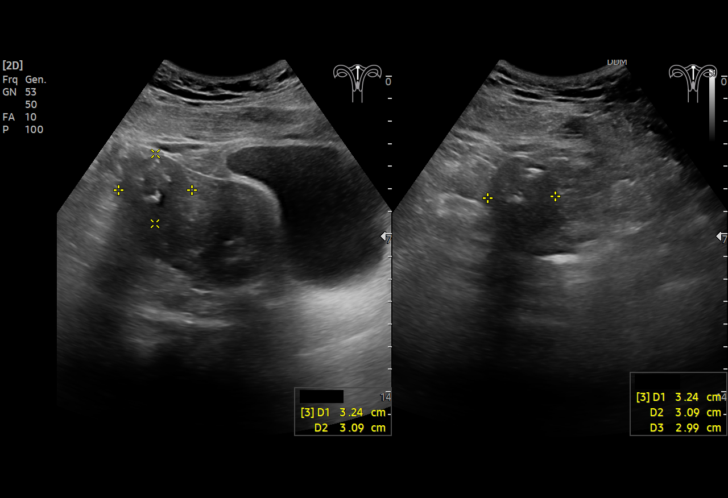
[im 12/70]
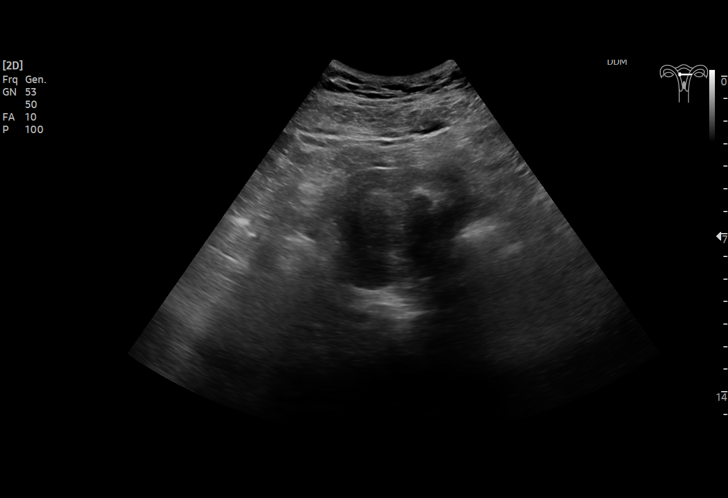
[im 15/70]
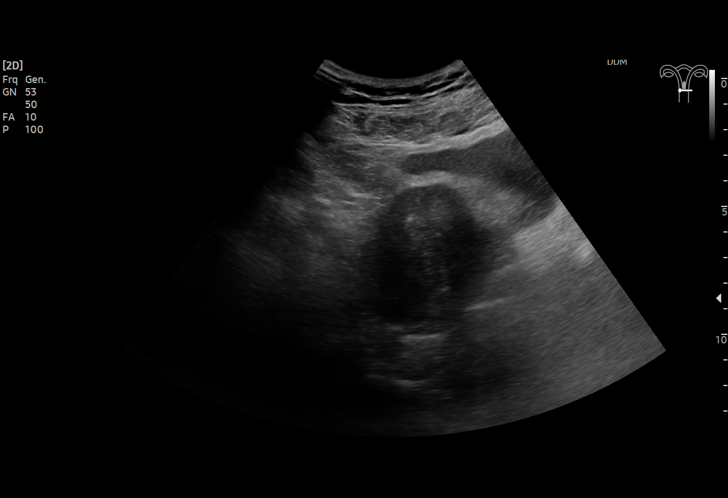
[im 21/70]
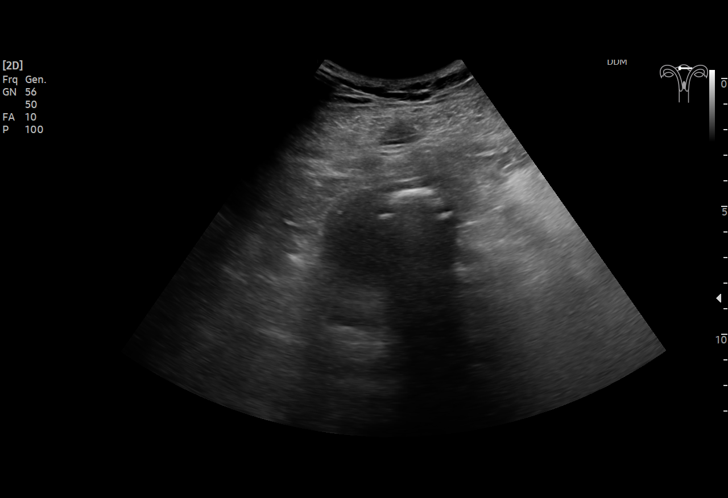
[im 26/70]
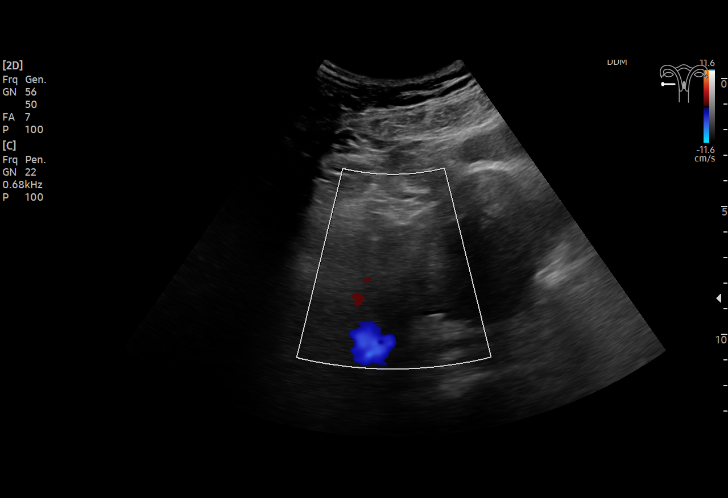
[im 29/70]
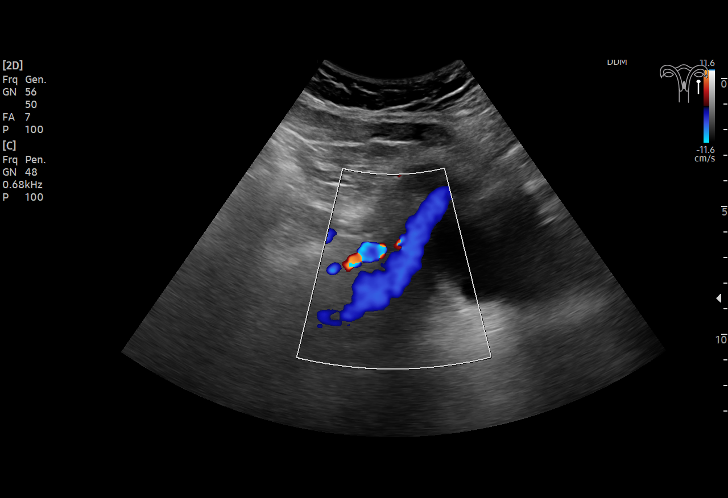
[im 35/70]
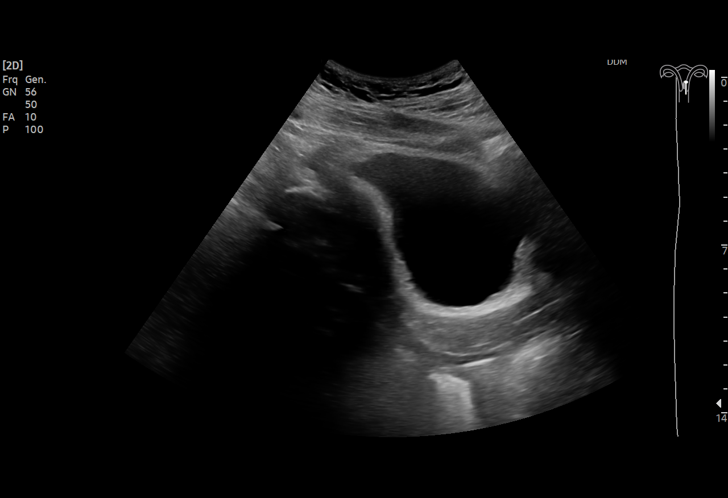
[im 41/70]
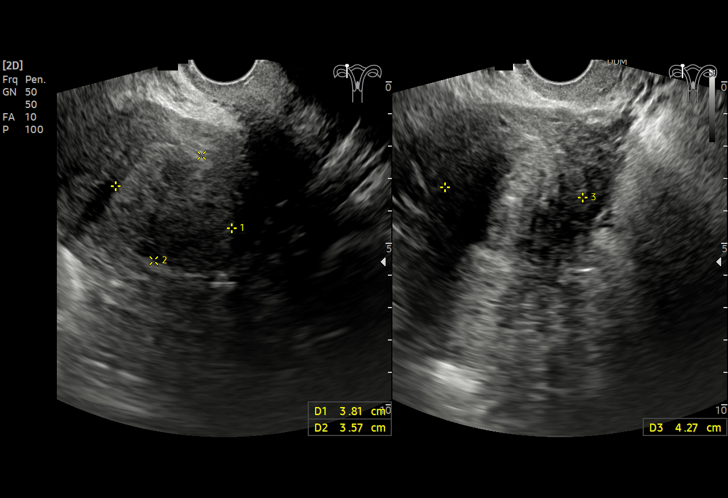
[im 44/70]
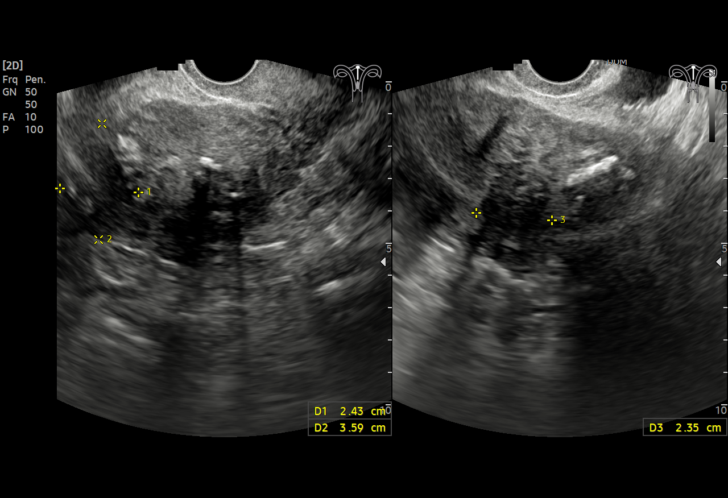
[im 49/70]
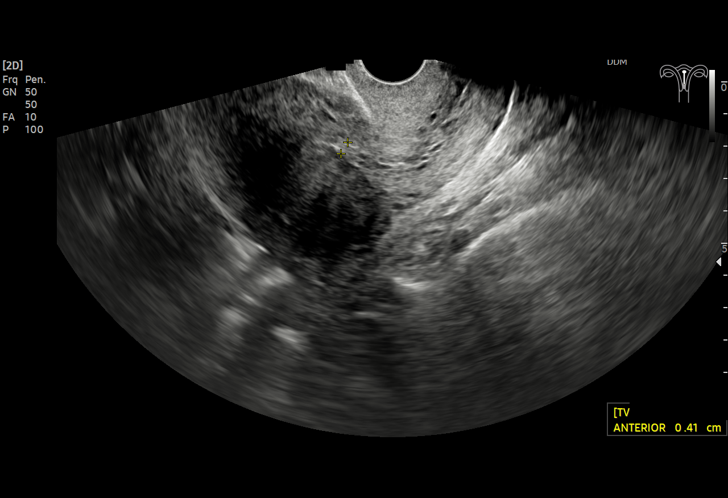
[im 55/70]
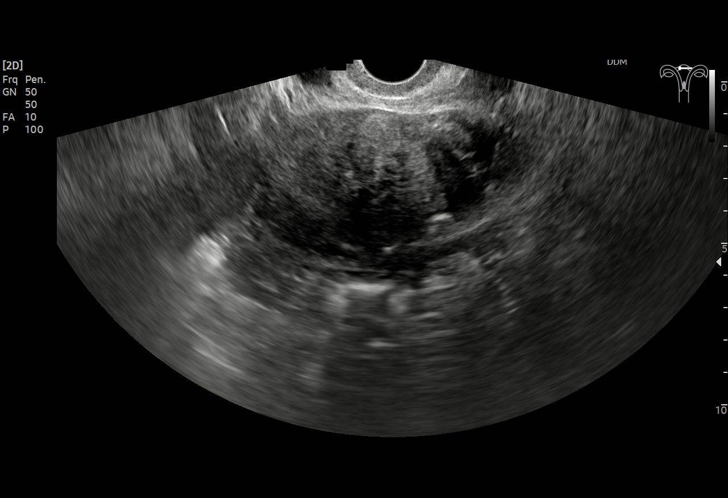
[im 58/70]
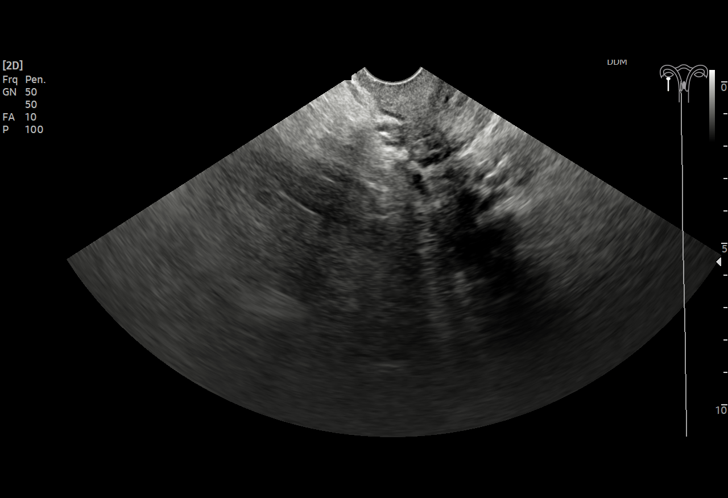
[im 64/70]
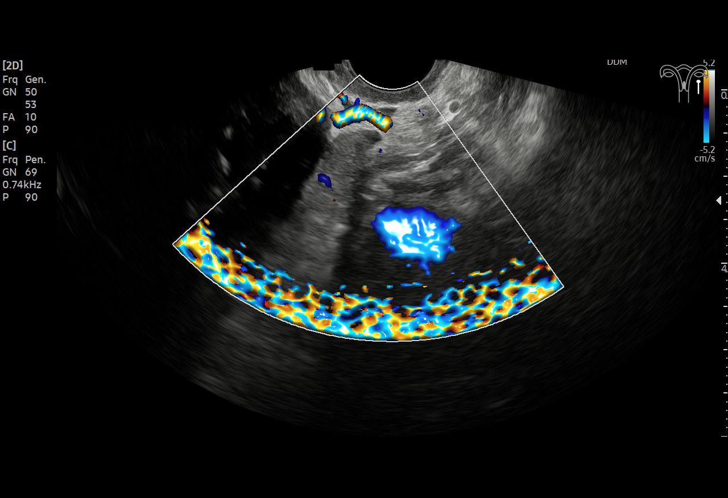
[im 70/70]
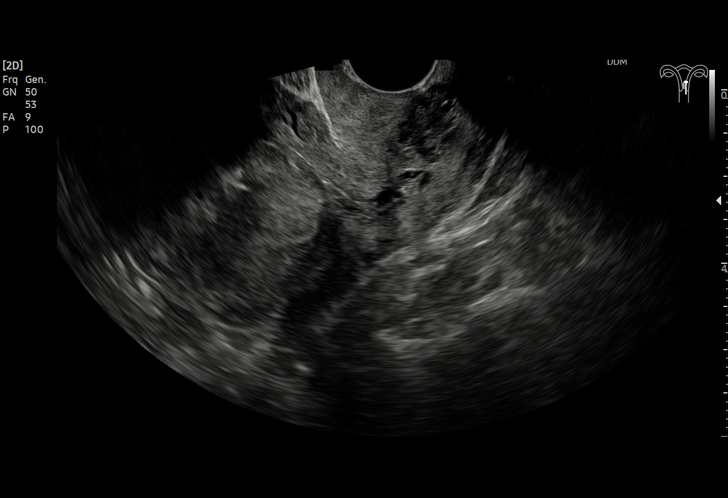

[15 of 25 positions shown; findings below may reference images not displayed]

FINDINGS: Uterus

Measurements: 11.8 x 5.5 x 6.2 cm = volume: 213 mL. Anteverted.
Heterogeneous myometrium. Several masses are seen consistent with
leiomyomata. These include a transmural posterior RIGHT upper
leiomyoma 5.0 cm diameter, small intramural leiomyoma at fundus
cm diameter, exophytic fundal leiomyoma 3.2 cm diameter, and an
exophytic LEFT upper uterine leiomyoma 3.4 cm diameter.

Endometrium

Thickness: 4 mm.  No endometrial fluid or mass

Right ovary

Not visualized, likely obscured by bowel

Left ovary

Not visualized, likely obscured by bowel

Other findings

No free pelvic fluid.  No adnexal masses.
IMPRESSION: Enlarged uterus containing multiple uterine leiomyomata, largest a
5.0 cm transmural leiomyoma at posterior upper RIGHT uterus.

Unremarkable endometrial complex.

Nonvisualization of ovaries.

## 2021-11-30 ENCOUNTER — Ambulatory Visit
Admission: RE | Admit: 2021-11-30 | Discharge: 2021-11-30 | Disposition: A | Discharge status: Home / Self Care | Payer: Medicare Other | Source: Ambulatory Visit | Attending: Family Medicine | Admitting: Family Medicine

## 2021-11-30 DIAGNOSIS — Z1231 Encounter for screening mammogram for malignant neoplasm of breast: Secondary | ICD-10-CM | POA: Diagnosis not present

## 2022-02-07 ENCOUNTER — Encounter: Payer: Self-pay | Admitting: Ophthalmology

## 2022-02-14 NOTE — Discharge Instructions (Signed)

## 2022-02-15 ENCOUNTER — Other Ambulatory Visit: Payer: Self-pay

## 2022-02-15 ENCOUNTER — Ambulatory Visit
Admission: RE | Admit: 2022-02-15 | Discharge: 2022-02-15 | Disposition: A | Discharge status: Home / Self Care | Payer: Medicare Other | Attending: Ophthalmology | Admitting: Ophthalmology

## 2022-02-15 ENCOUNTER — Encounter: Payer: Self-pay | Admitting: Ophthalmology

## 2022-02-15 ENCOUNTER — Ambulatory Visit: Payer: Medicare Other | Admitting: General Practice

## 2022-02-15 ENCOUNTER — Encounter
Admission: RE | Disposition: A | Discharge status: Home / Self Care | Payer: Self-pay | Source: Home / Self Care | Attending: Ophthalmology

## 2022-02-15 DIAGNOSIS — Z8249 Family history of ischemic heart disease and other diseases of the circulatory system: Secondary | ICD-10-CM | POA: Insufficient documentation

## 2022-02-15 DIAGNOSIS — Z7984 Long term (current) use of oral hypoglycemic drugs: Secondary | ICD-10-CM | POA: Diagnosis not present

## 2022-02-15 DIAGNOSIS — I10 Essential (primary) hypertension: Secondary | ICD-10-CM | POA: Diagnosis not present

## 2022-02-15 DIAGNOSIS — H2511 Age-related nuclear cataract, right eye: Secondary | ICD-10-CM | POA: Insufficient documentation

## 2022-02-15 DIAGNOSIS — Z79899 Other long term (current) drug therapy: Secondary | ICD-10-CM | POA: Diagnosis not present

## 2022-02-15 DIAGNOSIS — D649 Anemia, unspecified: Secondary | ICD-10-CM | POA: Diagnosis not present

## 2022-02-15 DIAGNOSIS — Z8349 Family history of other endocrine, nutritional and metabolic diseases: Secondary | ICD-10-CM | POA: Diagnosis not present

## 2022-02-15 DIAGNOSIS — H401110 Primary open-angle glaucoma, right eye, stage unspecified: Secondary | ICD-10-CM | POA: Insufficient documentation

## 2022-02-15 DIAGNOSIS — E1136 Type 2 diabetes mellitus with diabetic cataract: Secondary | ICD-10-CM | POA: Diagnosis present

## 2022-02-15 DIAGNOSIS — K219 Gastro-esophageal reflux disease without esophagitis: Secondary | ICD-10-CM | POA: Diagnosis not present

## 2022-02-15 DIAGNOSIS — Z8619 Personal history of other infectious and parasitic diseases: Secondary | ICD-10-CM | POA: Diagnosis not present

## 2022-02-15 DIAGNOSIS — E1140 Type 2 diabetes mellitus with diabetic neuropathy, unspecified: Secondary | ICD-10-CM | POA: Insufficient documentation

## 2022-02-15 HISTORY — PX: CATARACT EXTRACTION W/PHACO: SHX586

## 2022-02-15 LAB — GLUCOSE, CAPILLARY
Glucose-Capillary: 136 mg/dL — ABNORMAL HIGH (ref 70–99)
Glucose-Capillary: 138 mg/dL — ABNORMAL HIGH (ref 70–99)

## 2022-02-15 SURGERY — PHACOEMULSIFICATION, CATARACT, WITH IOL INSERTION
Anesthesia: Monitor Anesthesia Care | Site: Eye | Laterality: Right

## 2022-02-15 MED ORDER — SIGHTPATH DOSE#1 NA HYALUR & NA CHOND-NA HYALUR IO KIT
PACK | INTRAOCULAR | Status: DC | PRN
  Administered 2022-02-15: 1 via OPHTHALMIC

## 2022-02-15 MED ORDER — CEFUROXIME OPHTHALMIC INJECTION 1 MG/0.1 ML
INJECTION | OPHTHALMIC | Status: DC | PRN
  Administered 2022-02-15: 0.1 mL via INTRACAMERAL

## 2022-02-15 MED ORDER — NA CHONDROIT SULF-NA HYALURON 40-30 MG/ML IO SOSY
INTRAOCULAR | Status: DC | PRN
  Administered 2022-02-15: 0.5 mL via INTRAOCULAR

## 2022-02-15 MED ORDER — FENTANYL CITRATE (PF) 100 MCG/2ML IJ SOLN
INTRAMUSCULAR | Status: DC | PRN
  Administered 2022-02-15 (×2): 50 ug via INTRAVENOUS

## 2022-02-15 MED ORDER — SIGHTPATH DOSE#1 BSS IO SOLN
INTRAOCULAR | Status: DC | PRN
  Administered 2022-02-15: 1 mL via INTRAMUSCULAR

## 2022-02-15 MED ORDER — MIDAZOLAM HCL 2 MG/2ML IJ SOLN
INTRAMUSCULAR | Status: DC | PRN
  Administered 2022-02-15: 2 mg via INTRAVENOUS

## 2022-02-15 MED ORDER — SIGHTPATH DOSE#1 BSS IO SOLN
INTRAOCULAR | Status: DC | PRN
  Administered 2022-02-15: 64 mL via OPHTHALMIC

## 2022-02-15 MED ORDER — TETRACAINE HCL 0.5 % OP SOLN
1.0000 [drp] | OPHTHALMIC | Status: DC | PRN
  Administered 2022-02-15 (×3): 1 [drp] via OPHTHALMIC

## 2022-02-15 MED ORDER — SIGHTPATH DOSE#1 BSS IO SOLN
INTRAOCULAR | Status: DC | PRN
  Administered 2022-02-15: 15 mL

## 2022-02-15 MED ORDER — ARMC OPHTHALMIC DILATING DROPS
1.0000 | OPHTHALMIC | Status: DC | PRN
  Administered 2022-02-15 (×3): 1 via OPHTHALMIC

## 2022-02-15 MED ORDER — LACTATED RINGERS IV SOLN: INTRAVENOUS | Status: DC

## 2022-02-15 SURGICAL SUPPLY — 12 items
BLADE DUAL KAHOOK SINGLE USE (BLADE) IMPLANT
CATARACT SUITE SIGHTPATH (MISCELLANEOUS) ×1 IMPLANT
FEE CATARACT SUITE SIGHTPATH (MISCELLANEOUS) ×1 IMPLANT
GLOVE SRG 8 PF TXTR STRL LF DI (GLOVE) ×1 IMPLANT
GLOVE SURG ENC TEXT LTX SZ7.5 (GLOVE) ×1 IMPLANT
GLOVE SURG UNDER POLY LF SZ8 (GLOVE) ×1
ICLIP (OPHTHALMIC RELATED) IMPLANT
LENS IOL TECNIS EYHANCE 21.5 (Intraocular Lens) IMPLANT
NDL FILTER BLUNT 18X1 1/2 (NEEDLE) ×1 IMPLANT
NEEDLE FILTER BLUNT 18X1 1/2 (NEEDLE) ×1 IMPLANT
SYR 3ML LL SCALE MARK (SYRINGE) ×1 IMPLANT
WATER STERILE IRR 250ML POUR (IV SOLUTION) ×1 IMPLANT

## 2022-02-15 NOTE — Anesthesia Postprocedure Evaluation (Signed)
Anesthesia Post Note  Patient: Stacy Cunningham  Procedure(s) Performed: CATARACT EXTRACTION PHACO AND INTRAOCULAR LENS PLACEMENT (IOC) RIGHT [place] DUAL BLADE GONIOTOMY DIABETIC 6.21 01:05.9 (Right: Eye)  Patient location during evaluation: PACU Anesthesia Type: MAC Level of consciousness: awake and alert Pain management: pain level controlled Vital Signs Assessment: post-procedure vital signs reviewed and stable Respiratory status: spontaneous breathing, nonlabored ventilation, respiratory function stable and patient connected to nasal cannula oxygen Cardiovascular status: stable and blood pressure returned to baseline Postop Assessment: no apparent nausea or vomiting Anesthetic complications: no   No notable events documented.   Last Vitals:  Vitals:   02/15/22 0830 02/15/22 0836  BP: 105/75 110/72  Pulse: 79 81  Resp: 13 14  Temp: (!) 36.4 C (!) 36.4 C  SpO2: 100% 94%    Last Pain:  Vitals:   02/15/22 0836  TempSrc:   PainSc: 0-No pain                 Martha Clan

## 2022-02-15 NOTE — Transfer of Care (Signed)
Immediate Anesthesia Transfer of Care Note  Patient: Stacy Cunningham  Procedure(s) Performed: CATARACT EXTRACTION PHACO AND INTRAOCULAR LENS PLACEMENT (IOC) RIGHT [place] DUAL BLADE GONIOTOMY DIABETIC 6.21 01:05.9 (Right: Eye)  Patient Location: PACU  Anesthesia Type: MAC  Level of Consciousness: awake, alert  and patient cooperative  Airway and Oxygen Therapy: Patient Spontanous Breathing and Patient connected to supplemental oxygen  Post-op Assessment: Post-op Vital signs reviewed, Patient's Cardiovascular Status Stable, Respiratory Function Stable, Patent Airway and No signs of Nausea or vomiting  Post-op Vital Signs: Reviewed and stable  Complications: No notable events documented.

## 2022-02-15 NOTE — H&P (Signed)
[facility]   Primary Care Physician:  Alene Mires Elyse Jarvis, MD Ophthalmologist: Dr. Leandrew Koyanagi  Pre-Procedure History & Physical: HPI:  Stacy Cunningham is a 71 y.o. female here for ophthalmic surgery.   Past Medical History:  Diagnosis Date   Anemia    Borderline diabetes mellitus    Elevated lipids    GERD (gastroesophageal reflux disease)    Glaucoma    Headache    h/o migraines   Hypertension    Neuropathy    Palpitations    a. 05/2020 Zio: Predominantly sinus rhythm, 80 (60-148).  1 SVT run-4 beats at 148.  Rare PACs and PVCs; b. 04/2021 Zio: Predominantly sinus rhythm, 80 (59-140).  Rare PACs/PVCs.   Panic attack     Past Surgical History:  Procedure Laterality Date   ANTERIOR CERVICAL DECOMP/DISCECTOMY FUSION N/A 09/30/2018   Procedure: ANTERIOR CERVICAL DECOMPRESSION/DISCECTOMY FUSION 1 LEVEL - C 5/6;  Surgeon: Deetta Perla, MD;  Location: [facility] ORS;  Service: Neurosurgery;  Laterality: N/A;   CHOLECYSTECTOMY N/A 02/06/2018   Procedure: LAPAROSCOPIC CHOLECYSTECTOMY;  Surgeon: Herbert Pun, MD;  Location: [facility] ORS;  Service: General;  Laterality: N/A;   COLONOSCOPY WITH PROPOFOL N/A 07/13/2017   Procedure: COLONOSCOPY WITH PROPOFOL;  Surgeon: Virgel Manifold, MD;  Location: [facility] END[facility];  Service: Endoscopy;  Laterality: N/A;   ESOPHAGOGASTRODUODENOSCOPY (EGD) WITH PROPOFOL N/A 09/14/2017   Procedure: ESOPHAGOGASTRODUODENOSCOPY (EGD) WITH PROPOFOL;  Surgeon: Virgel Manifold, MD;  Location: [facility] END[facility];  Service: Endoscopy;  Laterality: N/A;    Prior to Admission medications   Medication Sig Start Date End Date Taking? Authorizing Provider  acetaminophen (TYLENOL) 500 MG tablet Take 1,000 mg by mouth 2 (two) times daily as needed for moderate pain or headache.   Yes [provider]  aspirin 81 MG EC tablet Take 81 mg by mouth daily.   Yes [provider]  brimonidine (ALPHAGAN) 0.15 % ophthalmic solution Place 1 drop  into both eyes 3 (three) times daily. 05/06/20  Yes [provider]  clotrimazole (LOTRIMIN) 1 % cream Apply 1 application topically daily as needed (rash).   Yes [provider]  COMBIGAN 0.2-0.5 % ophthalmic solution Apply 1 drop to eye 2 (two) times daily. 12/15/19  Yes [provider]  dorzolamide-timolol (COSOPT) 22.3-6.8 MG/ML ophthalmic solution Place 1 drop into both eyes at bedtime. 09/07/20  Yes [provider]  famotidine (PEPCID) 40 MG tablet Take 40 mg by mouth daily. 05/20/20  Yes [provider]  ferrous sulfate 325 (65 FE) MG tablet Take 325 mg by mouth 2 (two) times a day.    Yes [provider]  fluticasone (FLONASE) 50 MCG/ACT nasal spray Place 2 sprays into both nostrils daily as needed. 09/06/20  Yes [provider]  gabapentin (NEURONTIN) 800 MG tablet Take 800 mg by mouth 3 (three) times daily.   Yes [provider]  hydrochlorothiazide (HYDRODIURIL) 25 MG tablet Take 25 mg by mouth daily.   Yes [provider]  lidocaine (LIDODERM) 5 % Place 2 patches onto the skin daily as needed (pain). Remove & Discard patch within 12 hours or as directed by MD   Yes [provider]  lisinopril (ZESTRIL) 2.5 MG tablet Take 2.5 mg by mouth daily. 08/03/20  Yes [provider]  Magnesium Oxide 400 MG CAPS Take 1 capsule (400 mg total) by mouth daily. 07/19/21  Yes Theora Gianotti, NP  metFORMIN (GLUCOPHAGE) 500 MG tablet Take 500-1,000 mg by mouth See admin instructions. Take 1000  mg in the morning and 500 mg in the evening 10/21/07  Yes [provider]  simvastatin (ZOCOR) 20 MG tablet Take 20 mg by mouth every evening.   Yes [provider]  timolol (BETIMOL) 0.5 % ophthalmic solution Place 1 drop into both eyes 2 (two) times daily.   Yes [provider]  Accu-Chek Softclix Lancets lancets  12/15/19   [provider]    Allergies as of 01/02/2022 - Review  Complete 07/19/2021  Allergen Reaction Noted   Sulfa antibiotics Dermatitis 07/27/2016   Darvon [propoxyphene] Hives 06/17/2014   Other Other (See Comments) and Rash 02/01/2018   Tape Rash 02/01/2018    Family History  Problem Relation Age of Onset   Breast cancer Sister 83   Heart attack Mother 19   Hyperlipidemia Mother    Hypertension Mother     Social History   Socioeconomic History   Marital status: Married    Spouse name: Not on file   Number of children: Not on file   Years of education: Not on file   Highest education level: Not on file  Occupational History   Not on file  Tobacco Use   Smoking status: Never   Smokeless tobacco: Never  Vaping Use   Vaping Use: Never used  Substance and Sexual Activity   Alcohol use: Not Currently   Drug use: No   Sexual activity: Yes  Other Topics Concern   Not on file  Social History Narrative   Not on file   Social Determinants of Health   Financial Resource Strain: Not on file  Food Insecurity: Not on file  Transportation Needs: Not on file  Physical Activity: Not on file  Stress: Not on file  Social Connections: Not on file  Intimate Partner Violence: Not on file    Review of Systems: See HPI, otherwise negative ROS  Physical Exam: BP 139/71   Pulse 85   Temp (!) 97 F (36.1 C) (Temporal)   Resp 18   Ht '5\' 5"'$  (1.651 m)   Wt 88.5 kg   SpO2 95%   BMI 32.45 kg/m  General:   Alert,  pleasant and cooperative in NAD Head:  Normocephalic and atraumatic. Lungs:  Clear to auscultation.    Heart:  Regular rate and rhythm.   Impression/Plan: Stacy Cunningham is here for ophthalmic surgery.  Risks, benefits, limitations, and alternatives regarding ophthalmic surgery have been reviewed with the patient.  Questions have been answered.  All parties agreeable.   Leandrew Koyanagi, MD  02/15/2022, 7:32 AM

## 2022-02-15 NOTE — Op Note (Signed)
  PREOPERATIVE DIAGNOSIS:  Nuclear sclerotic cataract  right eye. 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 Item Serial No. Manufacturer Lot No. LRB No. Used Action  LENS IOL TECNIS EYHANCE 21.5 - [id number] Intraocular Lens LENS IOL TECNIS EYHANCE 21.5 [id number] [employer]  Right 1 Implanted    SURGEON:  Wyonia Hough, MD   ANESTHESIA:  Topical with tetracaine drops augmented with 1% preservative-free intracameral lidocaine.    COMPLICATIONS:  None.   DESCRIPTION OF PROCEDURE:  The patient was identified in the holding room and transported to the operating room and placed in the supine position under the operating microscope.  The right eye was identified as the operative eye and it was prepped and draped in the usual sterile ophthalmic fashion.   A 1 millimeter clear-corneal paracentesis was made at the 12:00 position.  0.5 ml of preservative-free 1% lidocaine was injected into the anterior chamber.  The anterior chamber was filled with Viscoat viscoelastic.  A 2.4 millimeter keratome was used to make a near-clear corneal incision at the 9:00 position. The microscope was adjusted and a gonioprism was used to visulaize the trabecular meshwork.  [facility] Dual Blade was advanced across the anterior chamber under viscoelastic.  The blade was used to mark the trabecular meshwork at the 1:30 position.  The blade was placed two clock hours clockwise into the meshwork.  Proper postioning was confirmed.  The blade ws  passed counterclockwise through the meshwork to excise approximately two to three clock-hours of trabecular meshwork.   A curvilinear capsulorrhexis was made with a cystotome and capsulorrhexis forceps.  Balanced salt solution was used to hydrodissect and hydrodelineate the nucleus.   Phacoemulsification was then used in stop and chop fashion to remove the lens nucleus and epinucleus.  The remaining cortex was then removed using the irrigation and aspiration handpiece. Provisc was then placed into the capsular bag to distend it for lens placement.  A lens was then injected into the capsular bag.  The remaining viscoelastic was aspirated.   Wounds were hydrated with balanced salt solution.  The anterior chamber was inflated to a physiologic pressure with balanced salt solution.  No wound leaks were noted. Cefuroxime 0.1 ml of a '10mg'$ /ml solution was injected into the anterior chamber for a dose of 1 mg of intracameral antibiotic at the completion of the case.  The patient was taken to the recovery room in stable condition without complications of anesthesia or surgery.

## 2022-02-15 NOTE — Anesthesia Preprocedure Evaluation (Signed)
Anesthesia Evaluation  Patient identified by MRN, date of birth, ID band Patient awake    Reviewed: Allergy & Precautions, H&P , NPO status , Patient's Chart, lab work & pertinent test results  History of Anesthesia Complications Negative for: history of anesthetic complications  Airway Mallampati: III  TM Distance: >3 FB Neck ROM: limited   Comment: Pain with neck extension Dental  (+) Missing, Partial Upper   Pulmonary neg pulmonary ROS,           Cardiovascular hypertension, On Medications (-) angina(-) CAD and (-) Past MI (-) dysrhythmias (-) Valvular Problems/Murmurs     Neuro/Psych  Headaches, neg Seizures PSYCHIATRIC DISORDERS Anxiety Cervical neuropathy    GI/Hepatic GERD  Controlled,(+) Hepatitis -, C  Endo/Other  diabetes  Renal/GU      Musculoskeletal   Abdominal   Peds  Hematology  (+) Blood dyscrasia, anemia ,   Anesthesia Other Findings Past Medical History: No date: Anemia No date: Diabetes mellitus without complication (HCC)     Comment:  Borderline Diabetes No date: Elevated lipids No date: GERD (gastroesophageal reflux disease) No date: Glaucoma No date: Headache     Comment:  h/o migraines No date: Hypertension No date: Neuropathy No date: Neuropathy No date: Neuropathy No date: Panic attack  Past Surgical History: 02/06/2018: CHOLECYSTECTOMY; N/A     Comment:  Procedure: LAPAROSCOPIC CHOLECYSTECTOMY;  Surgeon:               Herbert Pun, MD;  Location: [facility];  Service:              General;  Laterality: N/A; 07/13/2017: COLONOSCOPY WITH PROPOFOL; N/A     Comment:  Procedure: COLONOSCOPY WITH PROPOFOL;  Surgeon:               Virgel Manifold, MD;  Location: [facility];                Service: Endoscopy;  Laterality: N/A; 09/14/2017: ESOPHAGOGASTRODUODENOSCOPY (EGD) WITH PROPOFOL; N/A     Comment:  Procedure: ESOPHAGOGASTRODUODENOSCOPY (EGD) WITH                PROPOFOL;  Surgeon: Virgel Manifold, MD;  Location:               [facility];  Service: Endoscopy;  Laterality: N/A;  BMI    Body Mass Index:  30.18 kg/m      Reproductive/Obstetrics negative OB ROS                             Anesthesia Physical  Anesthesia Plan  ASA: 2  Anesthesia Plan: MAC   Post-op Pain Management:    Induction: Intravenous  PONV Risk Score and Plan: Midazolam and Treatment may vary due to age or medical condition  Airway Management Planned: Natural Airway and Nasal Cannula  Additional Equipment:   Intra-op Plan:   Post-operative Plan:   Informed Consent: I have reviewed the patients History and Physical, chart, labs and discussed the procedure including the risks, benefits and alternatives for the proposed anesthesia with the patient or authorized representative who has indicated his/her understanding and acceptance.     Dental Advisory Given  Plan Discussed with: Anesthesiologist and CRNA  Anesthesia Plan Comments:         Anesthesia Quick Evaluation

## 2022-02-16 ENCOUNTER — Encounter: Payer: Self-pay | Admitting: Ophthalmology

## 2022-02-27 ENCOUNTER — Ambulatory Visit: Payer: Medicare Other | Admitting: Family Medicine

## 2022-04-19 ENCOUNTER — Other Ambulatory Visit: Payer: Self-pay | Admitting: Otolaryngology

## 2022-04-19 DIAGNOSIS — E041 Nontoxic single thyroid nodule: Secondary | ICD-10-CM

## 2022-04-20 ENCOUNTER — Ambulatory Visit
Admission: EM | Admit: 2022-04-20 | Discharge: 2022-04-20 | Disposition: A | Discharge status: Home / Self Care | Payer: Medicare Other | Attending: Physician Assistant | Admitting: Physician Assistant

## 2022-04-20 DIAGNOSIS — G43911 Migraine, unspecified, intractable, with status migrainosus: Secondary | ICD-10-CM

## 2022-04-20 MED ORDER — KETOROLAC TROMETHAMINE 10 MG PO TABS: 10.0000 mg | ORAL_TABLET | Freq: Four times a day (QID) | ORAL | 0 refills | Status: AC | PRN

## 2022-04-20 MED ORDER — PROMETHAZINE HCL 25 MG PO TABS: ORAL_TABLET | ORAL | 0 refills | Status: DC

## 2022-04-20 MED ORDER — KETOROLAC TROMETHAMINE 60 MG/2ML IM SOLN
30.0000 mg | Freq: Once | INTRAMUSCULAR | Status: AC
  Administered 2022-04-20: 30 mg via INTRAMUSCULAR

## 2022-04-20 NOTE — ED Triage Notes (Signed)
Chief Complaint: headache. Slight blurred vision and light sensitivity. Patient has history of migraines. No injuries. Pain in the left side if laying on it.   Onset: 4 days   Prescriptions or OTC medications tried: Yes- propranolol, tylenol 500, toradol, valium     with no relief  Sick exposure: No  New foods, medications, or products: No  Recent Travel: No

## 2022-04-20 NOTE — Discharge Instructions (Addendum)
HEADACHE: You were seen in clinic today for headache. Rest and take meds as directed. If at any point, the headache becomes very severe, is associated with fever, is associated with neck pain/stiffness, you feel like passing out, the headache is different from any you've have had before, there are vision changes/issues with speech/issues with balance, or numbness/weakness in a part of the body, you should be seen urgently or emergently for more serious causes of headache   We discussed that when migraine headaches go more than 72 hours without resolving they are more difficult to treat and sometimes feel need to go to the emergency department for treatment.  We have given you an injection of ketorolac/Toradol in the clinic.  I hope that helps you.  I sent more this medication for you to use at home as needed as well as promethazine.  If your headache worsens or you have any red flag signs or symptoms listed above he should go to the ER sooner for evaluation and consideration of CT scan.  As we discussed, we do not have CT in urgent care.  I did place a referral to neurology for you but it is in [place].  If you need something closer you should reach back out to [place] clinic.

## 2022-04-20 NOTE — ED Provider Notes (Signed)
[facility]    CSN: [id number] Arrival date & time: 04/20/22  0949      History   Chief Complaint Chief Complaint  Patient presents with   Headache    HPI Stacy Cunningham is a 71 y.o. female presenting for chronic headaches for the past couple of years.  Patient reports over the past 4 days she has had a migraine that goes away very only temporarily and then returns.  She has had a migraine every day.  She says sometimes on the left side and sometimes on the right side.  She says it is throbbing and aching.  She reports some dizziness at times, nausea without vomiting, floaters and flashes of light but reports she has a history of glaucoma and did have to have eye surgery previously.  She has taken multiple things for migraines.  Has taken propranolol, Tylenol, oral Toradol and Valium.  She says nothing has really kept the migraine from returning.  She has not had any facial drooping or numbness.  Reports numbness and tingling of lower extremities but states she has a back problem.  It is no worse than normal.  No thunderclap headache.  No chest pain or shortness of breath or palpitations.  No vomiting.  No confusion.  Patient reports she cannot see her PCP until January regarding the migraines.  She says she is never had imaging of her head before and she thinks she might need imaging.  She says these headaches/migraines are new as of the past couple of years.  No other concerns today.  HPI  Past Medical History:  Diagnosis Date   Anemia    Borderline diabetes mellitus    Elevated lipids    GERD (gastroesophageal reflux disease)    Glaucoma    Headache    h/o migraines   Hypertension    Neuropathy    Palpitations    a. 05/2020 [facility]io: Predominantly sinus rhythm, 80 (60-148).  1 SVT run-4 beats at 148.  Rare PACs and PVCs; b. 04/2021 [facility]io: Predominantly sinus rhythm, 80 (59-140).  Rare PACs/PVCs.   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/21/2020   Lumbar radiculopathy 11/24/2020   Chronic pain syndrome 11/24/2020   Peeling skin 10/13/2019   Tinea pedis 10/13/2019   Numbness and tingling of both legs 09/02/2019   Benign essential hypertension 01/07/2019   DM type 2 with diabetic mixed hyperlipidemia ([place]) 01/07/2019   Hyperlipidemia, mixed 01/07/2019   Chronic radicular lumbar pain 09/30/2018   Stomach irritation    Abdominal pain, epigastric    Special screening for malignant neoplasms, colon    Diverticulosis of large intestine without diverticulitis    Closed fracture of phalanx of foot 01/04/2017   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   Acute hepatitis C 06/05/2002    Past Surgical History:  Procedure Laterality Date   ANTERIOR CERVICAL DECOMP/DISCECTOMY FUSION N/A 09/30/2018   Procedure: ANTERIOR CERVICAL DECOMPRESSION/DISCECTOMY FUSION 1 LEVEL - C 5/6;  Surgeon: Deetta Perla, MD;  Location: [facility];  Service: Neurosurgery;  Laterality: N/A;   CATARACT EXTRACTION W/PHACO Right 02/15/2022   Procedure: CATARACT EXTRACTION PHACO AND INTRAOCULAR LENS PLACEMENT (IOC) RIGHT KAHOOK DUAL BLADE GONIOTOMY DIABETIC 6.21 01:05.9;  Surgeon: Leandrew Koyanagi, MD;  Location: [place];  Service: Ophthalmology;  Laterality: Right;   CHOLECYSTECTOMY N/A 02/06/2018   Procedure: LAPAROSCOPIC CHOLECYSTECTOMY;  Surgeon: Herbert Pun, MD;  Location: [facility];  Service: General;  Laterality: N/A;   COLONOSCOPY WITH PROPOFOL N/A 07/13/2017   Procedure: COLONOSCOPY WITH PROPOFOL;  Surgeon: Virgel Manifold, MD;  Location: [facility];  Service: Endoscopy;  Laterality: N/A;   ESOPHAGOGASTRODUODENOSCOPY (EGD) WITH PROPOFOL N/A 09/14/2017   Procedure: ESOPHAGOGASTRODUODENOSCOPY (EGD) WITH PROPOFOL;  Surgeon: Virgel Manifold, MD;  Location: [facility];  Service: Endoscopy;  Laterality: N/A;    OB History   No  obstetric history on file.      Home Medications    Prior to Admission medications   Medication Sig Start Date End Date Taking? Authorizing Provider  Accu-Chek Softclix Lancets lancets  12/15/19  Yes [provider]  acetaminophen (TYLENOL) 500 MG tablet Take 1,000 mg by mouth 2 (two) times daily as needed for moderate pain or headache.   Yes [provider]  aspirin 81 MG EC tablet Take 81 mg by mouth daily.   Yes [provider]  brimonidine (ALPHAGAN) 0.15 % ophthalmic solution Place 1 drop into both eyes 3 (three) times daily. 05/06/20  Yes [provider]  clotrimazole (LOTRIMIN) 1 % cream Apply 1 application topically daily as needed (rash).   Yes [provider]  COMBIGAN 0.2-0.5 % ophthalmic solution Apply 1 drop to eye 2 (two) times daily. 12/15/19  Yes [provider]  dorzolamide-timolol (COSOPT) 22.3-6.8 MG/ML ophthalmic solution Place 1 drop into both eyes at bedtime. 09/07/20  Yes [provider]  famotidine (PEPCID) 40 MG tablet Take 40 mg by mouth daily. 05/20/20  Yes [provider]  ferrous sulfate 325 (65 FE) MG tablet Take 325 mg by mouth 2 (two) times a day.    Yes [provider]  fluticasone (FLONASE) 50 MCG/ACT nasal spray Place 2 sprays into both nostrils daily as needed. 09/06/20  Yes [provider]  gabapentin (NEURONTIN) 800 MG tablet Take 800 mg by mouth 3 (three) times daily.   Yes [provider]  hydrochlorothiazide (HYDRODIURIL) 25 MG tablet Take 25 mg by mouth daily.   Yes [provider]  ketorolac (TORADOL) 10 MG tablet Take 1 tablet (10 mg total) by mouth every 6 (six) hours as needed for up to 3 days. 04/20/22 04/23/22 Yes Laurene Footman B, PA-C  lidocaine (LIDODERM) 5 % Place 2 patches onto the skin daily as needed (pain). Remove & Discard patch within 12 hours or as directed by MD   Yes [provider]  lisinopril (ZESTRIL) 2.5 MG tablet Take  2.5 mg by mouth daily. 08/03/20  Yes [provider]  Magnesium Oxide 400 MG CAPS Take 1 capsule (400 mg total) by mouth daily. 07/19/21  Yes Theora Gianotti, NP  metFORMIN (GLUCOPHAGE) 500 MG tablet Take 500-1,000 mg by mouth See admin instructions. Take 1000 mg in the morning and 500 mg in the evening 10/21/07  Yes [provider]  promethazine (PHENERGAN) 25 MG tablet 0.5 to 1 tablet q. 3 times daily as needed dizziness or nausea related to migraines. 04/20/22  Yes Danton Clap, PA-C  simvastatin (ZOCOR) 20 MG tablet Take 20 mg by mouth every evening.   Yes [provider]  timolol (BETIMOL) 0.5 % ophthalmic solution Place 1 drop into both eyes 2 (two) times daily.   Yes [provider]    Family History Family History  Problem Relation Age of Onset   Breast cancer Sister 14   Heart attack Mother 61   Hyperlipidemia Mother    Hypertension Mother  Social History Social History   Tobacco Use   Smoking status: Never   Smokeless tobacco: Never  Vaping Use   Vaping Use: Never used  Substance Use Topics   Alcohol use: Not Currently   Drug use: No     Allergies   Sulfa antibiotics, Darvon [propoxyphene], Other, and Tape   Review of Systems Review of Systems  Constitutional:  Negative for fatigue.  Eyes:  Positive for visual disturbance. Negative for photophobia.  Respiratory:  Negative for shortness of breath.   Cardiovascular:  Negative for chest pain and palpitations.  Gastrointestinal:  Positive for nausea. Negative for vomiting.  Musculoskeletal:  Positive for neck pain. Negative for arthralgias, myalgias and neck stiffness.  Neurological:  Positive for dizziness, numbness and headaches. Negative for syncope, facial asymmetry, speech difficulty and weakness.     Physical Exam Triage Vital Signs ED Triage Vitals  Enc Vitals Group     BP 04/20/22 1102 111/65     Pulse Rate 04/20/22 1102 78     Resp 04/20/22 1102 16      Temp 04/20/22 1102 97.8 F (36.6 C)     Temp Source 04/20/22 1102 Oral     SpO2 04/20/22 1102 100 %     Weight 04/20/22 1103 196 lb (88.9 kg)     Height 04/20/22 1103 '5\' 6"'$  (1.676 m)     Head Circumference --      Peak Flow --      Pain Score 04/20/22 1059 8     Pain Loc --      Pain Edu? --      Excl. in [place]? --    No data found.  Updated Vital Signs BP 111/65 (BP Location: Left Arm)   Pulse 78   Temp 97.8 F (36.6 C) (Oral)   Resp 16   Ht '5\' 6"'$  (1.676 m)   Wt 196 lb (88.9 kg)   SpO2 100%   BMI 31.64 kg/m    Physical Exam Vitals and nursing note reviewed.  Constitutional:      General: She is not in acute distress.    Appearance: Normal appearance. She is not ill-appearing or toxic-appearing.  HENT:     Head: Normocephalic and atraumatic.     Nose: Nose normal.     Mouth/Throat:     Mouth: Mucous membranes are moist.     Pharynx: Oropharynx is clear.  Eyes:     General: No scleral icterus.       Right eye: No discharge.        Left eye: No discharge.     Extraocular Movements: Extraocular movements intact.     Conjunctiva/sclera: Conjunctivae normal.     Pupils: Pupils are equal, round, and reactive to light.  Neck:     Comments: No stiffness and full range of motion but tenderness palpation of the right paracervical and left paracervical muscles and bilateral trapezius muscles. Cardiovascular:     Rate and Rhythm: Normal rate and regular rhythm.     Heart sounds: Normal heart sounds.  Pulmonary:     Effort: Pulmonary effort is normal. No respiratory distress.     Breath sounds: Normal breath sounds.  Musculoskeletal:     Cervical back: Neck supple.  Skin:    General: Skin is dry.  Neurological:     General: No focal deficit present.     Mental Status: She is alert and oriented to person, place, and time. Mental status is at baseline.  Cranial Nerves: No cranial nerve deficit.     Motor: No weakness.     Coordination: Coordination normal.     Gait:  Gait normal.     Comments: 5 out of 5 strength bilateral lower extremities and upper extremities  Psychiatric:        Mood and Affect: Mood normal.        Behavior: Behavior normal.        Thought Content: Thought content normal.      UC Treatments / Results  Labs (all labs ordered are listed, but only abnormal results are displayed) Labs Reviewed - No data to display  EKG   Radiology No results found.  Procedures Procedures (including critical care time)  Medications Ordered in UC Medications  ketorolac (TORADOL) injection 30 mg (30 mg Intramuscular Given 04/20/22 1137)    Initial Impression / Assessment and Plan / UC Course  I have reviewed the triage vital signs and the nursing notes.  Pertinent labs & imaging results that were available during my care of the patient were reviewed by me and considered in my medical decision making (see chart for details).   71 year old female with history of chronic migraine presents for worsening symptoms over the past few days.  Reports 4-day history of intermittent but mostly consistent migraine since which is side-to-side and associated with dizziness, nausea and flashes/floaters.  Reports this is consistent with her typical migraine but more frequent than she is used to.  Has not responded to her typical medications.  Vitals normal and stable and patient is overall well-appearing.  Exam is normal other than some tenderness to palpation of the musculature of her neck.  She says she always gets neck discomfort and says that is pretty consistent and not worse recently.  No stiffness and full range of motion.  Normal neurological exam.  Discussed options with patient including trying Toradol injection here since it has helped previously and she has not needed Toradol today, dexamethasone injection and trying promethazine at home.  Discussed also that she might want to go to the emergency department since her symptoms have been greater than 3  days without resolution and she is interested in CT imaging.  Advised her we do not have that available in the clinic today.  Patient does not want to go to the emergency department.  She would like referral to neurology and to try Toradol again.  Patient given 30 mg IM Toradol in clinic and I prescribed promethazine.  Placed referral to neurology.  Advised patient to go to the ER sooner if she has any acute worsening of her symptoms or if she still not getting better in the next 24 hours.   Final Clinical Impressions(s) / UC Diagnoses   Final diagnoses:  Intractable migraine with status migrainosus, unspecified migraine type     Discharge Instructions      HEADACHE: You were seen in clinic today for headache. Rest and take meds as directed. If at any point, the headache becomes very severe, is associated with fever, is associated with neck pain/stiffness, you feel like passing out, the headache is different from any you've have had before, there are vision changes/issues with speech/issues with balance, or numbness/weakness in a part of the body, you should be seen urgently or emergently for more serious causes of headache   We discussed that when migraine headaches go more than 72 hours without resolving they are more difficult to treat and sometimes feel need to go to the emergency  department for treatment.  We have given you an injection of ketorolac/Toradol in the clinic.  I hope that helps you.  I sent more this medication for you to use at home as needed as well as promethazine.  If your headache worsens or you have any red flag signs or symptoms listed above he should go to the ER sooner for evaluation and consideration of CT scan.  As we discussed, we do not have CT in urgent care.  I did place a referral to neurology for you but it is in [place].  If you need something closer you should reach back out to [place] clinic.    ED Prescriptions     Medication Sig Dispense Auth. Provider    promethazine (PHENERGAN) 25 MG tablet 0.5 to 1 tablet q. 3 times daily as needed dizziness or nausea related to migraines. 30 tablet Laurene Footman B, PA-C   ketorolac (TORADOL) 10 MG tablet Take 1 tablet (10 mg total) by mouth every 6 (six) hours as needed for up to 3 days. 12 tablet Gretta Cool      PDMP not reviewed this encounter.   Danton Clap, PA-C 04/20/22 1156

## 2022-04-25 ENCOUNTER — Other Ambulatory Visit: Payer: Self-pay | Admitting: Family Medicine

## 2022-04-25 DIAGNOSIS — M5416 Radiculopathy, lumbar region: Secondary | ICD-10-CM

## 2022-04-27 ENCOUNTER — Inpatient Hospital Stay: Admission: RE | Admit: 2022-04-27 | Payer: Medicare Other | Source: Ambulatory Visit

## 2022-05-08 ENCOUNTER — Ambulatory Visit (INDEPENDENT_AMBULATORY_CARE_PROVIDER_SITE_OTHER): Payer: 59 | Admitting: Internal Medicine

## 2022-05-08 ENCOUNTER — Encounter: Payer: Self-pay | Admitting: Internal Medicine

## 2022-05-08 VITALS — BP 128/84 | HR 70 | Temp 96.6°F | Ht 66.0 in | Wt 198.0 lb

## 2022-05-08 DIAGNOSIS — E782 Mixed hyperlipidemia: Secondary | ICD-10-CM | POA: Diagnosis not present

## 2022-05-08 DIAGNOSIS — K219 Gastro-esophageal reflux disease without esophagitis: Secondary | ICD-10-CM

## 2022-05-08 DIAGNOSIS — M48062 Spinal stenosis, lumbar region with neurogenic claudication: Secondary | ICD-10-CM | POA: Diagnosis not present

## 2022-05-08 DIAGNOSIS — E1169 Type 2 diabetes mellitus with other specified complication: Secondary | ICD-10-CM

## 2022-05-08 DIAGNOSIS — I700 Atherosclerosis of aorta: Secondary | ICD-10-CM | POA: Insufficient documentation

## 2022-05-08 DIAGNOSIS — Z6831 Body mass index (BMI) 31.0-31.9, adult: Secondary | ICD-10-CM

## 2022-05-08 DIAGNOSIS — G43719 Chronic migraine without aura, intractable, without status migrainosus: Secondary | ICD-10-CM

## 2022-05-08 DIAGNOSIS — D509 Iron deficiency anemia, unspecified: Secondary | ICD-10-CM

## 2022-05-08 DIAGNOSIS — E6609 Other obesity due to excess calories: Secondary | ICD-10-CM | POA: Insufficient documentation

## 2022-05-08 DIAGNOSIS — I10 Essential (primary) hypertension: Secondary | ICD-10-CM | POA: Diagnosis not present

## 2022-05-08 DIAGNOSIS — E663 Overweight: Secondary | ICD-10-CM | POA: Insufficient documentation

## 2022-05-08 DIAGNOSIS — E66811 Obesity, class 1: Secondary | ICD-10-CM | POA: Insufficient documentation

## 2022-05-08 DIAGNOSIS — Z6828 Body mass index (BMI) 28.0-28.9, adult: Secondary | ICD-10-CM | POA: Insufficient documentation

## 2022-05-08 MED ORDER — OMEPRAZOLE 20 MG PO CPDR: 20.0000 mg | DELAYED_RELEASE_CAPSULE | Freq: Every day | ORAL | 1 refills | Status: DC

## 2022-05-08 NOTE — Assessment & Plan Note (Signed)
C-Met and lipid profile today Encouraged her to consume a low-fat diet Continue simvastatin and aspirin

## 2022-05-08 NOTE — Assessment & Plan Note (Signed)
Continue Lidoderm, Tylenol, cyclobenzaprine and gabapentin Referral to neurosurgery placed per her request

## 2022-05-08 NOTE — Assessment & Plan Note (Signed)
POCT A1c and urine microalbumin today Encouraged her to do in a low-carb diet and exercise for weight loss Continue metformin Will request copy of eye exam Will request copy of immunizations from prior PCP Encourage routine foot exam, referral to podiatry placed

## 2022-05-08 NOTE — Progress Notes (Signed)
HPI  Patient presents to clinic today to establish care and for management of the conditions listed below.  GERD: Triggered by spicy foods.  She does have breakthrough on Famotidine.  Upper GI from 08/2017 reviewed.  DM2: There is no recent A1c on file.  She is taking Metformin as prescribed.  She does not check her blood sugars.  She checks her feet routinely but would like a referral to podiatry.  Her last eye exam last in 2023, [place].  Flu 01/2022.  Pneumovax 2006.  Prevnar never.  COVID x 5.  HTN: Her BP today is 128/84.  She is taking Propranolol, Lisinopril and HCTZ as prescribed.  ECG from 04/2021 reviewed.  Lumbar Spinal Stenosis: Chronic, managed with  Lidoderm, Tylenol, Cyclobenzaprine and Gabapentin.  She follows with pain management who has referred her to neurosurgery at [facility] in [place] however she would like to be somewhere locally.  HLD with Aortic Atherosclerosis: There is no recent lipid panel on file.  She denies myalgias on Simvastatin. She is taking Aspirin as well. She tries to consume low-fat diet.  Iron Deficiency Anemia: Her last H/H was 11.1/34.7, 04/2021.  She is taking oral Iron as prescribed.  She does not follow with hematology.  Migraines: These occur almost. She is not sure what triggers this. She is taking Nortriptyline, Propranolol and Imitrex as prescribed. She follows with neurology but would like a referral for second opinion.  Past Medical History:  Diagnosis Date   Anemia    Borderline diabetes mellitus    Elevated lipids    GERD (gastroesophageal reflux disease)    Glaucoma    Headache    h/o migraines   Hypertension    Neuropathy    Palpitations    a. 05/2020 [facility]: Predominantly sinus rhythm, 80 (60-148).  1 SVT run-4 beats at 148.  Rare PACs and PVCs; b. 04/2021 [facility]io: Predominantly sinus rhythm, 80 (59-140).  Rare PACs/PVCs.   Panic attack     Current Outpatient Medications  Medication Sig Dispense Refill   Accu-Chek Softclix Lancets lancets       acetaminophen (TYLENOL) 500 MG tablet Take 1,000 mg by mouth 2 (two) times daily as needed for moderate pain or headache.     aspirin 81 MG EC tablet Take 81 mg by mouth daily.     brimonidine (ALPHAGAN) 0.15 % ophthalmic solution Place 1 drop into both eyes 3 (three) times daily.     clotrimazole (LOTRIMIN) 1 % cream Apply 1 application topically daily as needed (rash).     COMBIGAN 0.2-0.5 % ophthalmic solution Apply 1 drop to eye 2 (two) times daily.     dorzolamide-timolol (COSOPT) 22.3-6.8 MG/ML ophthalmic solution Place 1 drop into both eyes at bedtime.     famotidine (PEPCID) 40 MG tablet Take 40 mg by mouth daily.     ferrous sulfate 325 (65 FE) MG tablet Take 325 mg by mouth 2 (two) times a day.      fluticasone (FLONASE) 50 MCG/ACT nasal spray Place 2 sprays into both nostrils daily as needed.     gabapentin (NEURONTIN) 800 MG tablet Take 800 mg by mouth 3 (three) times daily.     hydrochlorothiazide (HYDRODIURIL) 25 MG tablet Take 25 mg by mouth daily.     lidocaine (LIDODERM) 5 % Place 2 patches onto the skin daily as needed (pain). Remove & Discard patch within 12 hours or as directed by MD     lisinopril (ZESTRIL) 2.5 MG tablet Take 2.5 mg by  mouth daily.     Magnesium Oxide 400 MG CAPS Take 1 capsule (400 mg total) by mouth daily.  0   metFORMIN (GLUCOPHAGE) 500 MG tablet Take 500-1,000 mg by mouth See admin instructions. Take 1000 mg in the morning and 500 mg in the evening     promethazine (PHENERGAN) 25 MG tablet 0.5 to 1 tablet q. 3 times daily as needed dizziness or nausea related to migraines. 30 tablet 0   simvastatin (ZOCOR) 20 MG tablet Take 20 mg by mouth every evening.     timolol (BETIMOL) 0.5 % ophthalmic solution Place 1 drop into both eyes 2 (two) times daily.     No current facility-administered medications for this visit.    Allergies  Allergen Reactions   Sulfa Antibiotics Dermatitis   Darvon [Propoxyphene] Hives   Other Other (See Comments) and Rash     PAPER TAPE OK TO USE PAPER TAPE OK TO USE    Tape Rash    PAPER TAPE OK TO USE    Family History  Problem Relation Age of Onset   Breast cancer Sister 24   Heart attack Mother 28   Hyperlipidemia Mother    Hypertension Mother     Social History   Socioeconomic History   Marital status: Married    Spouse name: Not on file   Number of children: Not on file   Years of education: Not on file   Highest education level: Not on file  Occupational History   Not on file  Tobacco Use   Smoking status: Never   Smokeless tobacco: Never  Vaping Use   Vaping Use: Never used  Substance and Sexual Activity   Alcohol use: Not Currently   Drug use: No   Sexual activity: Yes  Other Topics Concern   Not on file  Social History Narrative   Not on file   Social Determinants of Health   Financial Resource Strain: Not on file  Food Insecurity: Not on file  Transportation Needs: Not on file  Physical Activity: Not on file  Stress: Not on file  Social Connections: Not on file  Intimate Partner Violence: Not on file    ROS:  Constitutional: Patient reports daily headaches.  Denies fever, malaise, fatigue, or abrupt weight changes.  HEENT: Denies eye pain, eye redness, ear pain, ringing in the ears, wax buildup, runny nose, nasal congestion, bloody nose, or sore throat. Respiratory: Denies difficulty breathing, shortness of breath, cough or sputum production.   Cardiovascular: Denies chest pain, chest tightness, palpitations or swelling in the hands or feet.  Gastrointestinal: Patient reports reflux.  Denies abdominal pain, bloating, constipation, diarrhea or blood in the stool.  GU: Denies frequency, urgency, pain with urination, blood in urine, odor or discharge. Musculoskeletal: Patient reports chronic low back pain.  Denies decrease in range of motion, difficulty with gait, muscle pain or joint swelling.  Skin: Denies redness, rashes, lesions or ulcercations.  Neurological:  Patient reports neuropathic pain.  Denies dizziness, difficulty with memory, difficulty with speech or problems with balance and coordination.  Psych: Denies anxiety, depression, SI/HI.  No other specific complaints in a complete review of systems (except as listed in HPI above).  PE: BP 128/84 (BP Location: Left Arm, Patient Position: Sitting, Cuff Size: Normal)   Pulse 70   Temp (!) 96.6 F (35.9 C) (Temporal)   Ht '5\' 6"'$  (1.676 m)   Wt 198 lb (89.8 kg)   SpO2 98%   BMI 31.96 kg/m  Wt Readings from Last 3 Encounters:  04/20/22 196 lb (88.9 kg)  02/15/22 195 lb (88.5 kg)  07/19/21 195 lb (88.5 kg)    General: Appears her stated age, obese, in NAD. HEENT: Head: normal shape and size; Eyes: sclera white, no icterus, conjunctiva pink, PERRLA and EOMs intact;  Neck: Neck supple, trachea midline. No masses, lumps or thyromegaly present.  Cardiovascular: Normal rate and rhythm. S1,S2 noted.  No murmur, rubs or gallops noted. No JVD or BLE edema. No carotid bruits noted. Pulmonary/Chest: Normal effort and positive vesicular breath sounds. No respiratory distress. No wheezes, rales or ronchi noted.  Abdomen:  Normal bowel sounds. Musculoskeletal: Decreased flexion of the spine.  Normal extension, rotation and lateral bending.  No bony tenderness noted over the lumbar spine.  Pain with palpation of the right SI joint.  Strength 5/5 BUE/BLE.  No difficulty with gait.  Neurological: Alert and oriented. Cranial nerves II-XII grossly intact. Coordination normal.  Psychiatric: Mood and affect normal. Behavior is normal. Judgment and thought content normal.     BMET    Component Value Date/Time   NA 139 09/18/2018 1030   K 3.8 09/18/2018 1030   CL 102 09/18/2018 1030   CO2 28 09/18/2018 1030   GLUCOSE 102 (H) 09/18/2018 1030   BUN 14 09/18/2018 1030   CREATININE 0.68 09/18/2018 1030   CALCIUM 8.8 (L) 09/18/2018 1030   GFRNONAA >60 09/18/2018 1030   GFRAA >60 09/18/2018 1030     Lipid Panel  No results found for: "CHOL", "TRIG", "HDL", "CHOLHDL", "VLDL", "LDLCALC"  CBC    Component Value Date/Time   WBC 6.1 09/18/2018 1030   RBC 4.65 09/18/2018 1030   HGB 10.5 (L) 09/18/2018 1030   HCT 34.0 (L) 09/18/2018 1030   PLT 238 09/18/2018 1030   MCV 73.1 (L) 09/18/2018 1030   MCH 22.6 (L) 09/18/2018 1030   MCHC 30.9 09/18/2018 1030   RDW 15.5 09/18/2018 1030   LYMPHSABS 2.4 06/05/2018 0608   MONOABS 0.5 06/05/2018 0608   EOSABS 0.1 06/05/2018 0608   BASOSABS 0.1 06/05/2018 0608    Hgb A1C No results found for: "HGBA1C"   Assessment and Plan:    RTC in 6 months for your annual exam Webb Silversmith, NP

## 2022-05-08 NOTE — Assessment & Plan Note (Signed)
Encourage diet and exercise for weight loss 

## 2022-05-08 NOTE — Assessment & Plan Note (Signed)
Avoid foods that trigger reflux Encourage weight loss as this can help reduce reflux symptoms Will start omeprazole 20 mg daily in a.m. Start taking your Pepcid at bedtime

## 2022-05-08 NOTE — Assessment & Plan Note (Signed)
CBC and iron panel today Continue oral iron

## 2022-05-08 NOTE — Patient Instructions (Signed)

## 2022-05-08 NOTE — Assessment & Plan Note (Signed)
Not doing well on current regimen Referral to neurology placed per her request

## 2022-05-08 NOTE — Assessment & Plan Note (Signed)
C-Met and lipid profile today Encouraged her to consume a low-fat diet Continue simvastatin 

## 2022-05-08 NOTE — Assessment & Plan Note (Signed)
Controlled on propranolol, lisinopril and HCTZ Reinforced DASH diet and exercise for weight loss C-Met today

## 2022-05-09 ENCOUNTER — Telehealth: Payer: Self-pay | Admitting: *Deleted

## 2022-05-09 ENCOUNTER — Other Ambulatory Visit: Payer: Self-pay | Admitting: *Deleted

## 2022-05-09 LAB — COMPLETE METABOLIC PANEL WITH GFR
AG Ratio: 1.7 (calc) (ref 1.0–2.5)
ALT: 16 U/L (ref 6–29)
AST: 14 U/L (ref 10–35)
Albumin: 4.2 g/dL (ref 3.6–5.1)
Alkaline phosphatase (APISO): 71 U/L (ref 37–153)
BUN: 14 mg/dL (ref 7–25)
CO2: 31 mmol/L (ref 20–32)
Calcium: 9.5 mg/dL (ref 8.6–10.4)
Chloride: 103 mmol/L (ref 98–110)
Creat: 0.74 mg/dL (ref 0.50–1.05)
Globulin: 2.5 g/dL (calc) (ref 1.9–3.7)
Glucose, Bld: 120 mg/dL — ABNORMAL HIGH (ref 65–99)
Potassium: 4.5 mmol/L (ref 3.5–5.3)
Sodium: 141 mmol/L (ref 135–146)
Total Bilirubin: 0.3 mg/dL (ref 0.2–1.2)
Total Protein: 6.7 g/dL (ref 6.1–8.1)
eGFR: 88 mL/min/{1.73_m2} (ref >=60)

## 2022-05-09 LAB — LIPID PANEL
Cholesterol: 133 mg/dL (ref <200)
HDL: 54 mg/dL (ref >=50)
LDL Cholesterol (Calc): 63 mg/dL (calc)
Non-HDL Cholesterol (Calc): 79 mg/dL (calc) (ref <130)
Total CHOL/HDL Ratio: 2.5 (calc) (ref <5.0)
Triglycerides: 78 mg/dL (ref <150)

## 2022-05-09 LAB — CBC
HCT: 36.1 % (ref 35.0–45.0)
Hemoglobin: 11.4 g/dL — ABNORMAL LOW (ref 11.7–15.5)
MCH: 22.5 pg — ABNORMAL LOW (ref 27.0–33.0)
MCHC: 31.6 g/dL — ABNORMAL LOW (ref 32.0–36.0)
MCV: 71.2 fL — ABNORMAL LOW (ref 80.0–100.0)
MPV: 11.1 fL (ref 7.5–12.5)
Platelets: 271 10*3/uL (ref 140–400)
RBC: 5.07 10*6/uL (ref 3.80–5.10)
RDW: 16.4 % — ABNORMAL HIGH (ref 11.0–15.0)
WBC: 7.9 10*3/uL (ref 3.8–10.8)

## 2022-05-09 LAB — HEMOGLOBIN A1C
Hgb A1c MFr Bld: 7 % of total Hgb — ABNORMAL HIGH (ref <5.7)
Mean Plasma Glucose: 154 mg/dL
eAG (mmol/L): 8.5 mmol/L

## 2022-05-09 LAB — MICROALBUMIN / CREATININE URINE RATIO
Creatinine, Urine: 53 mg/dL (ref 20–275)
Microalb, Ur: <0.2 mg/dL

## 2022-05-09 LAB — IRON,TIBC AND FERRITIN PANEL
%SAT: 20 % (calc) (ref 16–45)
Ferritin: 83 ng/mL (ref 16–288)
Iron: 79 ug/dL (ref 45–160)
TIBC: 397 mcg/dL (calc) (ref 250–450)

## 2022-05-09 MED ORDER — FERROUS SULFATE 325 (65 FE) MG PO TABS: 325.0000 mg | ORAL_TABLET | Freq: Two times a day (BID) | ORAL | 1 refills | Status: DC

## 2022-05-09 NOTE — Telephone Encounter (Signed)
Pt calling for results, reviewed, Pt verbalizes understanding.   "She is slightly anemic but this is improving.  Iron levels are normal.  Continue oral iron.  Liver and kidney function is normal.  Her A1c is 7% which indicates that her diabetes is well-controlled on her current regimen.  Cholesterol looks great.  Consume a low-carb, low saturated fat diet and try to exercise for weight loss.  I will see her back in 6 months for her physical. "  Pt has order for MRI, ordered 04/25/22. Pt states she was just awakening from sleep when someone called to schedule "But not sure it was them." Did not see appt noted. Please review.

## 2022-05-12 ENCOUNTER — Ambulatory Visit (INDEPENDENT_AMBULATORY_CARE_PROVIDER_SITE_OTHER): Payer: 59 | Admitting: Physician Assistant

## 2022-05-12 ENCOUNTER — Ambulatory Visit: Payer: Self-pay | Admitting: *Deleted

## 2022-05-12 ENCOUNTER — Encounter: Payer: Self-pay | Admitting: Physician Assistant

## 2022-05-12 VITALS — BP 126/80 | HR 93 | Temp 96.6°F | Wt 198.0 lb

## 2022-05-12 DIAGNOSIS — R1084 Generalized abdominal pain: Secondary | ICD-10-CM

## 2022-05-12 DIAGNOSIS — K5732 Diverticulitis of large intestine without perforation or abscess without bleeding: Secondary | ICD-10-CM

## 2022-05-12 NOTE — Telephone Encounter (Signed)
  Chief Complaint: Stomach/abd pain all over since 7:30 last night. Symptoms: Having a lot of gas. Frequency: Since 7:30 last night when it started    Feels similar to pain had when she had an ulcer years ago. Pertinent Negatives: Patient denies V/D/N or constipation Disposition: '[]'$ ED /'[]'$ Urgent Care (no appt availability in office) / '[x]'$ Appointment(In office/virtual)/ '[]'$  [facility]/ '[]'$ Home Care/ '[]'$ Refused Recommended Disposition /'[]'$ [facility]/ '[]'$  Follow-up with PCP Additional Notes: Appt. Made today with Talitha Givens, PA-C who is covering for Webb Silversmith, NP for 3:40.

## 2022-05-12 NOTE — Patient Instructions (Signed)
You can try some Pepto to see if that will help with the gas and stomach discomfort  You can also take Tylenol for pain  Please continue to use your Prilosec and Pepcid for your reflux   It is possible that you have diverticulitis right now. The main thing we recommend for this is something we call bowel rest- liquid diet and staying well hydrated until your stomach starts to feel better  You can slowly reintroduce regular foods - start with things that are soft, like bananas, mashed potatoes, applesauce then gradually to more solid   If you notice any of the following please go to the ED for evaluation: fever, chills, your stomach feels hard and tight, increased bloating, bloody stools, confusion, low blood pressure,

## 2022-05-12 NOTE — Telephone Encounter (Signed)
Reason for Disposition  Age > 60 years  Answer Assessment - Initial Assessment Questions 1. LOCATION: "Where does it hurt?"      Having stomach pain all over.   Pain is from bottom of navel to top of stomach. 2. RADIATION: "Does the pain shoot anywhere else?" (e.g., chest, back)     No 3. ONSET: "When did the pain begin?" (e.g., minutes, hours or days ago)      7:30 last night it was hurting so bad that's when it started.   Last night it hurt bad enough I could not sleep.     I took Mylanta but it didn't help I had an ulcer many years ago. I'm eating. 4. SUDDEN: "Gradual or sudden onset?"     Sudden pain last night 5. PATTERN "Does the pain come and go, or is it constant?"    - If it comes and goes: "How long does it last?" "Do you have pain now?"     (Note: Comes and goes means the pain is intermittent. It goes away completely between bouts.)    - If constant: "Is it getting better, staying the same, or getting worse?"      (Note: Constant means the pain never goes away completely; most serious pain is constant and gets worse.)      Constant pain.  It's annoying.  6. SEVERITY: "How bad is the pain?"  (e.g., Scale 1-10; mild, moderate, or severe)    - MILD (1-3): Doesn't interfere with normal activities, abdomen soft and not tender to touch.     - MODERATE (4-7): Interferes with normal activities or awakens from sleep, abdomen tender to touch.     - SEVERE (8-10): Excruciating pain, doubled over, unable to do any normal activities.       6/10 7. RECURRENT SYMPTOM: "Have you ever had this type of stomach pain before?" If Yes, ask: "When was the last time?" and "What happened that time?"      I had an ulcer many years ago 8. CAUSE: "What do you think is causing the stomach pain?"     Not sure 9. RELIEVING/AGGRAVATING FACTORS: "What makes it better or worse?" (e.g., antacids, bending or twisting motion, bowel movement)     I took Mylanta and it helped a little bit last night. 10. OTHER  SYMPTOMS: "Do you have any other symptoms?" (e.g., back pain, diarrhea, fever, urination pain, vomiting)       No diarrhea or constipation or vomiting.    No urinary symptoms  11. PREGNANCY: "Is there any chance you are pregnant?" "When was your last menstrual period?"       N/A  Protocols used: Abdominal Pain - [facility]

## 2022-05-12 NOTE — Progress Notes (Signed)
Acute Office Visit   Patient: Stacy Cunningham   DOB: 1950-08-05   72 y.o. Female  MRN: [id number] Visit Date: 05/12/2022  Today's healthcare provider: Dani Gobble Isahi Godwin, PA-C  Introduced myself to the patient as a Journalist, newspaper and provided education on APPs in clinical practice.    Chief Complaint  Patient presents with   Abdominal Pain   Subjective    HPI    Abdominal Pain and increased gas/bloating  Onset: sudden  Duration: since last night- around 7 pm  Pain level and character: 6/10 constant, nagging, achy and sore  Location: generalized abdominal pain  Associated symptoms: She reports some constipation on Monday and Tuesday but this has resolved  Interventions: Mylanta last night  Alleviating: mild relief with Mylanta last night  Aggravating: Nothing  Pain does not seem to improve with passing gas She reports increased flatulence since last night  She has not had a bowel movement today but had 3 normal bowel movements yesterday.   She has been eating vegetable soup for dinner last night and for lunch. (Green beans, carrots, potatoes, black eyed peas, beef)  She reports that she eats vegetables regularly- spinach, broccoli, turnip greens  She is able to tolerate PO intake and this does not seem to make symptoms worse She denies pain increasing in severity several hours after eating  She takes Famotidine at night and Prilosec in the AM    Colonoscopy in 2019 did find diverticulosis of large intestine    Medications: Outpatient Medications Prior to Visit  Medication Sig   Accu-Chek Softclix Lancets lancets    acetaminophen (TYLENOL) 500 MG tablet Take 1,000 mg by mouth 2 (two) times daily as needed for moderate pain or headache.   aspirin 81 MG EC tablet Take 81 mg by mouth daily.   brimonidine (ALPHAGAN) 0.15 % ophthalmic solution Place 1 drop into both eyes 3 (three) times daily.   clotrimazole-betamethasone (LOTRISONE) cream Apply topically.   COMBIGAN 0.2-0.5 %  ophthalmic solution Apply 1 drop to eye 2 (two) times daily.   cyclobenzaprine (FLEXERIL) 5 MG tablet Take by mouth.   dorzolamide-timolol (COSOPT) 22.3-6.8 MG/ML ophthalmic solution Place 1 drop into both eyes at bedtime.   famotidine (PEPCID) 40 MG tablet Take 40 mg by mouth daily.   ferrous sulfate 325 (65 FE) MG tablet Take 1 tablet (325 mg total) by mouth 2 (two) times daily with a meal.   gabapentin (NEURONTIN) 800 MG tablet Take 800 mg by mouth 3 (three) times daily.   hydrochlorothiazide (HYDRODIURIL) 25 MG tablet Take 25 mg by mouth daily.   lidocaine (LIDODERM) 5 % Place 2 patches onto the skin daily as needed (pain). Remove & Discard patch within 12 hours or as directed by MD   lisinopril (ZESTRIL) 2.5 MG tablet Take 2.5 mg by mouth daily.   metFORMIN (GLUCOPHAGE) 500 MG tablet Take 500-1,000 mg by mouth See admin instructions. Take 1000 mg in the morning and 500 mg in the evening   nortriptyline (PAMELOR) 10 MG capsule Take 10 mg by mouth 2 (two) times daily.   omeprazole (PRILOSEC) 20 MG capsule Take 1 capsule (20 mg total) by mouth daily.   propranolol (INDERAL) 20 MG tablet Take 20 mg by mouth 2 (two) times daily.   simvastatin (ZOCOR) 20 MG tablet Take 20 mg by mouth every evening.   SUMAtriptan (IMITREX) 100 MG tablet Take by mouth.   timolol (BETIMOL) 0.5 % ophthalmic solution Place 1 drop  into both eyes 2 (two) times daily.   No facility-administered medications prior to visit.    Review of Systems  Constitutional:  Negative for chills and fever.  Gastrointestinal:  Positive for abdominal pain and constipation (Monday or Tuesday - resolved now). Negative for abdominal distention, blood in stool, diarrhea, nausea and vomiting.       Objective    BP 126/80 (BP Location: Right Arm, Patient Position: Sitting, Cuff Size: Normal)   Pulse 93   Temp (!) 96.6 F (35.9 C) (Temporal)   Wt 198 lb (89.8 kg)   SpO2 99%   BMI 31.96 kg/m    Physical Exam Vitals reviewed.   Constitutional:      General: She is awake.     Appearance: Normal appearance. She is well-developed and well-groomed.  HENT:     Head: Normocephalic and atraumatic.  Eyes:     Extraocular Movements: Extraocular movements intact.     Conjunctiva/sclera: Conjunctivae normal.  Pulmonary:     Effort: Pulmonary effort is normal.  Abdominal:     General: Abdomen is flat. Bowel sounds are normal. There is no distension.     Palpations: Abdomen is soft. There is no hepatomegaly or splenomegaly.     Tenderness: There is generalized abdominal tenderness and tenderness in the left upper quadrant and left lower quadrant. There is no guarding or rebound. Negative signs include Murphy's sign and McBurney's sign.  Musculoskeletal:     Cervical back: Normal range of motion.  Skin:    General: Skin is warm and dry.  Neurological:     General: No focal deficit present.     Mental Status: She is alert and oriented to person, place, and time.  Psychiatric:        Mood and Affect: Mood normal.        Behavior: Behavior normal. Behavior is cooperative.        Thought Content: Thought content normal.        Judgment: Judgment normal.       No results found for any visits on 05/12/22.  Assessment & Plan      No follow-ups on file.      Problem List Items Addressed This Visit   None Visit Diagnoses     Diverticulitis of large intestine without bleeding, unspecified complication status    -  Primary Acute, new concern She reports generalized abdominal pain and tenderness to palpation in LUQ and LLQ since last night that was mildly relieved with Mylanta Diet does not seem to have been different from her typical intake prior to pain and she reports regular bowel movements since Tuesday Review of her colonoscopy results from 2019 reveals diverticulosis findings  Suspect diverticulitis today given HPI, PE and colonoscopy results Explained that right now, a liquid diet and bowel rest would be  most beneficial until she is feeling better and can slowly, gradually reintroduce more solid foods Recommend she continue to take her reflux medications and can try adding Pepto to relieve some of her flatulence Reviewed ED and return precautions with her and provided education materials about diverticulitis and liquid diet in printed AVS Also placed referral to GI at her request given hx and symptoms Recommend follow up as needed for persistent or progressing symptoms    Relevant Orders   Ambulatory referral to Gastroenterology   Generalized abdominal pain     Acute, new concern  Suspect her abdominal pain is likely caused by diverticulitis at this time Differential includes but is  not limited to: indigestion, gastroenteritis, bloating from dietary intake, constipation, gastric ulcer, H.pylori infection She reports regular, nonbloody bowel movements yesterday, and denies feeling feverish, chills, nausea, vomiting so I am more skeptical of the above as etiology at this time  She has been taking GERD medications and denies correlation with pain and PO intake  Recommend bowel rest and hydration, tylenol PRN for pain management and provided return and ED precautions         No follow-ups on file.   I, Ryman Rathgeber E Careena Degraffenreid, PA-C, have reviewed all documentation for this visit. The documentation on 05/12/22 for the exam, diagnosis, procedures, and orders are all accurate and complete.   Talitha Givens, MHS, PA-C [facility]

## 2022-05-15 ENCOUNTER — Other Ambulatory Visit: Payer: Self-pay

## 2022-05-15 MED ORDER — CLOTRIMAZOLE-BETAMETHASONE 1-0.05 % EX CREA: TOPICAL_CREAM | Freq: Two times a day (BID) | CUTANEOUS | 0 refills | Status: DC

## 2022-05-19 ENCOUNTER — Telehealth: Payer: Self-pay | Admitting: Internal Medicine

## 2022-05-19 NOTE — Telephone Encounter (Signed)
Pt was advised to get something for her claustrophobia for her Gi appt on Tuesday 1.30.24/ please advise asap

## 2022-05-19 NOTE — Telephone Encounter (Signed)
Pt states it was actually for her MRI 05/26/2022.  I advised her to call [facility] they are the ones who ordered the MRI.   Thanks,   -[name]

## 2022-05-19 NOTE — Telephone Encounter (Signed)
How is her seeing a GI doctor affecting her claustrophobia?

## 2022-05-22 ENCOUNTER — Ambulatory Visit
Admission: RE | Admit: 2022-05-22 | Discharge: 2022-05-22 | Disposition: A | Discharge status: Home / Self Care | Payer: Medicaid Other | Source: Ambulatory Visit | Attending: Otolaryngology | Admitting: Otolaryngology

## 2022-05-22 DIAGNOSIS — E041 Nontoxic single thyroid nodule: Secondary | ICD-10-CM

## 2022-05-23 ENCOUNTER — Other Ambulatory Visit: Payer: Self-pay

## 2022-05-23 ENCOUNTER — Encounter: Payer: Self-pay | Admitting: Gastroenterology

## 2022-05-23 ENCOUNTER — Ambulatory Visit (INDEPENDENT_AMBULATORY_CARE_PROVIDER_SITE_OTHER): Payer: 59 | Admitting: Gastroenterology

## 2022-05-23 VITALS — BP 125/74 | HR 101 | Temp 99.0°F | Ht 66.0 in | Wt 197.0 lb

## 2022-05-23 DIAGNOSIS — Z8719 Personal history of other diseases of the digestive system: Secondary | ICD-10-CM

## 2022-05-23 MED ORDER — NA SULFATE-K SULFATE-MG SULF 17.5-3.13-1.6 GM/177ML PO SOLN: 354.0000 mL | Freq: Once | ORAL | 0 refills | Status: AC

## 2022-05-23 NOTE — Patient Instructions (Addendum)
Please try taking IBGuard 2 capsules up to three times a day as needed. If it works, you are able to purchase these at your local pharmacy.  High-Fiber Eating Plan Fiber, also called dietary fiber, is a type of carbohydrate. It is found foods such as fruits, vegetables, whole grains, and beans. A high-fiber diet can have many health benefits. Your health care provider may recommend a high-fiber diet to help: Prevent constipation. Fiber can make your bowel movements more regular. Lower your cholesterol. Relieve the following conditions: Inflammation of veins in the anus (hemorrhoids). Inflammation of specific areas of the digestive tract (uncomplicated diverticulosis). A problem of the large intestine, also called the colon, that sometimes causes pain and diarrhea (irritable bowel syndrome, or IBS). Prevent overeating as part of a weight-loss plan. Prevent heart disease, type 2 diabetes, and certain cancers. What are tips for following this plan? Reading food labels  Check the nutrition facts label on food products for the amount of dietary fiber. Choose foods that have 5 grams of fiber or more per serving. The goals for recommended daily fiber intake include: Men (age 23 or younger): 34-38 g. Men (over age 40): 28-34 g. Women (age 94 or younger): 25-28 g. Women (over age 48): 22-25 g. Your daily fiber goal is _____________ g. Shopping Choose whole fruits and vegetables instead of processed forms, such as apple juice or applesauce. Choose a wide variety of high-fiber foods such as avocados, lentils, oats, and kidney beans. Read the nutrition facts label of the foods you choose. Be aware of foods with added fiber. These foods often have high sugar and sodium amounts per serving. Cooking Use whole-grain flour for baking and cooking. Cook with brown rice instead of white rice. Meal planning Start the day with a breakfast that is high in fiber, such as a cereal that contains 5 g of fiber or  more per serving. Eat breads and cereals that are made with whole-grain flour instead of refined flour or white flour. Eat brown rice, bulgur wheat, or millet instead of white rice. Use beans in place of meat in soups, salads, and pasta dishes. Be sure that half of the grains you eat each day are whole grains. General information You can get the recommended daily intake of dietary fiber by: Eating a variety of fruits, vegetables, grains, nuts, and beans. Taking a fiber supplement if you are not able to take in enough fiber in your diet. It is better to get fiber through food than from a supplement. Gradually increase how much fiber you consume. If you increase your intake of dietary fiber too quickly, you may have bloating, cramping, or gas. Drink plenty of water to help you digest fiber. Choose high-fiber snacks, such as berries, raw vegetables, nuts, and popcorn. What foods should I eat? Fruits Berries. Pears. Apples. Oranges. Avocado. Prunes and raisins. Dried figs. Vegetables Sweet potatoes. Spinach. Kale. Artichokes. Cabbage. Broccoli. Cauliflower. Green peas. Carrots. Squash. Grains Whole-grain breads. Multigrain cereal. Oats and oatmeal. Brown rice. Barley. Bulgur wheat. Laurel. Quinoa. Bran muffins. Popcorn. Rye wafer crackers. Meats and other proteins Navy beans, kidney beans, and pinto beans. Soybeans. Split peas. Lentils. Nuts and seeds. Dairy Fiber-fortified yogurt. Beverages Fiber-fortified soy milk. Fiber-fortified orange juice. Other foods Fiber bars. The items listed above may not be a complete list of recommended foods and beverages. Contact a dietitian for more information. What foods should I avoid? Fruits Fruit juice. Cooked, strained fruit. Vegetables Fried potatoes. Canned vegetables. Well-cooked vegetables. Grains White bread. Pasta  made with refined flour. White rice. Meats and other proteins Fatty cuts of meat. Fried chicken or fried fish. Dairy Milk.  Yogurt. Cream cheese. Sour cream. Fats and oils Butters. Beverages Soft drinks. Other foods Cakes and pastries. The items listed above may not be a complete list of foods and beverages to avoid. Talk with your dietitian about what choices are best for you. Summary Fiber is a type of carbohydrate. It is found in foods such as fruits, vegetables, whole grains, and beans. A high-fiber diet has many benefits. It can help to prevent constipation, lower blood cholesterol, aid weight loss, and reduce your risk of heart disease, diabetes, and certain cancers. Increase your intake of fiber gradually. Increasing fiber too quickly may cause cramping, bloating, and gas. Drink plenty of water while you increase the amount of fiber you consume. The best sources of fiber include whole fruits and vegetables, whole grains, nuts, seeds, and beans. This information is not intended to replace advice given to you by your health care provider. Make sure you discuss any questions you have with your health care provider. Document Revised: 08/14/2019 Document Reviewed: 08/14/2019 Elsevier Patient Education  [place].

## 2022-05-23 NOTE — Progress Notes (Signed)
Jonathon Bellows MD, MRCP(U.K) [address]  [place]  [place], [place] [address]  Main: [phone]  Fax: [phone]   Gastroenterology Consultation  Referring Provider:     Jearld Fenton, NP Primary Care Physician:  Jearld Fenton, NP Primary Gastroenterologist:  Dr. Jonathon Bellows  Reason for Consultation:     Diverticulitis        HPI:   Stacy Cunningham is a 72 y.o. y/o female referred for consultation & management  by Jearld Fenton, NP.     On 05/12/2022 she presented to Jearld Fenton, NP with generalized abdominal pain and tendernes in LUQ and LLQ- was suspected to have diverticulitis and advised conservative management and referred to see me .  She has a chronic microcytic anemia for over 4 years that is stable with recent iron studies showing normal levels .  She had a anoscopy in 2019 by Dr. Bonna Gains that showed diverticulosis throughout the colon.  She said that the abdominal pain began about 2 weeks back was over lower abdomen associated with diarrhea no fever and gradually has been getting better has noticed some blood when she wipes.  Denies any NSAID use.  She has been suffering from constipation alternating with diarrhea and had to take a laxative to have a bowel movement.  She has been consuming regular quantities of sweet tea and possibly artificial sugars   Past Medical History:  Diagnosis Date   Anemia    Borderline diabetes mellitus    Elevated lipids    GERD (gastroesophageal reflux disease)    Glaucoma    Headache    h/o migraines   Hypertension    Neuropathy    Palpitations    a. 05/2020 Zio: Predominantly sinus rhythm, 80 (60-148).  1 SVT run-4 beats at 148.  Rare PACs and PVCs; b. 04/2021 [facility]io: Predominantly sinus rhythm, 80 (59-140).  Rare PACs/PVCs.   Panic attack     Past Surgical History:  Procedure Laterality Date   ANTERIOR CERVICAL DECOMP/DISCECTOMY FUSION N/A 09/30/2018   Procedure: ANTERIOR CERVICAL DECOMPRESSION/DISCECTOMY FUSION 1  LEVEL - C 5/6;  Surgeon: Deetta Perla, MD;  Location: [facility];  Service: Neurosurgery;  Laterality: N/A;   CATARACT EXTRACTION W/PHACO Right 02/15/2022   Procedure: CATARACT EXTRACTION PHACO AND INTRAOCULAR LENS PLACEMENT (IOC) RIGHT KAHOOK DUAL BLADE GONIOTOMY DIABETIC 6.21 01:05.9;  Surgeon: Leandrew Koyanagi, MD;  Location: [facility];  Service: Ophthalmology;  Laterality: Right;   CHOLECYSTECTOMY N/A 02/06/2018   Procedure: LAPAROSCOPIC CHOLECYSTECTOMY;  Surgeon: Herbert Pun, MD;  Location: [facility];  Service: General;  Laterality: N/A;   COLONOSCOPY WITH PROPOFOL N/A 07/13/2017   Procedure: COLONOSCOPY WITH PROPOFOL;  Surgeon: Virgel Manifold, MD;  Location: [facility];  Service: Endoscopy;  Laterality: N/A;   ESOPHAGOGASTRODUODENOSCOPY (EGD) WITH PROPOFOL N/A 09/14/2017   Procedure: ESOPHAGOGASTRODUODENOSCOPY (EGD) WITH PROPOFOL;  Surgeon: Virgel Manifold, MD;  Location: [facility];  Service: Endoscopy;  Laterality: N/A;    Prior to Admission medications   Medication Sig Start Date End Date Taking? Authorizing Provider  Accu-Chek Softclix Lancets lancets  12/15/19   [provider]  acetaminophen (TYLENOL) 500 MG tablet Take 1,000 mg by mouth 2 (two) times daily as needed for moderate pain or headache.    [provider]  aspirin 81 MG EC tablet Take 81 mg by mouth daily.    [provider]  brimonidine (ALPHAGAN) 0.15 % ophthalmic solution Place 1 drop into both eyes 3 (three) times daily. 05/06/20  [provider]  clotrimazole-betamethasone (LOTRISONE) cream Apply topically 2 (two) times daily. 05/15/22   Jearld Fenton, NP  COMBIGAN 0.2-0.5 % ophthalmic solution Apply 1 drop to eye 2 (two) times daily. 12/15/19   [provider]  cyclobenzaprine (FLEXERIL) 5 MG tablet Take by mouth.    [provider]  dorzolamide-timolol (COSOPT) 22.3-6.8 MG/ML ophthalmic solution Place 1 drop into both eyes at  bedtime. 09/07/20   [provider]  famotidine (PEPCID) 40 MG tablet Take 40 mg by mouth daily. 05/20/20   [provider]  ferrous sulfate 325 (65 FE) MG tablet Take 1 tablet (325 mg total) by mouth 2 (two) times daily with a meal. 05/09/22   Baity, Coralie Keens, NP  gabapentin (NEURONTIN) 800 MG tablet Take 800 mg by mouth 3 (three) times daily.    [provider]  hydrochlorothiazide (HYDRODIURIL) 25 MG tablet Take 25 mg by mouth daily.    [provider]  lidocaine (LIDODERM) 5 % Place 2 patches onto the skin daily as needed (pain). Remove & Discard patch within 12 hours or as directed by MD    [provider]  lisinopril (ZESTRIL) 2.5 MG tablet Take 2.5 mg by mouth daily. 08/03/20   [provider]  metFORMIN (GLUCOPHAGE) 500 MG tablet Take 500-1,000 mg by mouth See admin instructions. Take 1000 mg in the morning and 500 mg in the evening 10/21/07   [provider]  nortriptyline (PAMELOR) 10 MG capsule Take 10 mg by mouth 2 (two) times daily.    [provider]  omeprazole (PRILOSEC) 20 MG capsule Take 1 capsule (20 mg total) by mouth daily. 05/08/22   Jearld Fenton, NP  propranolol (INDERAL) 20 MG tablet Take 20 mg by mouth 2 (two) times daily. 01/23/22   [provider]  simvastatin (ZOCOR) 20 MG tablet Take 20 mg by mouth every evening.    [provider]  SUMAtriptan (IMITREX) 100 MG tablet Take by mouth. 12/20/21   [provider]  timolol (BETIMOL) 0.5 % ophthalmic solution Place 1 drop into both eyes 2 (two) times daily.    [provider]    Family History  Problem Relation Age of Onset   Breast cancer Sister 41   Heart attack Mother 17   Hyperlipidemia Mother    Hypertension Mother      Social History   Tobacco Use   Smoking status: Never   Smokeless tobacco: Never  Vaping Use   Vaping Use: Never used  Substance Use Topics   Alcohol use: Not Currently   Drug use: No     Allergies as of 05/23/2022 - Review Complete 05/23/2022  Allergen Reaction Noted   Sulfa antibiotics Dermatitis 07/27/2016   Darvon [propoxyphene] Hives 06/17/2014   Other Other (See Comments) and Rash 02/01/2018   Tape Rash 02/01/2018    Review of Systems:    All systems reviewed and negative except where noted in HPI.   Physical Exam:  BP 125/74   Pulse (!) 101   Temp 99 F (37.2 C) (Oral)   Ht '5\' 6"'$  (1.676 m)   Wt 197 lb (89.4 kg)   BMI 31.80 kg/m  No LMP recorded. Patient is postmenopausal. Psych:  Alert and cooperative. Normal mood and affect. General:   Alert,  Well-developed, well-nourished, pleasant and cooperative in NAD Head:  Normocephalic and atraumatic. Eyes:  Sclera clear, no icterus.   Conjunctiva pink. Ears:  Normal auditory acuity.masses, hepatosplenomegaly or hernias noted.  No  guarding or rebound tenderness.    Neurologic:  Alert and oriented x3;  grossly normal neurologically. Psych:  Alert and cooperative. Normal mood and affect.  Imaging Studies: US THYROID  Result Date: 05/22/2022 CLINICAL DATA:  Prior ultrasound follow-up. EXAM: THYROID ULTRASOUND TECHNIQUE: Ultrasound examination of the thyroid gland and adjacent soft tissues was performed. COMPARISON:  02/08/2021, 03/08/2021 FINDINGS: Parenchymal Echotexture: Moderately heterogeneous Isthmus: 0.3 cm ,previously 0.2 cm Right lobe: 5.4 x 1.9 x 1.9 cm ,previously 6.0 x 1.5 x 2.0 cm Left lobe: 5.1 x 3.5 x 2.9 cm ,previously 5.7 x 3.1 x 2.8 cm ________________________________________________________ Estimated total number of nodules >/= 1 cm: 4 Number of spongiform nodules >/=  2 cm not described below (TR1): 0 Number of mixed cystic and solid nodules >/= 1.5 cm not described below (TR2): 0 _________________________________________________________ Interval development of a benign-appearing (TI-RADS category 2) spongiform nodule in the right superior thyroid (labeled 1, 1.1 cm). Unchanged benign-appearing  spongiform nodule in the right mid thyroid (labeled 2, 1.6 cm, previously labeled 1, 1.6 cm). Nodule # 3 (previously labeled 2): Prior biopsy: No Location: Right; Inferior Maximum size: 1.4 cm; Other 2 dimensions: 0.9 x 0.9 cm, previously, 1.1 x 0.8 x 0.6 cm Composition: solid/almost completely solid (2) Echogenicity: hypoechoic (2) Shape: not taller-than-wide (0) Margins: ill-defined (0) Echogenic foci: none (0) ACR TI-RADS total points: 4. ACR TI-RADS risk category:  TR4 (4-6 points). Significant change in size (>/= 20% in two dimensions and minimal increase of 2 mm): Yes Change in features: No Change in ACR TI-RADS risk category: No ACR TI-RADS recommendations: *Given size (>/= 1 - 1.4 cm) and appearance, a follow-up ultrasound in 1 year should be considered based on TI-RADS criteria. _________________________________________________________ Nodule # 4 (previously labeled 3): Prior biopsy: No Location: Left; Superior Maximum size: 1.4 cm; Other 2 dimensions: 1.4 x 1.1 cm, previously, 1.4 x 1.1 x 1.0 cm Composition: solid/almost completely solid (2) Echogenicity: hypoechoic (2) Shape: not taller-than-wide (0) Margins: ill-defined (0) Echogenic foci: none (0) ACR TI-RADS total points: 4. ACR TI-RADS risk category:  TR4 (4-6 points). Significant change in size (>/= 20% in two dimensions and minimal increase of 2 mm): No Change in features: No Change in ACR TI-RADS risk category: No ACR TI-RADS recommendations: *Given size (>/= 1 - 1.4 cm) and appearance, a follow-up ultrasound in 1 year should be considered based on TI-RADS criteria. _________________________________________________________ Slight interval enlargement and partial internal cystic degeneration of previously biopsied left mid solid thyroid nodule (labeled 5, 3.4 cm, previously labeled 4, 2.9 cm). _________________________________________________________ The previously visualized left inferior solid thyroid nodule (previously labeled 5) is no longer  conspicuous. No cervical lymphadenopathy. IMPRESSION: 1. Similar appearing multinodular goiter. 2. Slight interval increased size of previously biopsied left mid solid thyroid nodule (labeled 5, 3.4 cm, previously labeled 4, 2.9 cm). Recommend correlation with prior biopsy results. 3. Similar appearance of previously visualized right inferior (labeled 3, 1.5 cm, previously labeled 2, 1.1 cm) and left superior (labeled 4, 1.4 cm, previously labeled 3, 1.4 cm) thyroid nodules. Each of these nodules again meet criteria (TI-RADS category 4) for 1 year ultrasound surveillance. This study marks ~2 years stability for both nodules. The above is in keeping with the ACR TI-RADS recommendations - J Am Coll Radiol 2017;14:587-595. Ruthann Cancer, MD [facility] Electronically Signed   By: Ruthann Cancer M.D.   On: 05/22/2022 16:29    Assessment and Plan:   Stacy Cunningham is a 72 y.o. y/o female here  to see me for a possible episode of diverticulitis.  None recently apart from this episode last colonoscopy was in 2019 she also has had some blood on the tissue paper when she wipes.  She has improved with conservative treatment and recently had some constipation for which she took some laxatives.  Plan 1.  Diagnostic colonoscopy 2.  High-fiber diet 3.  Avoid artificial sugars and sweeteners as it can precipitate an osmotic diarrhea which she is symptomatic from at this point of time   I have discussed alternative options, risks & benefits,  which include, but are not limited to, bleeding, infection, perforation,respiratory complication & drug reaction.  The patient agrees with this plan & written consent will be obtained.     Follow up in as needed  Dr Jonathon Bellows MD,MRCP(U.K)

## 2022-05-26 ENCOUNTER — Ambulatory Visit
Admission: RE | Admit: 2022-05-26 | Discharge: 2022-05-26 | Disposition: A | Discharge status: Home / Self Care | Payer: 59 | Source: Ambulatory Visit | Attending: Family Medicine | Admitting: Family Medicine

## 2022-05-26 ENCOUNTER — Other Ambulatory Visit: Payer: Self-pay

## 2022-05-26 DIAGNOSIS — M5416 Radiculopathy, lumbar region: Secondary | ICD-10-CM

## 2022-05-29 ENCOUNTER — Ambulatory Visit: Payer: Self-pay | Admitting: *Deleted

## 2022-05-29 ENCOUNTER — Ambulatory Visit: Payer: 59 | Admitting: Podiatry

## 2022-05-29 MED ORDER — DICLOFENAC SODIUM 1 % EX GEL: 4.0000 g | Freq: Four times a day (QID) | CUTANEOUS | 0 refills | Status: DC

## 2022-05-29 NOTE — Telephone Encounter (Signed)
  Chief Complaint: Calling in for her MRI result of her spine. Symptoms: N/A Frequency: N/A Pertinent Negatives: Patient denies N/A Disposition: '[]'$ ED /'[]'$ Urgent Care (no appt availability in office) / '[]'$ Appointment(In office/virtual)/ '[]'$  [facility]/ '[]'$ Home Care/ '[]'$ Refused Recommended Disposition /'[]'$ [facility]/ '[x]'$  Follow-up with PCP Additional Notes: No interpretation for the MRI of her spine noted so a message has been sent to [place] to Webb Silversmith, NP.

## 2022-05-29 NOTE — Telephone Encounter (Signed)
Reason for Disposition  [1] Caller requesting NON-URGENT health information AND [2] PCP's office is the best resource  Answer Assessment - Initial Assessment Questions 1. REASON FOR CALL or QUESTION: "What is your reason for calling today?" or "How can I best help you?" or "What question do you have that I can help answer?"     Pt called in for her MRI result of her spine.   There wasn't an interpretation available from Webb Silversmith, NP.   I let pt know I would send a message to Rollene Fare letting her know you had called regarding your MRI result.  Someone will call you back.   She also asked about a result for her stomach.   Looking in her chart she had a test pertaining to diverticulitis that was ordered by her GI dr.   I let her know she would need to call the GI dr for that result.  Protocols used: Information Only Call - No Triage-A-AH

## 2022-05-30 ENCOUNTER — Other Ambulatory Visit: Payer: Self-pay | Admitting: Student in an Organized Health Care Education/Training Program

## 2022-05-30 NOTE — Telephone Encounter (Signed)
I did not order this test.  She needs to contact the person that ordered it.

## 2022-05-31 ENCOUNTER — Other Ambulatory Visit: Payer: Self-pay | Admitting: Physician Assistant

## 2022-05-31 DIAGNOSIS — R519 Headache, unspecified: Secondary | ICD-10-CM

## 2022-06-02 ENCOUNTER — Other Ambulatory Visit: Payer: Self-pay

## 2022-06-02 ENCOUNTER — Other Ambulatory Visit: Payer: Self-pay | Admitting: Internal Medicine

## 2022-06-02 ENCOUNTER — Ambulatory Visit
Admission: RE | Admit: 2022-06-02 | Discharge: 2022-06-02 | Disposition: A | Discharge status: Home / Self Care | Payer: Self-pay | Source: Ambulatory Visit | Attending: Neurosurgery | Admitting: Neurosurgery

## 2022-06-02 DIAGNOSIS — Z049 Encounter for examination and observation for unspecified reason: Secondary | ICD-10-CM

## 2022-06-02 NOTE — Telephone Encounter (Signed)
Requested medications are due for refill today.  Unsure - all are historical  Requested medications are on the active medications list.  yes  Last refill. Unsure  Future visit scheduled.   yes  Notes to clinic.  All of these medications are historical.    Requested Prescriptions  Pending Prescriptions Disp Refills   simvastatin (ZOCOR) 20 MG tablet 30 tablet     Sig: Take 1 tablet (20 mg total) by mouth every evening.     Cardiovascular:  Antilipid - Statins Failed - 06/02/2022  4:05 PM      Failed - Lipid Panel in normal range within the last 12 months    Cholesterol  Date Value Ref Range Status  05/08/2022 133 <200 mg/dL Final   LDL Cholesterol (Calc)  Date Value Ref Range Status  05/08/2022 63 mg/dL (calc) Final    Comment:    Reference range: <100 . Desirable range <100 mg/dL for primary prevention;   <70 mg/dL for patients with CHD or diabetic patients  with > or = 2 CHD risk factors. Marland Kitchen LDL-C is now calculated using the Martin-Hopkins  calculation, which is a validated novel method providing  better accuracy than the Friedewald equation in the  estimation of LDL-C.  Cresenciano Genre et al. Annamaria Helling. [id number]): 2061-2068  (http://education.[employer].com/faq/FAQ164)    HDL  Date Value Ref Range Status  05/08/2022 54 > OR = 50 mg/dL Final   Triglycerides  Date Value Ref Range Status  05/08/2022 78 <150 mg/dL Final         Passed - Patient is not pregnant      Passed - Valid encounter within last 12 months    Recent Outpatient Visits           3 weeks ago Diverticulitis of large intestine without bleeding, unspecified complication status   [facility], [place] E, PA-C   3 weeks ago Benign essential hypertension   [facility] [place], Coralie Keens, NP       Future Appointments             In 5 months Garnette Gunner, Coralie Keens, NP [facility]             metFORMIN  (GLUCOPHAGE) 500 MG tablet      Sig: Take 1-2 tablets (500-1,000 mg total) by mouth See admin instructions. Take 1000 mg in the morning and 500 mg in the evening     Endocrinology:  Diabetes - Biguanides Failed - 06/02/2022  4:05 PM      Failed - B12 Level in normal range and within 720 days    No results found for: "VITAMINB12"       Failed - CBC within normal limits and completed in the last 12 months    WBC  Date Value Ref Range Status  05/08/2022 7.9 3.8 - 10.8 Thousand/uL Final   RBC  Date Value Ref Range Status  05/08/2022 5.07 3.80 - 5.10 Million/uL Final   Hemoglobin  Date Value Ref Range Status  05/08/2022 11.4 (L) 11.7 - 15.5 g/dL Final   HCT  Date Value Ref Range Status  05/08/2022 36.1 35.0 - 45.0 % Final   MCHC  Date Value Ref Range Status  05/08/2022 31.6 (L) 32.0 - 36.0 g/dL Final   [facility]  Date Value Ref Range Status  05/08/2022 22.5 (L) 27.0 - 33.0 pg Final   MCV  Date Value Ref Range Status  05/08/2022  71.2 (L) 80.0 - 100.0 fL Final   No results found for: "PLTCOUNTKUC", "LABPLAT", "POCPLA" RDW  Date Value Ref Range Status  05/08/2022 16.4 (H) 11.0 - 15.0 % Final         Passed - Cr in normal range and within 360 days    Creat  Date Value Ref Range Status  05/08/2022 0.74 0.50 - 1.05 mg/dL Final   Creatinine, Urine  Date Value Ref Range Status  05/08/2022 53 20 - 275 mg/dL Final         Passed - HBA1C is between 0 and 7.9 and within 180 days    Hgb A1c MFr Bld  Date Value Ref Range Status  05/08/2022 7.0 (H) <5.7 % of total Hgb Final    Comment:    For someone without known diabetes, a hemoglobin A1c value of 6.5% or greater indicates that they may have  diabetes and this should be confirmed with a follow-up  test. . For someone with known diabetes, a value <7% indicates  that their diabetes is well controlled and a value  greater than or equal to 7% indicates suboptimal  control. A1c targets should be individualized based on  duration  of diabetes, age, comorbid conditions, and  other considerations. . Currently, no consensus exists regarding use of hemoglobin A1c for diagnosis of diabetes for children. .          Passed - eGFR in normal range and within 360 days    GFR calc Af Amer  Date Value Ref Range Status  09/18/2018 >60 >60 mL/min Final   GFR calc non Af Amer  Date Value Ref Range Status  09/18/2018 >60 >60 mL/min Final   eGFR  Date Value Ref Range Status  05/08/2022 88 > OR = 60 mL/min/1.51m Final         Passed - Valid encounter within last 6 months    Recent Outpatient Visits           3 weeks ago Diverticulitis of large intestine without bleeding, unspecified complication status   [place]   3 weeks ago Benign essential [facility]       Future Appointments             In 5 months [place] [facility]             lidocaine (LIDODERM) 5 % 30 patch     Sig: Place 2 patches onto the skin daily as needed (pain). Remove & Discard patch within 12 hours or as directed by MD     Analgesics:  Topicals Failed - 06/02/2022  4:05 PM      Failed - Manual Review: Labs are only required if the patient has taken medication for more than 8 weeks.      Failed - HGB in normal range and within 360 days    Hemoglobin  Date Value Ref Range Status  05/08/2022 11.4 (L) 11.7 - 15.5 g/dL Final         Passed - PLT in normal range and within 360 days    Platelets  Date Value Ref Range Status  05/08/2022 271 140 - 400 Thousand/uL Final         Passed - HCT in normal range and within 360 days    HCT  Date Value Ref Range Status  05/08/2022  36.1 35.0 - 45.0 % Final         Passed - Cr in normal range and within 360 days    Creat  Date Value Ref Range Status  05/08/2022 0.74 0.50 - 1.05 mg/dL Final   Creatinine, Urine  Date Value Ref Range  Status  05/08/2022 53 20 - 275 mg/dL Final         Passed - eGFR is 30 or above and within 360 days    GFR calc Af Amer  Date Value Ref Range Status  09/18/2018 >60 >60 mL/min Final   GFR calc non Af Amer  Date Value Ref Range Status  09/18/2018 >60 >60 mL/min Final   eGFR  Date Value Ref Range Status  05/08/2022 88 > OR = 60 mL/min/1.86m Final         Passed - Patient is not pregnant      Passed - Valid encounter within last 12 months    Recent Outpatient Visits           3 weeks ago Diverticulitis of large intestine without bleeding, unspecified complication status   [place]   3 weeks ago Benign essential hypertension   [facility]       Future Appointments             In 5 months [place] [facility]             famotidine (PEPCID) 40 MG tablet      Sig: Take 1 tablet (40 mg total) by mouth daily.     Gastroenterology:  H2 Antagonists Passed - 06/02/2022  4:05 PM      Passed - Valid encounter within last 12 months    Recent Outpatient Visits           3 weeks ago Diverticulitis of large intestine without bleeding, unspecified complication status   [facility] [place]  3 weeks ago Benign essential hypertension   [facility]       Future Appointments             In 5 months [place] [facility]             hydrochlorothiazide (HYDRODIURIL) 25 MG tablet      Sig: Take 1 tablet (25 mg total) by mouth daily.     Cardiovascular: Diuretics - Thiazide Passed - 06/02/2022  4:05 PM      Passed - Cr in normal range and within 180 days    Creat  Date Value Ref Range Status  05/08/2022 0.74 0.50 - 1.05 mg/dL Final   Creatinine, Urine  Date Value Ref Range Status  05/08/2022 53 20  - 275 mg/dL Final         Passed - K in normal range and within 180 days    Potassium  Date Value Ref Range Status  05/08/2022 4.5 3.5 - 5.3 mmol/L Final         Passed - Na in normal range and within 180 days    Sodium  Date Value Ref Range Status  05/08/2022 141 135 - 146 mmol/L Final         Passed - Last BP in normal range    BP Readings  from Last 1 Encounters:  05/23/22 125/74         Passed - Valid encounter within last 6 months    Recent Outpatient Visits           3 weeks ago Diverticulitis of large intestine without bleeding, unspecified complication status   [place], [place]   3 weeks ago Benign essential hypertension   [facility] [place], Coralie Keens, NP       Future Appointments             In 5 months Baity, Coralie Keens, NP [facility]

## 2022-06-02 NOTE — Telephone Encounter (Signed)
Pt also requesting that the Rx for simvastatin (ZOCOR) 20 MG tablet be refilled also.

## 2022-06-02 NOTE — Telephone Encounter (Signed)
Medication Refill - Medication: metFORMIN (GLUCOPHAGE) 500 MG tablet, famotidine (PEPCID) 40 MG tablet, hydrochlorothiazide (HYDRODIURIL) 25 MG tablet, and lidocaine (LIDODERM) 5 %   Has the patient contacted their pharmacy? Yes.   Pt told to contact provider - She is a new pt of the practice  Preferred Pharmacy (with phone number or street name):  [place], [place] Phone: [phone]  Fax: [phone]     Has the patient been seen for an appointment in the last year OR does the patient have an upcoming appointment? Yes.    Agent: Please be advised that RX refills may take up to 3 business days. We ask that you follow-up with your pharmacy.

## 2022-06-05 ENCOUNTER — Telehealth: Payer: Self-pay

## 2022-06-05 MED ORDER — METFORMIN HCL 500 MG PO TABS: 500.0000 mg | ORAL_TABLET | Freq: Two times a day (BID) | ORAL | 0 refills | Status: DC

## 2022-06-05 MED ORDER — HYDROCHLOROTHIAZIDE 25 MG PO TABS: 25.0000 mg | ORAL_TABLET | Freq: Every day | ORAL | 0 refills | Status: DC

## 2022-06-05 MED ORDER — SIMVASTATIN 20 MG PO TABS: 20.0000 mg | ORAL_TABLET | Freq: Every evening | ORAL | 0 refills | Status: DC

## 2022-06-05 MED ORDER — LIDOCAINE 5 % EX PTCH: 2.0000 | MEDICATED_PATCH | Freq: Every day | CUTANEOUS | 0 refills | Status: DC | PRN

## 2022-06-05 MED ORDER — FAMOTIDINE 40 MG PO TABS: 40.0000 mg | ORAL_TABLET | Freq: Every day | ORAL | 0 refills | Status: DC

## 2022-06-05 NOTE — Progress Notes (Unsigned)
Referring Physician:  Jearld Fenton, NP [address] [place],  [place] [address]  Primary Physician:  Jearld Fenton, NP  History of Present Illness: 06/06/2022 Stacy Cunningham is here today with a chief complaint of bilateral low back pain with radiation to the bilateral buttock, posterior lateral thighs and calves along with numbness, tingling, burning, pain in bilateral legs and feet.  He has had symptoms for many years, but worsening over the past 1 to 2 years.  She reports numbness and tingling in her legs with sharp pain at times.  Standing and walking make it worse.  Rest, sitting, and leaning over grocery cart make it better.  She denies weakness or bowel or bladder issues.  EMG 01/05/20 by Dr Melrose Nakayama    Conservative measures:  Physical therapy: has not participated in but has been referred to [facility] and has her initial evaluation on 06/13/22.  Multimodal medical therapy including regular antiinflammatories:  tylenol, cyclobenzaprine, gabapentin, ibuprofen, lidocaine patches, lyrica, baclofen Injections: has received epidural steroid injections 03/03/2021: Bilateral L4-5 transforaminal ESI (about 6 weeks of good relief) 07/15/2020: Bilateral L4-5 transforaminal ESI (good relief) 06/04/2020: Right hip joint injection performed by Dr. Candelaria Stagers (no relief)  02/12/2020: Bilateral S1 transforaminal ESI (little relief)  08/12/2019: Bilateral S1 transforaminal ESI (good relief)  06/18/2019: Bilateral S1 transforaminal ESI (3 days moderate relief the return of symptoms)    Past Surgery: C5-6 ACDF by Dr Lacinda Axon in [place] has no symptoms of cervical myelopathy.  The symptoms are causing a significant impact on the patient's life.   I have utilized the care everywhere function in epic to review the outside records available from external health systems.  Review of Systems:  A 10 point review of systems is negative, except for the pertinent positives and negatives  detailed in the HPI.  Past Medical History: Past Medical History:  Diagnosis Date   Anemia    Borderline diabetes mellitus    Elevated lipids    GERD (gastroesophageal reflux disease)    Glaucoma    Headache    h/o migraines   Hypertension    Neuropathy    Palpitations    a. 05/2020 Zio: Predominantly sinus rhythm, 80 (60-148).  1 SVT run-4 beats at 148.  Rare PACs and PVCs; b. 04/2021 Zio: Predominantly sinus rhythm, 80 (59-140).  Rare PACs/PVCs.   Panic attack     Past Surgical History: Past Surgical History:  Procedure Laterality Date   ANTERIOR CERVICAL DECOMP/DISCECTOMY FUSION N/A 09/30/2018   Procedure: ANTERIOR CERVICAL DECOMPRESSION/DISCECTOMY FUSION 1 LEVEL - C 5/6;  Surgeon: Deetta Perla, MD;  Location: [facility] ORS;  Service: Neurosurgery;  Laterality: N/A;   CATARACT EXTRACTION W/PHACO Right 02/15/2022   Procedure: CATARACT EXTRACTION PHACO AND INTRAOCULAR LENS PLACEMENT (IOC) RIGHT KAHOOK DUAL BLADE GONIOTOMY DIABETIC 6.21 01:05.9;  Surgeon: Leandrew Koyanagi, MD;  Location: [facility];  Service: Ophthalmology;  Laterality: Right;   CHOLECYSTECTOMY N/A 02/06/2018   Procedure: LAPAROSCOPIC CHOLECYSTECTOMY;  Surgeon: Herbert Pun, MD;  Location: [facility];  Service: General;  Laterality: N/A;   COLONOSCOPY WITH PROPOFOL N/A 07/13/2017   Procedure: COLONOSCOPY WITH PROPOFOL;  Surgeon: Virgel Manifold, MD;  Location: [facility];  Service: Endoscopy;  Laterality: N/A;   ESOPHAGOGASTRODUODENOSCOPY (EGD) WITH PROPOFOL N/A 09/14/2017   Procedure: ESOPHAGOGASTRODUODENOSCOPY (EGD) WITH PROPOFOL;  Surgeon: Virgel Manifold, MD;  Location: [facility] ENDOS[facility];  Service: Endoscopy;  Laterality: N/A;    Allergies: Allergies as of 06/06/2022 - Review Complete 06/06/2022  Allergen  Reaction Noted   Sulfa antibiotics Dermatitis 07/27/2016   Darvon [propoxyphene] Hives 06/17/2014   Other Other (See Comments) and Rash 02/01/2018   Tape Rash 02/01/2018     Medications: Current Meds  Medication Sig   Accu-Chek Softclix Lancets lancets    acetaminophen (TYLENOL) 500 MG tablet Take 1,000 mg by mouth 2 (two) times daily as needed for moderate pain or headache.   aspirin 81 MG EC tablet Take 81 mg by mouth daily.   brimonidine (ALPHAGAN) 0.15 % ophthalmic solution Place 1 drop into both eyes 3 (three) times daily.   carbamazepine (TEGRETOL) 100 MG chewable tablet Chew 100 mg by mouth.   clotrimazole-betamethasone (LOTRISONE) cream Apply topically 2 (two) times daily.   COMBIGAN 0.2-0.5 % ophthalmic solution Apply 1 drop to eye 2 (two) times daily.   cyclobenzaprine (FLEXERIL) 5 MG tablet Take by mouth.   diazepam (VALIUM) 5 MG tablet Take 5 mg by mouth.   diclofenac Sodium (VOLTAREN) 1 % GEL Apply 4 g topically 4 (four) times daily.   dorzolamide-timolol (COSOPT) 22.3-6.8 MG/ML ophthalmic solution Place 1 drop into both eyes at bedtime.   famotidine (PEPCID) 40 MG tablet Take 1 tablet (40 mg total) by mouth daily.   ferrous sulfate 325 (65 FE) MG tablet Take 1 tablet (325 mg total) by mouth 2 (two) times daily with a meal.   gabapentin (NEURONTIN) 800 MG tablet Take 800 mg by mouth 3 (three) times daily.   hydrochlorothiazide (HYDRODIURIL) 25 MG tablet Take 1 tablet (25 mg total) by mouth daily.   ibuprofen (ADVIL) 800 MG tablet Take 800 mg by mouth every 8 (eight) hours as needed.   lidocaine (LIDODERM) 5 % Place 2 patches onto the skin daily as needed (pain). Remove & Discard patch within 12 hours or as directed by MD   lisinopril (ZESTRIL) 2.5 MG tablet Take 2.5 mg by mouth daily.   metFORMIN (GLUCOPHAGE) 500 MG tablet Take 2 tablets (1,000 mg total) by mouth daily with breakfast. And 543m in the evening   Multiple Vitamin (MULTIVITAMIN ADULT PO) Take by mouth.   nortriptyline (PAMELOR) 10 MG capsule Take 10 mg by mouth 2 (two) times daily.   omeprazole (PRILOSEC) 20 MG capsule Take 1 capsule (20 mg total) by mouth daily.   propranolol  (INDERAL) 20 MG tablet Take 20 mg by mouth 2 (two) times daily.   simvastatin (ZOCOR) 20 MG tablet Take 1 tablet (20 mg total) by mouth every evening.   SUMAtriptan (IMITREX) 100 MG tablet Take by mouth.   timolol (BETIMOL) 0.5 % ophthalmic solution Place 1 drop into both eyes 2 (two) times daily.   VIGAMOX 0.5 % ophthalmic solution Place 1 drop into the right eye 4 (four) times daily.    Social History: Social History   Tobacco Use   Smoking status: Never   Smokeless tobacco: Never  Vaping Use   Vaping Use: Never used  Substance Use Topics   Alcohol use: Not Currently   Drug use: No    Family Medical History: Family History  Problem Relation Age of Onset   Breast cancer Sister 568  Heart attack Mother 872  Hyperlipidemia Mother    Hypertension Mother     Physical Examination: Vitals:   06/06/22 1452  BP: 124/80    General: Patient is well developed, well nourished, calm, collected, and in no apparent distress. Attention to examination is appropriate.  Neck:   Supple.  Full range of motion.  Respiratory: Patient is breathing  without any difficulty.   NEUROLOGICAL:     Awake, alert, oriented to person, place, and time.  Speech is clear and fluent.   Cranial Nerves: Pupils equal round and reactive to light.  Facial tone is symmetric.  Facial sensation is symmetric. Shoulder shrug is symmetric. Tongue protrusion is midline.  There is no pronator drift.  ROM of spine: full.    Strength: Side Biceps Triceps Deltoid Interossei Grip Wrist Ext. Wrist Flex.  R 5 5 5 5 5 5 5  $ L 5 5 5 5 5 5 5   $ Side Iliopsoas Quads Hamstring PF DF EHL  R 5 5 5 5 5 5  $ L 5 5 5 5 5 5   $ Reflexes are 1+ and symmetric at the biceps, triceps, brachioradialis, patella and achilles.   Hoffman's is absent.   Bilateral upper and lower extremity sensation is intact to light touch.    No evidence of dysmetria noted.  Gait is normal.     Medical Decision Making  Imaging: MRI L spine  05/26/22 IMPRESSION: 1. Overall stable multilevel disc disease and facet disease. 2. Stable moderate spinal and moderately severe bilateral lateral recess stenosis at L2-3. 3. Stable severe spinal and bilateral lateral recess stenosis at L3-4. 4. Stable moderately severe spinal and bilateral lateral recess stenosis at L4-5. 5. Persistent moderate-sized central disc protrusion at L5-S1 with mass effect on the ventral thecal sac and potential irritation of both S1 nerve roots.     Electronically Signed   By: Marijo Sanes M.D.   On: 05/27/2022 19:18  I have personally reviewed the images and agree with the above interpretation.  Assessment and Plan: Stacy Cunningham is a pleasant 72 y.o. female with multilevel lumbar stenosis causing neurogenic claudication.  She has tried injections without impact.  She is starting physical therapy next week.  We will see her back in clinic after she tries a course of physical therapy to see if this will lead to some improvement.  If her symptoms improve, we will continue to manage expectantly.  If her symptoms do not improve, we will discuss L2-S1 decompression to alleviate the compression of her nerve roots.  I spent a total of 30 minutes in this patient's care today. This time was spent reviewing pertinent records including imaging studies, obtaining and confirming history, performing a directed evaluation, formulating and discussing my recommendations, and documenting the visit within the medical record.    Thank you for involving me in the care of this patient.      Stacy Cunningham K. Izora Ribas MD, [facility] Neurosurgery

## 2022-06-05 NOTE — Telephone Encounter (Signed)
Received call from [place]. She needs clarification on metformin order.   Sig from Rx states:  Sig: Take 1 tablet (500 mg total) by mouth 2 (two) times daily with a meal. Take 1000 mg in the morning and 500 mg in the evening.  Please review and advise pharmacy.

## 2022-06-06 ENCOUNTER — Ambulatory Visit (INDEPENDENT_AMBULATORY_CARE_PROVIDER_SITE_OTHER): Payer: 59 | Admitting: Neurosurgery

## 2022-06-06 ENCOUNTER — Encounter: Payer: Self-pay | Admitting: Neurosurgery

## 2022-06-06 VITALS — BP 124/80 | Ht 66.0 in | Wt 197.6 lb

## 2022-06-06 DIAGNOSIS — M48062 Spinal stenosis, lumbar region with neurogenic claudication: Secondary | ICD-10-CM | POA: Diagnosis not present

## 2022-06-06 MED ORDER — METFORMIN HCL 500 MG PO TABS: 1000.0000 mg | ORAL_TABLET | Freq: Every day | ORAL | 0 refills | Status: DC

## 2022-06-06 MED ORDER — TIZANIDINE HCL 4 MG PO TABS: 4.0000 mg | ORAL_TABLET | Freq: Three times a day (TID) | ORAL | 1 refills | Status: AC | PRN

## 2022-06-06 NOTE — Addendum Note (Signed)
Addended by: Ashley Royalty E on: 06/06/2022 11:37 AM   Modules accepted: Orders

## 2022-06-06 NOTE — Addendum Note (Signed)
Addended by: Ashley Royalty E on: 06/06/2022 11:40 AM   Modules accepted: Orders

## 2022-06-06 NOTE — Addendum Note (Signed)
Addended by: Meade Maw on: 06/06/2022 03:24 PM   Modules accepted: Orders

## 2022-06-06 NOTE — Telephone Encounter (Signed)
6 should state take 1000 mg in a.m. and 500 mg in p.m.

## 2022-06-06 NOTE — Telephone Encounter (Addendum)
I have sent the updated RX to pt's pharmacy.   Disp Refills Start End   metFORMIN (GLUCOPHAGE) 500 MG tablet 270 tablet 0 06/06/2022    Sig - Route: Take 2 tablets (1,000 mg total) by mouth daily with breakfast. And 573m in the evening - Oral   Sent to pharmacy as: metFORMIN (GLUCOPHAGE) 500 MG tablet   E-Prescribing Status: Receipt confirmed by pharmacy (06/06/2022 11:39 AM EST)     Thanks,   -[name]

## 2022-06-08 ENCOUNTER — Ambulatory Visit: Payer: Self-pay

## 2022-06-08 ENCOUNTER — Telehealth: Payer: Self-pay

## 2022-06-08 NOTE — Telephone Encounter (Signed)
I can not get her in any sooner than that. Usually dermatology is 4-6 months out

## 2022-06-08 NOTE — Telephone Encounter (Signed)
Pt advised.  She is going to call around to see if she can get in somewhere else sooner.   Thank you,   -[name]

## 2022-06-08 NOTE — Telephone Encounter (Signed)
Copied from [place]. Topic: Referral - Request for Referral >> Jun 08, 2022  8:19 AM Oley Balm A wrote: Has patient seen PCP for this complaint? No. *If NO, is insurance requiring patient see PCP for this issue before PCP can refer them? Referral for which specialty: Dermatology  Preferred provider/office: Pt is wanting PCP to give her the referral in [place]. Pt states that she called [facility] and they told her they would not have an appt until May and that is too long. Reason for referral: Pt states that she has a knot on her left ear lobe and it is getting irritating.

## 2022-06-08 NOTE — Telephone Encounter (Signed)
  Chief Complaint: Migraine HA Symptoms: above Frequency: unknown Pertinent Negatives: Patient denies  Disposition: '[]'$ ED /'[]'$ Urgent Care (no appt availability in office) / '[]'$ Appointment(In office/virtual)/ '[]'$  [facility]/ '[]'$ Home Care/ '[]'$ Refused Recommended Disposition /'[]'$ [place] [facility]/ '[]'$  Follow-up with PCP Additional Notes: Pt called regarding migraine HA medication. Pt is seeing a neurologist for HA. PT referred back to neurologist. Pt will call back if needed.    Answer Assessment - Initial Assessment Questions 1. REASON FOR CALL or QUESTION: "What is your reason for calling today?" or "How can I best help you?" or "What question do you have that I can help answer?"     Pt states she is having HA.  Protocols used: Information Only Call - No Triage-A-AH

## 2022-06-13 ENCOUNTER — Telehealth: Payer: Self-pay

## 2022-06-13 NOTE — Telephone Encounter (Signed)
Copied from [place] [id number]. Topic: Referral - Request for Referral >> Jun 13, 2022  9:16 AM Erskine Squibb wrote: Has patient seen PCP for this complaint? No. Referral for which specialty: Dermatology Preferred provider/office: [facility] Reason for referral: Lump on ear that is irritating her  The patient scheduled her own appt which is unfortunately for tomorrow not realizing that it requires a referral. She has not discussed this with her provider and does not have an appt until July. She states it is very hard to get into this dermatologist and the next appt available is not until the summer. Please assist patient further. >> Jun 13, 2022  9:45 AM Erskine Squibb wrote: The patient called back to [facility] and they told her since she has a secondary insurance she does not need a referral after all so please disregard previous request for referral.

## 2022-06-16 ENCOUNTER — Encounter: Payer: Self-pay | Admitting: Gastroenterology

## 2022-06-19 ENCOUNTER — Ambulatory Visit: Payer: 59 | Admitting: Anesthesiology

## 2022-06-19 ENCOUNTER — Ambulatory Visit
Admission: RE | Admit: 2022-06-19 | Discharge: 2022-06-19 | Disposition: A | Discharge status: Home / Self Care | Payer: 59 | Attending: Gastroenterology | Admitting: Gastroenterology

## 2022-06-19 ENCOUNTER — Encounter: Payer: Self-pay | Admitting: Gastroenterology

## 2022-06-19 ENCOUNTER — Ambulatory Visit: Payer: 59 | Admitting: Podiatry

## 2022-06-19 ENCOUNTER — Encounter
Admission: RE | Disposition: A | Discharge status: Home / Self Care | Payer: Self-pay | Source: Home / Self Care | Attending: Gastroenterology

## 2022-06-19 DIAGNOSIS — K219 Gastro-esophageal reflux disease without esophagitis: Secondary | ICD-10-CM | POA: Diagnosis not present

## 2022-06-19 DIAGNOSIS — Z8719 Personal history of other diseases of the digestive system: Secondary | ICD-10-CM | POA: Diagnosis not present

## 2022-06-19 DIAGNOSIS — K5790 Diverticulosis of intestine, part unspecified, without perforation or abscess without bleeding: Secondary | ICD-10-CM | POA: Insufficient documentation

## 2022-06-19 DIAGNOSIS — Z6832 Body mass index (BMI) 32.0-32.9, adult: Secondary | ICD-10-CM | POA: Insufficient documentation

## 2022-06-19 DIAGNOSIS — Z09 Encounter for follow-up examination after completed treatment for conditions other than malignant neoplasm: Secondary | ICD-10-CM | POA: Insufficient documentation

## 2022-06-19 DIAGNOSIS — Z7984 Long term (current) use of oral hypoglycemic drugs: Secondary | ICD-10-CM | POA: Diagnosis not present

## 2022-06-19 DIAGNOSIS — K640 First degree hemorrhoids: Secondary | ICD-10-CM | POA: Diagnosis not present

## 2022-06-19 DIAGNOSIS — E1140 Type 2 diabetes mellitus with diabetic neuropathy, unspecified: Secondary | ICD-10-CM | POA: Diagnosis not present

## 2022-06-19 DIAGNOSIS — F419 Anxiety disorder, unspecified: Secondary | ICD-10-CM | POA: Diagnosis not present

## 2022-06-19 DIAGNOSIS — G43909 Migraine, unspecified, not intractable, without status migrainosus: Secondary | ICD-10-CM | POA: Diagnosis not present

## 2022-06-19 DIAGNOSIS — I10 Essential (primary) hypertension: Secondary | ICD-10-CM | POA: Insufficient documentation

## 2022-06-19 DIAGNOSIS — D649 Anemia, unspecified: Secondary | ICD-10-CM | POA: Insufficient documentation

## 2022-06-19 DIAGNOSIS — E669 Obesity, unspecified: Secondary | ICD-10-CM | POA: Insufficient documentation

## 2022-06-19 HISTORY — PX: COLONOSCOPY WITH PROPOFOL: SHX5780

## 2022-06-19 LAB — GLUCOSE, CAPILLARY: Glucose-Capillary: 145 mg/dL — ABNORMAL HIGH (ref 70–99)

## 2022-06-19 SURGERY — COLONOSCOPY WITH PROPOFOL
Anesthesia: General

## 2022-06-19 MED ORDER — PROPOFOL 500 MG/50ML IV EMUL
INTRAVENOUS | Status: DC | PRN
  Administered 2022-06-19: 150 ug/kg/min via INTRAVENOUS

## 2022-06-19 MED ORDER — SODIUM CHLORIDE 0.9 % IV SOLN: INTRAVENOUS | Status: DC

## 2022-06-19 MED ORDER — PROPOFOL 1000 MG/100ML IV EMUL
INTRAVENOUS | Status: AC
  Filled 2022-06-19: qty 100

## 2022-06-19 MED ORDER — PROPOFOL 10 MG/ML IV BOLUS
INTRAVENOUS | Status: DC | PRN
  Administered 2022-06-19: 50 mg via INTRAVENOUS

## 2022-06-19 MED ORDER — LIDOCAINE HCL (CARDIAC) PF 100 MG/5ML IV SOSY
PREFILLED_SYRINGE | INTRAVENOUS | Status: DC | PRN
  Administered 2022-06-19: 50 mg via INTRAVENOUS

## 2022-06-19 MED ORDER — LIDOCAINE HCL (PF) 2 % IJ SOLN
INTRAMUSCULAR | Status: AC
  Filled 2022-06-19: qty 5

## 2022-06-19 NOTE — Anesthesia Postprocedure Evaluation (Signed)
Anesthesia Post Note  Patient: Stacy Cunningham  Procedure(s) Performed: COLONOSCOPY WITH PROPOFOL  Patient location during evaluation: PACU Anesthesia Type: General Level of consciousness: awake and alert, oriented and patient cooperative Pain management: pain level controlled Vital Signs Assessment: post-procedure vital signs reviewed and stable Respiratory status: spontaneous breathing, nonlabored ventilation and respiratory function stable Cardiovascular status: blood pressure returned to baseline and stable Postop Assessment: adequate PO intake Anesthetic complications: no   No notable events documented.   Last Vitals:  Vitals:   06/19/22 0923 06/19/22 0933  BP: 114/77 119/81  Pulse: 79 75  Resp: 20 13  Temp:    SpO2: 99% 100%    Last Pain:  Vitals:   06/19/22 0933  TempSrc:   PainSc: 0-No pain                 Darrin Nipper

## 2022-06-19 NOTE — Anesthesia Preprocedure Evaluation (Addendum)
Anesthesia Evaluation  Patient identified by MRN, date of birth, ID band Patient awake    Reviewed: Allergy & Precautions, NPO status , Patient's Chart, lab work & pertinent test results  History of Anesthesia Complications Negative for: history of anesthetic complications  Airway Mallampati: I   Neck ROM: Full    Dental  (+) Missing   Pulmonary neg pulmonary ROS   Pulmonary exam normal breath sounds clear to auscultation       Cardiovascular hypertension, Normal cardiovascular exam Rhythm:Regular Rate:Normal     Neuro/Psych  Headaches PSYCHIATRIC DISORDERS Anxiety      Neuromuscular disease (neuropathy)    GI/Hepatic ,GERD  ,,(+) Hepatitis -, C  Endo/Other  diabetes, Type 2  Obesity   Renal/GU negative Renal ROS     Musculoskeletal   Abdominal   Peds  Hematology  (+) Blood dyscrasia, anemia   Anesthesia Other Findings   Reproductive/Obstetrics                             Anesthesia Physical Anesthesia Plan  ASA: 3  Anesthesia Plan: General   Post-op Pain Management:    Induction: Intravenous  PONV Risk Score and Plan: 3 and Propofol infusion, TIVA and Treatment may vary due to age or medical condition  Airway Management Planned: Natural Airway  Additional Equipment:   Intra-op Plan:   Post-operative Plan:   Informed Consent: I have reviewed the patients History and Physical, chart, labs and discussed the procedure including the risks, benefits and alternatives for the proposed anesthesia with the patient or authorized representative who has indicated his/her understanding and acceptance.       Plan Discussed with: CRNA  Anesthesia Plan Comments: (LMA/GETA backup discussed.  Patient consented for risks of anesthesia including but not limited to:  - adverse reactions to medications - damage to eyes, teeth, lips or other oral mucosa - nerve damage due to positioning  -  sore throat or hoarseness - damage to heart, brain, nerves, lungs, other parts of body or loss of life  Informed patient about role of CRNA in peri- and intra-operative care.  Patient voiced understanding.)        Anesthesia Quick Evaluation

## 2022-06-19 NOTE — Telephone Encounter (Signed)
Patient has colonoscopy today- she was advised: No abnormality found- repeat in 10 years She wants to make sure she understood- normal finding. Patient advised yes- all normal- nothing found that was abnormal-will alert PCP for review

## 2022-06-19 NOTE — H&P (Signed)
Jonathon Bellows, MD [address], [place], [place], [place], [address] [address], [place], [place], [place], [address] Phone: [phone]  Fax: [phone]  Primary Care Physician:  Jearld Fenton, NP   Pre-Procedure History & Physical: HPI:  Stacy Cunningham is a 72 y.o. female is here for an colonoscopy.   Past Medical History:  Diagnosis Date   Anemia    Borderline diabetes mellitus    Diabetes mellitus without complication (HCC)    Elevated lipids    GERD (gastroesophageal reflux disease)    Glaucoma    Headache    h/o migraines   Hypertension    Neuropathy    Palpitations    a. 05/2020 Zio: Predominantly sinus rhythm, 80 (60-148).  1 SVT run-4 beats at 148.  Rare PACs and PVCs; b. 04/2021 Zio: Predominantly sinus rhythm, 80 (59-140).  Rare PACs/PVCs.   Panic attack     Past Surgical History:  Procedure Laterality Date   ANTERIOR CERVICAL DECOMP/DISCECTOMY FUSION N/A 09/30/2018   Procedure: ANTERIOR CERVICAL DECOMPRESSION/DISCECTOMY FUSION 1 LEVEL - C 5/6;  Surgeon: Deetta Perla, MD;  Location: [facility];  Service: Neurosurgery;  Laterality: N/A;   CATARACT EXTRACTION W/PHACO Right 02/15/2022   Procedure: CATARACT EXTRACTION PHACO AND INTRAOCULAR LENS PLACEMENT (IOC) RIGHT KAHOOK DUAL BLADE GONIOTOMY DIABETIC 6.21 01:05.9;  Surgeon: Leandrew Koyanagi, MD;  Location: [facility];  Service: Ophthalmology;  Laterality: Right;   CHOLECYSTECTOMY N/A 02/06/2018   Procedure: LAPAROSCOPIC CHOLECYSTECTOMY;  Surgeon: Herbert Pun, MD;  Location: [facility];  Service: General;  Laterality: N/A;   COLONOSCOPY WITH PROPOFOL N/A 07/13/2017   Procedure: COLONOSCOPY WITH PROPOFOL;  Surgeon: Virgel Manifold, MD;  Location: [facility];  Service: Endoscopy;  Laterality: N/A;   ESOPHAGOGASTRODUODENOSCOPY (EGD) WITH PROPOFOL N/A 09/14/2017   Procedure: ESOPHAGOGASTRODUODENOSCOPY (EGD) WITH PROPOFOL;  Surgeon: Virgel Manifold, MD;  Location: [facility];   Service: Endoscopy;  Laterality: N/A;    Prior to Admission medications   Medication Sig Start Date End Date Taking? Authorizing Provider  aspirin 81 MG EC tablet Take 81 mg by mouth daily.   Yes [provider]  brimonidine (ALPHAGAN) 0.15 % ophthalmic solution Place 1 drop into both eyes 3 (three) times daily. 05/06/20  Yes [provider]  COMBIGAN 0.2-0.5 % ophthalmic solution Apply 1 drop to eye 2 (two) times daily. 12/15/19  Yes [provider]  dorzolamide-timolol (COSOPT) 22.3-6.8 MG/ML ophthalmic solution Place 1 drop into both eyes at bedtime. 09/07/20  Yes [provider]  famotidine (PEPCID) 40 MG tablet Take 1 tablet (40 mg total) by mouth daily. 06/05/22  Yes Jearld Fenton, NP  gabapentin (NEURONTIN) 800 MG tablet Take 800 mg by mouth 3 (three) times daily.   Yes [provider]  hydrochlorothiazide (HYDRODIURIL) 25 MG tablet Take 1 tablet (25 mg total) by mouth daily. 06/05/22  Yes Baity, Coralie Keens, NP  lisinopril (ZESTRIL) 2.5 MG tablet Take 2.5 mg by mouth daily. 08/03/20  Yes [provider]  metFORMIN (GLUCOPHAGE) 500 MG tablet Take 2 tablets (1,000 mg total) by mouth daily with breakfast. And '500mg'$  in the evening 06/06/22  Yes Baity, Coralie Keens, NP  nortriptyline (PAMELOR) 10 MG capsule Take 10 mg by mouth 2 (two) times daily.   Yes [provider]  propranolol (INDERAL) 20 MG tablet Take 20 mg by mouth 2 (two) times daily. 01/23/22  Yes [provider]  simvastatin (ZOCOR) 20 MG tablet Take 1 tablet (20 mg total) by mouth every  evening. 06/05/22  Yes Baity, Coralie Keens, NP  timolol (BETIMOL) 0.5 % ophthalmic solution Place 1 drop into both eyes 2 (two) times daily.   Yes [provider]  Accu-Chek Softclix Lancets lancets  12/15/19   [provider]  acetaminophen (TYLENOL) 500 MG tablet Take 1,000 mg by mouth 2 (two) times daily as needed for moderate pain or headache.    [provider]   carbamazepine (TEGRETOL) 100 MG chewable tablet Chew 100 mg by mouth. Patient not taking: Reported on 06/19/2022 10/21/07   [provider]  clotrimazole-betamethasone (LOTRISONE) cream Apply topically 2 (two) times daily. 05/15/22   Jearld Fenton, NP  diazepam (VALIUM) 5 MG tablet Take 5 mg by mouth. 05/19/22   [provider]  diclofenac Sodium (VOLTAREN) 1 % GEL Apply 4 g topically 4 (four) times daily. 05/29/22   Jearld Fenton, NP  ferrous sulfate 325 (65 FE) MG tablet Take 1 tablet (325 mg total) by mouth 2 (two) times daily with a meal. 05/09/22   Baity, Coralie Keens, NP  ibuprofen (ADVIL) 800 MG tablet Take 800 mg by mouth every 8 (eight) hours as needed. 01/17/17   [provider]  lidocaine (LIDODERM) 5 % Place 2 patches onto the skin daily as needed (pain). Remove & Discard patch within 12 hours or as directed by MD 06/05/22   Jearld Fenton, NP  Multiple Vitamin (MULTIVITAMIN ADULT PO) Take by mouth. 07/20/05   [provider]  omeprazole (PRILOSEC) 20 MG capsule Take 1 capsule (20 mg total) by mouth daily. 05/08/22   Jearld Fenton, NP  SUMAtriptan (IMITREX) 100 MG tablet Take by mouth. 12/20/21   [provider]  tiZANidine (ZANAFLEX) 4 MG tablet Take 1 tablet (4 mg total) by mouth every 8 (eight) hours as needed for muscle spasms. 06/06/22 09/04/22  Meade Maw, MD  VIGAMOX 0.5 % ophthalmic solution Place 1 drop into the right eye 4 (four) times daily. 01/24/22   [provider]    Allergies as of 05/23/2022 - Review Complete 05/23/2022  Allergen Reaction Noted   Sulfa antibiotics Dermatitis 07/27/2016   Darvon [propoxyphene] Hives 06/17/2014   Other Other (See Comments) and Rash 02/01/2018   Tape Rash 02/01/2018    Family History  Problem Relation Age of Onset   Breast cancer Sister 28   Heart attack Mother 43   Hyperlipidemia Mother    Hypertension Mother     Social History   Socioeconomic History   Marital status:  Married    Spouse name: Not on file   Number of children: Not on file   Years of education: Not on file   Highest education level: Not on file  Occupational History   Not on file  Tobacco Use   Smoking status: Never   Smokeless tobacco: Never  Vaping Use   Vaping Use: Never used  Substance and Sexual Activity   Alcohol use: Not Currently    Comment: rare   Drug use: No   Sexual activity: Yes  Other Topics Concern   Not on file  Social History Narrative   Not on file   Social Determinants of Health   Financial Resource Strain: Not on file  Food Insecurity: Not on file  Transportation Needs: Not on file  Physical Activity: Not on file  Stress: Not on file  Social Connections: Not on file  Intimate Partner Violence: Not on file    Review of Systems: See HPI, otherwise negative ROS  Physical Exam: Ht '5\' 6"'$  (1.676 m)   Wt 90.3 kg   BMI 32.12 kg/m  General:   Alert,  pleasant and cooperative in NAD Head:  Normocephalic and atraumatic. Neck:  Supple; no masses or thyromegaly. Lungs:  Clear throughout to auscultation, normal respiratory effort.    Heart:  +S1, +S2, Regular rate and rhythm, No edema. Abdomen:  Soft, nontender and nondistended. Normal bowel sounds, without guarding, and without rebound.   Neurologic:  Alert and  oriented x4;  grossly normal neurologically.  Impression/Plan: Paige Yamasaki is here for an colonoscopy to be performed for diverticulitis. Risks, benefits, limitations, and alternatives regarding  colonoscopy have been reviewed with the patient.  Questions have been answered.  All parties agreeable.   Jonathon Bellows, MD  06/19/2022, 8:37 AM

## 2022-06-19 NOTE — Transfer of Care (Signed)
Immediate Anesthesia Transfer of Care Note  Patient: Stacy Cunningham  Procedure(s) Performed: COLONOSCOPY WITH PROPOFOL  Patient Location: PACU and Endoscopy Unit  Anesthesia Type:General  Level of Consciousness: awake, drowsy, and patient cooperative  Airway & Oxygen Therapy: Patient Spontanous Breathing  Post-op Assessment: Report given to RN and Post -op Vital signs reviewed and stable  Post vital signs: Reviewed and stable  Last Vitals:  Vitals Value Taken Time  BP 92/57 06/19/22 0913  Temp 35.7 C 06/19/22 0913  Pulse 79 06/19/22 0914  Resp 11 06/19/22 0914  SpO2 100 % 06/19/22 0914  Vitals shown include unvalidated device data.  Last Pain:  Vitals:   06/19/22 0913  TempSrc: Tympanic  PainSc: Asleep         Complications: No notable events documented.

## 2022-06-19 NOTE — Op Note (Signed)
[facility] Gastroenterology Patient Name: Stacy Cunningham Procedure Date: 06/19/2022 8:39 AM MRN: [id number] Account #: [id number] Date of Birth: March 18, 1951 Admit Type: Outpatient Age: 72 Room: [facility] ENDO ROOM 1 Gender: Female Note Status: Finalized Instrument Name: Colonoscope [id number] Procedure:             Colonoscopy Indications:           Follow-up of diverticula Providers:             Jonathon Bellows MD, MD Referring MD:          Jearld Fenton (Referring MD) Medicines:             Monitored Anesthesia Care Complications:         No immediate complications. Procedure:             Pre-Anesthesia Assessment:                        - Prior to the procedure, a History and Physical was                         performed, and patient medications, allergies and                         sensitivities were reviewed. The patient's tolerance                         of previous anesthesia was reviewed.                        - The risks and benefits of the procedure and the                         sedation options and risks were discussed with the                         patient. All questions were answered and informed                         consent was obtained.                        - ASA Grade Assessment: II - A patient with mild                         systemic disease.                        After obtaining informed consent, the colonoscope was                         passed under direct vision. Throughout the procedure,                         the patient's blood pressure, pulse, and oxygen                         saturations were monitored continuously. The                         Colonoscope was introduced through the  anus and                         advanced to the the cecum, identified by the                         appendiceal orifice. The colonoscopy was performed                         with ease. The patient tolerated the procedure well.                          The quality of the bowel preparation was excellent. Findings:      The perianal and digital rectal examinations were normal.      Multiple medium-mouthed and small-mouthed diverticula were found in the       ascending colon.      Non-bleeding internal hemorrhoids were found during retroflexion. The       hemorrhoids were small and Grade I (internal hemorrhoids that do not       prolapse).      The exam was otherwise without abnormality on direct and retroflexion       views. Impression:            - Diverticulosis in the ascending colon.                        - Non-bleeding internal hemorrhoids.                        - The examination was otherwise normal on direct and                         retroflexion views.                        - No specimens collected. Recommendation:        - Discharge patient to home (with escort).                        - Resume previous diet.                        - Continue present medications.                        - Repeat colonoscopy in 10 years for screening                         purposes. Procedure Code(s):     --- Professional ---                        [phone], Colonoscopy, flexible; diagnostic, including                         collection of specimen(s) by brushing or washing, when                         performed (separate procedure) Diagnosis Code(s):     --- Professional ---  K64.0, First degree hemorrhoids                        K57.30, Diverticulosis of large intestine without                         perforation or abscess without bleeding CPT copyright 2022 American Medical Association. All rights reserved. The codes documented in this report are preliminary and upon coder review may  be revised to meet current compliance requirements. Jonathon Bellows, MD Jonathon Bellows MD, MD 06/19/2022 9:12:10 AM This report has been signed electronically. Number of Addenda: 0 Note Initiated On: 06/19/2022 8:39 AM Scope Withdrawal  Time: 0 hours 10 minutes 33 seconds  Total Procedure Duration: 0 hours 15 minutes 2 seconds  Estimated Blood Loss:  Estimated blood loss: none.      [facility]

## 2022-06-19 NOTE — Anesthesia Procedure Notes (Signed)
Procedure Name: MAC Date/Time: 06/19/2022 8:50 AM  Performed by: Jerrye Noble, CRNAPre-anesthesia Checklist: Patient identified, Emergency Drugs available, Suction available and Patient being monitored Patient Re-evaluated:Patient Re-evaluated prior to induction Oxygen Delivery Method: Nasal cannula

## 2022-06-20 ENCOUNTER — Telehealth: Payer: Self-pay | Admitting: Internal Medicine

## 2022-06-20 ENCOUNTER — Encounter: Payer: Self-pay | Admitting: Gastroenterology

## 2022-06-20 NOTE — Telephone Encounter (Signed)
yes

## 2022-06-20 NOTE — Telephone Encounter (Signed)
Pt asked for a blood gluclose testing kit sent to pharmacy / her Blood sugar was 145 yesterday at her colonoscopy appt / please advise   [place] [employer]    Pt also asked for a call when her colonoscopy result come in

## 2022-06-20 NOTE — Telephone Encounter (Signed)
Is it okay to send in a glucose monitor?   Thanks,   -[name]

## 2022-06-21 MED ORDER — BLOOD GLUCOSE TEST VI STRP: ORAL_STRIP | 1 refills | Status: DC

## 2022-06-21 MED ORDER — LANCETS MISC. MISC: 1 refills | Status: DC

## 2022-06-21 MED ORDER — BLOOD GLUCOSE MONITORING SUPPL DEVI: 0 refills | Status: DC

## 2022-06-21 MED ORDER — LANCET DEVICE MISC: 0 refills | Status: DC

## 2022-06-21 NOTE — Telephone Encounter (Signed)
[facility] sent to [facility].   Thanks,   -[name]

## 2022-06-21 NOTE — Addendum Note (Signed)
Addended by: Ashley Royalty E on: 06/21/2022 01:13 PM   Modules accepted: Orders

## 2022-06-26 ENCOUNTER — Ambulatory Visit
Admission: RE | Admit: 2022-06-26 | Discharge: 2022-06-26 | Disposition: A | Discharge status: Home / Self Care | Payer: 59 | Source: Ambulatory Visit | Attending: Physician Assistant | Admitting: Physician Assistant

## 2022-06-26 DIAGNOSIS — R519 Headache, unspecified: Secondary | ICD-10-CM

## 2022-06-26 MED ORDER — GADOPICLENOL 0.5 MMOL/ML IV SOLN
9.0000 mL | Freq: Once | INTRAVENOUS | Status: AC | PRN
  Administered 2022-06-26: 9 mL via INTRAVENOUS

## 2022-06-30 ENCOUNTER — Other Ambulatory Visit: Payer: Self-pay

## 2022-06-30 ENCOUNTER — Telehealth: Payer: Self-pay | Admitting: Internal Medicine

## 2022-06-30 ENCOUNTER — Ambulatory Visit: Payer: Self-pay | Admitting: *Deleted

## 2022-06-30 NOTE — Telephone Encounter (Signed)
Patient requesting results for MRI. No result note or documentation noted from PCP at this time. Please review MRI 06/26/22 and call patient back with results prior to weekend if possible.

## 2022-06-30 NOTE — Telephone Encounter (Signed)
This was not ordered by me, ordered by neurology. She needs to contact them

## 2022-06-30 NOTE — Telephone Encounter (Signed)
Medication Refill - Medication: Magnesium oxide 24 mg She was unable to reach prescribing Dr.  Has the patient contacted their pharmacy? yes (Agent: If no, request that the patient contact the pharmacy for the refill. If patient does not wish to contact the pharmacy document the reason why and proceed with request.) (Agent: If yes, when and what did the pharmacy advise?)contact pcp  Preferred Pharmacy (with phone number or street name):  [place], [place] Phone: [phone]  Fax: [phone]     Has the patient been seen for an appointment in the last year OR does the patient have an upcoming appointment? yes  Agent: Please be advised that RX refills may take up to 3 business days. We ask that you follow-up with your pharmacy.

## 2022-06-30 NOTE — Telephone Encounter (Signed)
Did you order an MRI for pt?   Thanks,   -[name]

## 2022-07-03 NOTE — Telephone Encounter (Signed)
This medication is not on her current medication list.  I also believe that this is not the correct dose, we need to have her verify the correct dose.  What is she taking the magnesium oxide for?

## 2022-07-03 NOTE — Telephone Encounter (Signed)
Per [employer].  Pt takes Mag oxide '400mg'$  daily.    Pt states her previous Dr. told her to take it for heart health.    Thanks,   -[name]

## 2022-07-04 MED ORDER — MAGNESIUM OXIDE 400 MG PO CAPS: 1.0000 | ORAL_CAPSULE | Freq: Every day | ORAL | 0 refills | Status: DC

## 2022-07-04 NOTE — Addendum Note (Signed)
Addended by: Jearld Fenton on: 07/04/2022 08:55 AM   Modules accepted: Orders

## 2022-07-04 NOTE — Telephone Encounter (Signed)
RX for Magnesium Oxide sent to pharmacy

## 2022-07-10 ENCOUNTER — Ambulatory Visit (INDEPENDENT_AMBULATORY_CARE_PROVIDER_SITE_OTHER): Payer: 59 | Admitting: Podiatry

## 2022-07-10 DIAGNOSIS — Z91199 Patient's noncompliance with other medical treatment and regimen due to unspecified reason: Secondary | ICD-10-CM

## 2022-07-12 NOTE — Progress Notes (Signed)
Patient was no-show for appointment today 

## 2022-07-25 ENCOUNTER — Ambulatory Visit: Payer: Self-pay

## 2022-07-25 NOTE — Telephone Encounter (Signed)
noted 

## 2022-07-25 NOTE — Telephone Encounter (Signed)
      Chief Complaint: Pt. Sees blood when wiping after voiding. Asking to be worked in this week. No availability until 07/31/22. Symptoms: Mild abdominal pain, feels tired Frequency: Thursday Pertinent Negatives: Patient denies any other symptoms Disposition: [] ED /[] Urgent Care (no appt availability in office) / [] Appointment(In office/virtual)/ []  [facility]/ [] Home Care/ [] Refused Recommended Disposition /[] [facility]/ [x]  Follow-up with PCP Additional Notes: Please advise pt.  Answer Assessment - Initial Assessment Questions 1. AMOUNT: "Describe the bleeding that you are having."    - SPOTTING: spotting, or pinkish / brownish mucous discharge; does not fill panty liner or pad    - MILD:  less than 1 pad / hour; less than patient's usual menstrual bleeding   - MODERATE: 1-2 pads / hour; 1 menstrual cup every 6 hours; small-medium blood clots (e.g., pea, grape, small coin)   - SEVERE: soaking 2 or more pads/hour for 2 or more hours; 1 menstrual cup every 2 hours; bleeding not contained by pads or continuous red blood from vagina; large blood clots (e.g., golf ball, large coin)      Light red when wipes after voiding 2. ONSET: "When did the bleeding begin?" "Is it continuing now?"     Thursday 3. MENSTRUAL PERIOD: "When was the last normal menstrual period?" "How is this different than your period?"     Years 4. REGULARITY: "How regular are your periods?"     N/a 5. ABDOMEN PAIN: "Do you have any pain?" "How bad is the pain?"  (e.g., Scale 1-10; mild, moderate, or severe)   - MILD (1-3): doesn't interfere with normal activities, abdomen soft and not tender to touch    - MODERATE (4-7): interferes with normal activities or awakens from sleep, abdomen tender to touch    - SEVERE (8-10): excruciating pain, doubled over, unable to do any normal activities      Mild 6. PREGNANCY: "Is there any chance you are pregnant?" "When was your last menstrual period?"      No 7. BREASTFEEDING: "Are you breastfeeding?"     No 8. HORMONE MEDICINES: "Are you taking any hormone medicines, prescription or over-the-counter?" (e.g., birth control pills, estrogen)     None 9. BLOOD THINNER MEDICINES: "Do you take any blood thinners?" (e.g., Coumadin / warfarin, Pradaxa / dabigatran, aspirin)     Aspirin 10. CAUSE: "What do you think is causing the bleeding?" (e.g., recent gyn surgery, recent gyn procedure; known bleeding disorder, cervical cancer, polycystic ovarian disease, fibroids)         Unsure 11. HEMODYNAMIC STATUS: "Are you weak or feeling lightheaded?" If Yes, ask: "Can you stand and walk normally?"        Yes 12. OTHER SYMPTOMS: "What other symptoms are you having with the bleeding?" (e.g., passed tissue, vaginal discharge, fever, menstrual-type cramps)       Feels tired  Protocols used: Vaginal Bleeding - Abnormal-A-AH

## 2022-07-25 NOTE — Telephone Encounter (Signed)
Called patient and offered her an appointment with Dr. Parks Ranger and patient wanted to see a female.  Since her provider Rollene Fare is on vacation this week.  An appointment was offered to see Talitha Givens at [facility] tomorrow, 07/26/22.  Patient took appointment and directions were given.

## 2022-07-26 ENCOUNTER — Telehealth: Payer: Self-pay | Admitting: *Deleted

## 2022-07-26 ENCOUNTER — Encounter: Payer: Self-pay | Admitting: Physician Assistant

## 2022-07-26 ENCOUNTER — Ambulatory Visit (INDEPENDENT_AMBULATORY_CARE_PROVIDER_SITE_OTHER): Payer: 59 | Admitting: Physician Assistant

## 2022-07-26 VITALS — BP 108/72 | HR 86 | Temp 97.9°F | Resp 16 | Ht 66.0 in | Wt 201.5 lb

## 2022-07-26 DIAGNOSIS — N939 Abnormal uterine and vaginal bleeding, unspecified: Secondary | ICD-10-CM | POA: Diagnosis not present

## 2022-07-26 DIAGNOSIS — R8281 Pyuria: Secondary | ICD-10-CM

## 2022-07-26 DIAGNOSIS — R310 Gross hematuria: Secondary | ICD-10-CM | POA: Diagnosis not present

## 2022-07-26 LAB — POCT URINALYSIS DIPSTICK
Bilirubin, UA: NEGATIVE
Glucose, UA: NEGATIVE
Ketones, UA: NEGATIVE
Nitrite, UA: NEGATIVE
Protein, UA: NEGATIVE
Spec Grav, UA: 1.01 (ref 1.010–1.025)
Urobilinogen, UA: 0.2 E.U./dL
pH, UA: 5 (ref 5.0–8.0)

## 2022-07-26 NOTE — Telephone Encounter (Signed)
Patient calling for urine results- patient advised of UA results and pending culture result. Discussed reasons for UTI and prevention. Patient will await culture results.

## 2022-07-26 NOTE — Progress Notes (Unsigned)
Acute Office Visit   Patient: Stacy Cunningham   DOB: [birth year]   72 y.o. Female  MRN: [id number] Visit Date: 07/26/2022  Today's healthcare provider: Dani Gobble Laquashia Mergenthaler, PA-C  Introduced myself to the patient as a Journalist, newspaper and provided education on APPs in clinical practice.    Chief Complaint  Patient presents with   Vaginal Bleeding    Pt states had a polyp removed few months ago, she states she is bleeding on a daily with or without urinating.   Subjective    HPI HPI     Vaginal Bleeding    Additional comments: Pt states had a polyp removed few months ago, she states she is bleeding on a daily with or without urinating.      Last edited by Salomon Fick, [place] on 07/26/2022 11:35 AM.      She first noticed the bleeding this past weekend, 07/23/22 She reports vaginal bleeding- she has noticed bleeding off and on during the day and notes blood when she wipes She denies abdominal cramping, pain or vaginal pain, changes to vaginal discharge She reports low back pain and minimal suprapubic pain yesterday - lasted about 5 minutes yesterday   She is reports light red blood when wiping. She denies having to use a panty liner or pad   She did have a uterine polyp removed in 08/2021 and is concerned that this is from similar cause  She denies rectal bleeding and just completed her colonoscopy in Feb 2024   She reports she called the [facility] office that removed the polyp and they instructed her to seek care at her PCP office    Medications: Outpatient Medications Prior to Visit  Medication Sig   Accu-Chek Softclix Lancets lancets    acetaminophen (TYLENOL) 500 MG tablet Take 1,000 mg by mouth 2 (two) times daily as needed for moderate pain or headache.   Alpha-Lipoic Acid 600 MG CAPS TAKE 1 CAPSULE BY MOUTH DAILY   aspirin 81 MG EC tablet Take 81 mg by mouth daily.   Blood Glucose Monitoring Suppl DEVI Use to check blood sugar once a day.  DX: E11.9. May substitute to any  manufacturer covered by patient's insurance.   brimonidine (ALPHAGAN) 0.15 % ophthalmic solution Place 1 drop into both eyes 3 (three) times daily.   clotrimazole-betamethasone (LOTRISONE) cream Apply topically 2 (two) times daily.   COMBIGAN 0.2-0.5 % ophthalmic solution Apply 1 drop to eye 2 (two) times daily.   diazepam (VALIUM) 5 MG tablet Take 5 mg by mouth.   diclofenac Sodium (VOLTAREN) 1 % GEL Apply 4 g topically 4 (four) times daily.   dorzolamide-timolol (COSOPT) 22.3-6.8 MG/ML ophthalmic solution Place 1 drop into both eyes at bedtime.   famotidine (PEPCID) 40 MG tablet Take 1 tablet (40 mg total) by mouth daily.   ferrous sulfate 325 (65 FE) MG tablet Take 1 tablet (325 mg total) by mouth 2 (two) times daily with a meal.   gabapentin (NEURONTIN) 800 MG tablet Take 800 mg by mouth 3 (three) times daily.   Glucose Blood (BLOOD GLUCOSE TEST STRIPS) STRP Use to check blood sugar once a day.  DX: E11.9. May substitute to any manufacturer covered by patient's insurance.   hydrochlorothiazide (HYDRODIURIL) 25 MG tablet Take 1 tablet (25 mg total) by mouth daily.   ibuprofen (ADVIL) 800 MG tablet Take 800 mg by mouth every 8 (eight) hours as needed.   Lancet Device MISC Use to check blood  sugar once a day.  DX: E11.9. May substitute to any manufacturer covered by patient's insurance.   Lancets Misc. MISC Use to check blood sugar once a day.  DX: E11.9. May substitute to any manufacturer covered by patient's insurance.   lidocaine (LIDODERM) 5 % Place 2 patches onto the skin daily as needed (pain). Remove & Discard patch within 12 hours or as directed by MD   lisinopril (ZESTRIL) 2.5 MG tablet Take 2.5 mg by mouth daily.   Magnesium Oxide 400 MG CAPS Take 1 capsule (400 mg total) by mouth daily with breakfast.   metFORMIN (GLUCOPHAGE) 500 MG tablet Take 2 tablets (1,000 mg total) by mouth daily with breakfast. And 500mg  in the evening   Multiple Vitamin (MULTIVITAMIN ADULT PO) Take by mouth.    nortriptyline (PAMELOR) 10 MG capsule Take 10 mg by mouth 2 (two) times daily.   omeprazole (PRILOSEC) 20 MG capsule Take 1 capsule (20 mg total) by mouth daily.   propranolol (INDERAL) 20 MG tablet Take 20 mg by mouth 2 (two) times daily.   simvastatin (ZOCOR) 20 MG tablet Take 1 tablet (20 mg total) by mouth every evening.   SUMAtriptan (IMITREX) 100 MG tablet Take by mouth.   timolol (BETIMOL) 0.5 % ophthalmic solution Place 1 drop into both eyes 2 (two) times daily.   tiZANidine (ZANAFLEX) 4 MG tablet Take 1 tablet (4 mg total) by mouth every 8 (eight) hours as needed for muscle spasms.   VIGAMOX 0.5 % ophthalmic solution Place 1 drop into the right eye 4 (four) times daily.   carbamazepine (TEGRETOL) 100 MG chewable tablet Chew 100 mg by mouth. (Patient not taking: Reported on 06/19/2022)   No facility-administered medications prior to visit.    Review of Systems  Gastrointestinal:  Positive for abdominal pain. Negative for abdominal distention, anal bleeding, blood in stool and rectal pain.  Genitourinary:  Positive for vaginal bleeding. Negative for decreased urine volume, dysuria, flank pain, genital sores, hematuria, pelvic pain, vaginal discharge and vaginal pain.    {Labs  Heme  Chem  Endocrine  Serology  Results Review (optional):23779}   Objective    There were no vitals taken for this visit. {Show previous vital signs (optional):23777}  Physical Exam Vitals reviewed.  Constitutional:      General: She is awake.     Appearance: Normal appearance. She is well-developed and well-groomed.  HENT:     Head: Normocephalic and atraumatic.  Neurological:     Mental Status: She is alert.  Psychiatric:        Behavior: Behavior is cooperative.       No results found for any visits on 07/26/22.  Assessment & Plan      No follow-ups on file.

## 2022-07-27 MED ORDER — NITROFURANTOIN MONOHYD MACRO 100 MG PO CAPS: 100.0000 mg | ORAL_CAPSULE | Freq: Two times a day (BID) | ORAL | 0 refills | Status: AC

## 2022-07-27 NOTE — Telephone Encounter (Signed)
Your urinalysis was concerning for a UTI. We have sent in a sample for culture to identify the bacteria and will keep you updated on the results once they come back. I have sent in a script for an antibiotic for you to take to treat the UTI. I think we should make sure that this was not the cause of your bleeding before you reach out to your GYN provider and pursue the concerns of polyps. Let us know if you have further questions or concerns

## 2022-07-29 LAB — URINE CULTURE
MICRO NUMBER:: 14778839
SPECIMEN QUALITY:: ADEQUATE

## 2022-07-31 ENCOUNTER — Telehealth: Payer: Self-pay | Admitting: Internal Medicine

## 2022-07-31 NOTE — Progress Notes (Signed)
Your urine culture was positive for bacteria meaning you did have a UTI. This could have been the cause of your bleeding concerns but we should still keep monitoring for more bleeding after the UTI is treated. The antibiotic we sent in for you was effective for the bacteria that was causing your UTI. Please make sure you finish the entire course of the medication to make sure the infection is appropriately treated. Let us know if you have further questions or concerns.

## 2022-07-31 NOTE — Telephone Encounter (Signed)
I did not talk to her.   Thanks,   -Vernona Rieger

## 2022-07-31 NOTE — Telephone Encounter (Signed)
Pt is calling in because she was speaking with someone from the office and was trying to ask for additional information but the call was disconnected. Pt doesn't remember who she spoke with. Please advise.

## 2022-07-31 NOTE — Telephone Encounter (Signed)
I did not call her. Had you tried to call her?

## 2022-08-01 ENCOUNTER — Telehealth: Payer: Self-pay | Admitting: Internal Medicine

## 2022-08-01 NOTE — Telephone Encounter (Signed)
Pt is calling in because she wants to know does she need to continue taking her Magnesium medication or can she discontinue the medication? Please advise.

## 2022-08-02 NOTE — Telephone Encounter (Signed)
She should keep taking it until her follow up appt with me

## 2022-08-02 NOTE — Telephone Encounter (Signed)
Left message advising pt.   Thanks,   -Mylynn Dinh  

## 2022-08-03 ENCOUNTER — Ambulatory Visit (INDEPENDENT_AMBULATORY_CARE_PROVIDER_SITE_OTHER): Payer: 59 | Admitting: Neurosurgery

## 2022-08-03 ENCOUNTER — Encounter: Payer: Self-pay | Admitting: Neurosurgery

## 2022-08-03 VITALS — BP 124/80 | Ht 66.0 in | Wt 201.0 lb

## 2022-08-03 DIAGNOSIS — M48062 Spinal stenosis, lumbar region with neurogenic claudication: Secondary | ICD-10-CM

## 2022-08-03 NOTE — Progress Notes (Signed)
Referring Physician:  Lorre Munroe, NP [address] [place],  [place] [address]  Primary Physician:  Lorre Munroe, NP  History of Present Illness: 08/03/2022 She has only been to 1 visit of physical therapy.  Her symptoms are the same.  06/06/2022 Ms. Sheilla Weisbrot is here today with a chief complaint of bilateral low back pain with radiation to the bilateral buttock, posterior lateral thighs and calves along with numbness, tingling, burning, pain in bilateral legs and feet.  He has had symptoms for many years, but worsening over the past 1 to 2 years.  She reports numbness and tingling in her legs with sharp pain at times.  Standing and walking make it worse.  Rest, sitting, and leaning over grocery cart make it better.  She denies weakness or bowel or bladder issues.  EMG 01/05/20 by Dr Malvin Johns    Conservative measures:  Physical therapy: has not participated in but has been referred to [facility] and has her initial evaluation on 06/13/22.  Multimodal medical therapy including regular antiinflammatories:  tylenol, cyclobenzaprine, gabapentin, ibuprofen, lidocaine patches, lyrica, baclofen Injections: has received epidural steroid injections 03/03/2021: Bilateral L4-5 transforaminal ESI (about 6 weeks of good relief) 07/15/2020: Bilateral L4-5 transforaminal ESI (good relief) 06/04/2020: Right hip joint injection performed by Dr. Landry Mellow (no relief)  02/12/2020: Bilateral S1 transforaminal ESI (little relief)  08/12/2019: Bilateral S1 transforaminal ESI (good relief)  06/18/2019: Bilateral S1 transforaminal ESI (3 days moderate relief the return of symptoms)    Past Surgery: C5-6 ACDF by Dr Adriana Simas in 2020  Rondell Zervas has no symptoms of cervical myelopathy.  The symptoms are causing a significant impact on the patient's life.   I have utilized the care everywhere function in epic to review the outside records available from external health systems.  Review of Systems:  A 10  point review of systems is negative, except for the pertinent positives and negatives detailed in the HPI.  Past Medical History: Past Medical History:  Diagnosis Date   Anemia    Borderline diabetes mellitus    Diabetes mellitus without complication    Elevated lipids    GERD (gastroesophageal reflux disease)    Glaucoma    Headache    h/o migraines   Hypertension    Neuropathy    Palpitations    a. 05/2020 Zio: Predominantly sinus rhythm, 80 (60-148).  1 SVT run-4 beats at 148.  Rare PACs and PVCs; b. 04/2021 Zio: Predominantly sinus rhythm, 80 (59-140).  Rare PACs/PVCs.   Panic attack     Past Surgical History: Past Surgical History:  Procedure Laterality Date   ANTERIOR CERVICAL DECOMP/DISCECTOMY FUSION N/A 09/30/2018   Procedure: ANTERIOR CERVICAL DECOMPRESSION/DISCECTOMY FUSION 1 LEVEL - C 5/6;  Surgeon: Lucy Chris, MD;  Location: [facility];  Service: Neurosurgery;  Laterality: N/A;   CATARACT EXTRACTION W/PHACO Right 02/15/2022   Procedure: CATARACT EXTRACTION PHACO AND INTRAOCULAR LENS PLACEMENT (IOC) RIGHT KAHOOK DUAL BLADE GONIOTOMY DIABETIC 6.21 01:05.9;  Surgeon: Lockie Mola, MD;  Location[facility];  Service: Ophthalmology;  Laterality: Right;   CHOLECYSTECTOMY N/A 02/06/2018   Procedure: LAPAROSCOPIC CHOLECYSTECTOMY;  Surgeon: Carolan Shiver, MD;  Location: [facility];  Service: General;  Laterality: N/A;   COLONOSCOPY WITH PROPOFOL N/A 07/13/2017   Procedure: COLONOSCOPY WITH PROPOFOL;  Surgeon: Pasty Spillers, MD;  Location: [facility];  Service: Endoscopy;  Laterality: N/A;   COLONOSCOPY WITH PROPOFOL N/A 06/19/2022   Procedure: COLONOSCOPY WITH PROPOFOL;  Surgeon: Wyline Mood, MD;  Location:  [facility];  Service: Gastroenterology;  Laterality: N/A;   ESOPHAGOGASTRODUODENOSCOPY (EGD) WITH PROPOFOL N/A 09/14/2017   Procedure: ESOPHAGOGASTRODUODENOSCOPY (EGD) WITH PROPOFOL;  Surgeon: Pasty Spillersahiliani, Varnita B, MD;  Location: [facility];   Service: Endoscopy;  Laterality: N/A;    Allergies: Allergies as of 08/03/2022 - Review Complete 07/26/2022  Allergen Reaction Noted   Sulfa antibiotics Dermatitis 07/27/2016   Darvon [propoxyphene] Hives 06/17/2014   Other Other (See Comments) and Rash 02/01/2018   Tape Rash 02/01/2018    Medications: No outpatient medications have been marked as taking for the 08/03/22 encounter (Appointment) with Venetia NightYarbrough, Sandi Towe, MD.    Social History: Social History   Tobacco Use   Smoking status: Never   Smokeless tobacco: Never  Vaping Use   Vaping Use: Never used  Substance Use Topics   Alcohol use: Not Currently    Comment: rare   Drug use: No    Family Medical History: Family History  Problem Relation Age of Onset   Breast cancer Sister 3650   Heart attack Mother 7083   Hyperlipidemia Mother    Hypertension Mother     Physical Examination: There were no vitals filed for this visit.   General: Patient is well developed, well nourished, calm, collected, and in no apparent distress. Attention to examination is appropriate.  Neck:   Supple.  Full range of motion.  Respiratory: Patient is breathing without any difficulty.   NEUROLOGICAL:     Awake, alert, oriented to person, place, and time.  Speech is clear and fluent.   Cranial Nerves: Pupils equal round and reactive to light.  Facial tone is symmetric.  Facial sensation is symmetric. Shoulder shrug is symmetric. Tongue protrusion is midline.  There is no pronator drift.  ROM of spine: full.    Strength: Side Biceps Triceps Deltoid Interossei Grip Wrist Ext. Wrist Flex.  R 5 5 5 5 5 5 5   L 5 5 5 5 5 5 5    Side Iliopsoas Quads Hamstring PF DF EHL  R 5 5 5 5 5 5   L 5 5 5 5 5 5    Reflexes are 1+ and symmetric at the biceps, triceps, brachioradialis, patella and achilles.   Hoffman's is absent.   Bilateral upper and lower extremity sensation is intact to light touch.    No evidence of dysmetria noted.  Gait is  normal.     Medical Decision Making  Imaging: MRI L spine 05/26/22 IMPRESSION: 1. Overall stable multilevel disc disease and facet disease. 2. Stable moderate spinal and moderately severe bilateral lateral recess stenosis at L2-3. 3. Stable severe spinal and bilateral lateral recess stenosis at L3-4. 4. Stable moderately severe spinal and bilateral lateral recess stenosis at L4-5. 5. Persistent moderate-sized central disc protrusion at L5-S1 with mass effect on the ventral thecal sac and potential irritation of both S1 nerve roots.     Electronically Signed   By: Rudie MeyerP.  Gallerani M.D.   On: 05/27/2022 19:18  I have personally reviewed the images and agree with the above interpretation.  Assessment and Plan: Ms. Renee HarderMedley is a pleasant 72 y.o. female with multilevel lumbar stenosis causing neurogenic claudication.  She has tried injections without impact.  I have recommended that she continue physical therapy.  She has not exhausted conservative management.  Will see her back after she finishes physical therapy.     If her symptoms do not improve, we will discuss L2-S1 decompression to alleviate the compression of her nerve roots.  I spent a total of  5 minutes in this patient's care today. This time was spent reviewing pertinent records including imaging studies, obtaining and confirming history, performing a directed evaluation, formulating and discussing my recommendations, and documenting the visit within the medical record.    Thank you for involving me in the care of this patient.      Sinclaire Artiga K. Myer Haff MD, [facility] Neurosurgery

## 2022-08-04 ENCOUNTER — Other Ambulatory Visit: Payer: Self-pay | Admitting: Internal Medicine

## 2022-08-04 NOTE — Telephone Encounter (Signed)
Pt says she needs this medicine today. Pt is completely out of medication.

## 2022-08-16 ENCOUNTER — Ambulatory Visit: Payer: Self-pay

## 2022-08-16 NOTE — Telephone Encounter (Signed)
  Chief Complaint: headaches and medication propranolol not helping  Symptoms: headaches all over, unable to sleep . Can not lay on left side causes aches in head. Blurred vision at times. Pain behind eyes at times. Dizziness at times.  Frequency: na  Pertinent Negatives: Patient denies severe headache no weakness on either side of body. No decrease balance  Disposition: ED /[] Urgent Care (no appt availability in office) / Appointment(In office/virtual)/  [facility]/ Home Care/ Refused Recommended Disposition /[] [facility]/  Follow-up with PCP Additional Notes:   Appt scheduled for 08/18/22. Patient no transportation tomorrow. Please advise regarding propranolol. Patient would like to review MRI results from 06/27/22. Please advise     .  Reason for Disposition  [1] MODERATE headache (e.g., interferes with normal activities) AND [2] present > 24 hours AND [3] unexplained  (Exceptions: analgesics not tried, typical migraine, or headache part of viral illness)  Answer Assessment - Initial Assessment Questions 1. LOCATION: "Where does it hurt?"      All over different places  2. ONSET: "When did the headache start?" (Minutes, hours or days)      On going  3. PATTERN: "Does the pain come and go, or has it been constant since it started?"     Comes and goes mostly at night but can happen during the day  4. SEVERITY: "How bad is the pain?" and "What does it keep you from doing?"  (e.g., Scale 1-10; mild, moderate, or severe)   - MILD (1-3): doesn't interfere with normal activities    - MODERATE (4-7): interferes with normal activities or awakens from sleep    - SEVERE (8-10): excruciating pain, unable to do any normal activities        Unable to sleep wakes up from sleep can not lay on left side of head causes worsening ache 5. RECURRENT SYMPTOM: "Have you ever had headaches before?" If Yes, ask: "When was the last time?" and "What happened that time?"       Yes  6. CAUSE: "What do you think is causing the headache?"     No sure  7. MIGRAINE: "Have you been diagnosed with migraine headaches?" If Yes, ask: "Is this headache similar?"      na 8. HEAD INJURY: "Has there been any recent injury to the head?"      na 9. OTHER SYMPTOMS: "Do you have any other symptoms?" (fever, stiff neck, eye pain, sore throat, cold symptoms)     Dizziness pain behind eyes and pain in temples burry vision at times.  10. PREGNANCY: "Is there any chance you are pregnant?" "When was your last menstrual period?"       na  Protocols used: [facility]

## 2022-08-16 NOTE — Telephone Encounter (Signed)
2nd attempt, Patient called, left VM to return the call to the office to discuss symptoms with a nurse.  

## 2022-08-16 NOTE — Telephone Encounter (Signed)
Patient called, left VM to return the call to the office to discuss symptoms with a nurse.  Summary: Headaches   Patient states that she is still having bad headaches at night. Patient states that she is taking propanolol but it does not seem to be helping.

## 2022-08-17 NOTE — Telephone Encounter (Signed)
She sees neurology for this.  I would really recommend that she follows up with them.

## 2022-08-17 NOTE — Telephone Encounter (Signed)
Pt advised.  She is going to call neurology and decided to cancel her appointment for tomorrow. (08/18/2022)   Thanks,   -Vernona Rieger

## 2022-08-18 ENCOUNTER — Ambulatory Visit: Payer: 59 | Admitting: Internal Medicine

## 2022-08-28 ENCOUNTER — Ambulatory Visit: Payer: Self-pay | Admitting: *Deleted

## 2022-08-28 ENCOUNTER — Telehealth: Payer: Self-pay

## 2022-08-28 NOTE — Transitions of Care (Post Inpatient/ED Visit) (Signed)
08/28/2022  Name: Stacy Cunningham MRN: [id number] DOB: 28-Feb-1951  Today's TOC FU Call Status: Today's TOC FU Call Status:: Successful TOC FU Call Competed TOC FU Call Complete Date: 08/28/22  Transition Care Management Follow-up Telephone Call Date of Discharge: 08/25/22 Discharge Facility: Other ([facility]) Name of Other ([facility]) Discharge Facility: [facility] Type of Discharge: Emergency Department Reason for ED Visit: Cardiac Conditions Cardiac Conditions Diagnosis: Chest Pain Persisting How have you been since you were released from the hospital?: Better Any questions or concerns?: No  Items Reviewed: Did you receive and understand the discharge instructions provided?: Yes Medications obtained,verified, and reconciled?: Yes (Medications Reviewed) Any new allergies since your discharge?: No Dietary orders reviewed?: NA Do you have support at home?: Yes People in Home: spouse  Medications Reviewed Today: Medications Reviewed Today     Reviewed by Aretha Parrot, CMA (Certified Medical Assistant) on 08/03/22 at 1448  Med List Status: <None>   Medication Order Taking? Sig Documenting Provider Last Dose Status Informant  Accu-Chek Softclix Lancets lancets [id number] Yes  [provider] Taking Active   acetaminophen (TYLENOL) 500 MG tablet [id number] Yes Take 1,000 mg by mouth 2 (two) times daily as needed for moderate pain or headache. [provider] Taking Active Self  aspirin 81 MG EC tablet [id number] Yes Take 81 mg by mouth daily. [provider] Taking Active   Blood Glucose Monitoring Suppl DEVI [id number] Yes Use to check blood sugar once a day.  DX: E11.9. May substitute to any manufacturer covered by patient's insurance. Lorre Munroe, NP Taking Active   brimonidine (ALPHAGAN) 0.15 % ophthalmic solution [id number] Yes Place 1 drop into both eyes 3 (three) times daily. [provider] Taking Active   clotrimazole-betamethasone  (LOTRISONE) cream [id number] Yes Apply topically 2 (two) times daily. Lorre Munroe, NP Taking Active   COMBIGAN 0.2-0.5 % ophthalmic solution [id number] Yes Apply 1 drop to eye 2 (two) times daily. [provider] Taking Active   diclofenac Sodium (VOLTAREN) 1 % GEL [id number] Yes Apply 4 g topically 4 (four) times daily. Lorre Munroe, NP Taking Active   dorzolamide-timolol (COSOPT) 22.3-6.8 MG/ML ophthalmic solution [id number] Yes Place 1 drop into both eyes at bedtime. [provider] Taking Active   famotidine (PEPCID) 40 MG tablet [id number] Yes Take 1 tablet (40 mg total) by mouth daily. Lorre Munroe, NP Taking Active   ferrous sulfate 325 (65 FE) MG tablet [id number] Yes Take 1 tablet (325 mg total) by mouth 2 (two) times daily with a meal. Lorre Munroe, NP Taking Active   gabapentin (NEURONTIN) 800 MG tablet [id number] Yes Take 800 mg by mouth 3 (three) times daily. [provider] Taking Active   Glucose Blood (BLOOD GLUCOSE TEST STRIPS) STRP [id number] Yes Use to check blood sugar once a day.  DX: E11.9. May substitute to any manufacturer covered by patient's insurance. Lorre Munroe, NP Taking Active   hydrochlorothiazide (HYDRODIURIL) 25 MG tablet [id number] Yes Take 1 tablet (25 mg total) by mouth daily. Lorre Munroe, NP Taking Active   ibuprofen (ADVIL) 800 MG tablet [id number] Yes Take 800 mg by mouth every 8 (eight) hours as needed. [provider] Taking Active   Lancet Device MISC [id number] Yes Use to check blood sugar once a day.  DX: E11.9. May substitute to any manufacturer covered by patient's insurance. Lorre Munroe, NP Taking Active   Lancets Misc. MISC [id number] Yes Use to check blood sugar once a day.  DX: E11.9.  May substitute to any manufacturer covered by patient's insurance. Lorre Munroe, NP Taking Active   lidocaine (LIDODERM) 5 % [id number] Yes Place 2 patches onto the skin daily as needed (pain). Remove & Discard patch within  12 hours or as directed by MD Lorre Munroe, NP Taking Active   lisinopril (ZESTRIL) 2.5 MG tablet [id number] Yes Take 2.5 mg by mouth daily. [provider] Taking Active   Magnesium Oxide 400 MG CAPS [id number] Yes Take 1 capsule (400 mg total) by mouth daily with breakfast. Lorre Munroe, NP Taking Active   metFORMIN (GLUCOPHAGE) 500 MG tablet [id number] Yes Take 2 tablets (1,000 mg total) by mouth daily with breakfast. And 500mg  in the evening Lorre Munroe, NP Taking Active   nortriptyline (PAMELOR) 10 MG capsule [id number] Yes Take 10 mg by mouth 2 (two) times daily. [provider] Taking Active   omeprazole (PRILOSEC) 20 MG capsule [id number] Yes Take 1 capsule (20 mg total) by mouth daily. Lorre Munroe, NP Taking Active   propranolol (INDERAL) 20 MG tablet [id number] Yes Take 20 mg by mouth 2 (two) times daily. [provider] Taking Active   simvastatin (ZOCOR) 20 MG tablet [id number] Yes Take 1 tablet (20 mg total) by mouth every evening. Lorre Munroe, NP Taking Active   SUMAtriptan (IMITREX) 100 MG tablet [id number] Yes Take by mouth. [provider] Taking Active   tiZANidine (ZANAFLEX) 4 MG tablet [id number] Yes Take 1 tablet (4 mg total) by mouth every 8 (eight) hours as needed for muscle spasms. Venetia Night, MD Taking Active   VIGAMOX 0.5 % ophthalmic solution [id number] Yes Place 1 drop into the right eye 4 (four) times daily. [provider] Taking Active   Med List Note Valerie Salts, RN 11/09/20 1420): UDS 11-09-2020            Home Care and Equipment/Supplies: Were Home Health Services Ordered?: NA Any new equipment or medical supplies ordered?: NA  Functional Questionnaire: Do you need assistance with bathing/showering or dressing?: No Do you need assistance with meal preparation?: No Do you need assistance with eating?: No Do you have difficulty maintaining continence: No Do you need assistance with getting out of  bed/getting out of a chair/moving?: No Do you have difficulty managing or taking your medications?: No  Follow up appointments reviewed: PCP Follow-up appointment confirmed?: Yes Date of PCP follow-up appointment?: 08/31/22 Follow-up Provider: [name], NP Specialist Hospital Follow-up appointment confirmed?: Yes Date of Specialist follow-up appointment?: 08/28/22 Follow-Up Specialty Provider:: [facility] Do you need transportation to your follow-up appointment?: No Do you understand care options if your condition(s) worsen?: Yes-patient verbalized understanding    Oneal Grout, [facility]) Lovie Macadamia [phone]

## 2022-08-28 NOTE — Telephone Encounter (Signed)
Opened chart to answer question for agent.    Referred pt to practice as she had questions I could not answer regarding [facility] and going to [facility] for tests from her ED visit on Friday at [facility].  Agent putting call through to practice.

## 2022-08-29 ENCOUNTER — Telehealth: Payer: Self-pay

## 2022-08-29 ENCOUNTER — Other Ambulatory Visit (HOSPITAL_COMMUNITY): Payer: Self-pay | Admitting: Family Medicine

## 2022-08-29 DIAGNOSIS — R079 Chest pain, unspecified: Secondary | ICD-10-CM

## 2022-08-29 NOTE — Telephone Encounter (Signed)
This was ordered by [facility] cardiology.  She needs to contact them.

## 2022-08-29 NOTE — Telephone Encounter (Signed)
Copied from CRM [id number]. Topic: General - Inquiry >> Aug 28, 2022  2:49 PM [name] wrote: Reason for CRM: pt called saying she is supposed to get a CT scan of her heart.  It is scheduled for [facility] and she wants to go to [facility] for the CT scan. Pt said she was ordered from [facility] .    [phone]

## 2022-08-29 NOTE — Telephone Encounter (Signed)
Did you order this?   Thanks,   -Vernona Rieger

## 2022-08-30 ENCOUNTER — Other Ambulatory Visit: Payer: Self-pay | Admitting: Family Medicine

## 2022-08-30 DIAGNOSIS — R079 Chest pain, unspecified: Secondary | ICD-10-CM

## 2022-08-31 ENCOUNTER — Encounter: Payer: Self-pay | Admitting: Internal Medicine

## 2022-08-31 ENCOUNTER — Ambulatory Visit (INDEPENDENT_AMBULATORY_CARE_PROVIDER_SITE_OTHER): Payer: 59 | Admitting: Internal Medicine

## 2022-08-31 VITALS — BP 122/84 | HR 83 | Temp 95.9°F | Wt 201.0 lb

## 2022-08-31 DIAGNOSIS — R079 Chest pain, unspecified: Secondary | ICD-10-CM

## 2022-08-31 DIAGNOSIS — I10 Essential (primary) hypertension: Secondary | ICD-10-CM

## 2022-08-31 DIAGNOSIS — I700 Atherosclerosis of aorta: Secondary | ICD-10-CM

## 2022-08-31 DIAGNOSIS — E1169 Type 2 diabetes mellitus with other specified complication: Secondary | ICD-10-CM | POA: Diagnosis not present

## 2022-08-31 DIAGNOSIS — K219 Gastro-esophageal reflux disease without esophagitis: Secondary | ICD-10-CM

## 2022-08-31 DIAGNOSIS — E782 Mixed hyperlipidemia: Secondary | ICD-10-CM

## 2022-08-31 NOTE — Patient Instructions (Signed)
Nonspecific Chest Pain Chest pain can be caused by many different conditions. Some causes of chest pain can be life-threatening. These will require treatment right away. Serious causes of chest pain include: Heart attack. A tear in the body's main blood vessel. Redness and swelling (inflammation) around your heart. Blood clot in your lungs. Other causes of chest pain may not be so serious. These include: Heartburn. Anxiety or stress. Damage to bones or muscles in your chest. Lung infections. Chest pain can feel like: Pain or discomfort in your chest. Crushing, pressure, aching, or squeezing pain. Burning or tingling. Dull or sharp pain that is worse when you move, cough, or take a deep breath. Pain or discomfort that is also felt in your back, neck, jaw, shoulder, or arm, or pain that spreads to any of these areas. It is hard to know whether your pain is caused by something that is serious or something that is not so serious. So it is important to see your doctor right away if you have chest pain. Follow these instructions at home: Medicines Take over-the-counter and prescription medicines only as told by your doctor. If you were prescribed an antibiotic medicine, take it as told by your doctor. Do not stop taking the antibiotic even if you start to feel better. Lifestyle  Rest as told by your doctor. Do not use any products that contain nicotine or tobacco, such as cigarettes, e-cigarettes, and chewing tobacco. If you need help quitting, ask your doctor. Do not drink alcohol. Make lifestyle changes as told by your doctor. These may include: Getting regular exercise. Ask your doctor what activities are safe for you. Eating a heart-healthy diet. A diet and nutrition specialist (dietitian) can help you to learn healthy eating options. Staying at a healthy weight. Treating diabetes or high blood pressure, if needed. Lowering your stress. Activities such as yoga and relaxation techniques  can help. General instructions Pay attention to any changes in your symptoms. Tell your doctor about them or any new symptoms. Avoid any activities that cause chest pain. Keep all follow-up visits as told by your doctor. This is important. You may need more testing if your chest pain does not go away. Contact a doctor if: Your chest pain does not go away. You feel depressed. You have a fever. Get help right away if: Your chest pain is worse. You have a cough that gets worse, or you cough up blood. You have very bad (severe) pain in your belly (abdomen). You pass out (faint). You have either of these for no clear reason: Sudden chest discomfort. Sudden discomfort in your arms, back, neck, or jaw. You have shortness of breath at any time. You suddenly start to sweat, or your skin gets clammy. You feel sick to your stomach (nauseous). You throw up (vomit). You suddenly feel lightheaded or dizzy. You feel very weak or tired. Your heart starts to beat fast, or it feels like it is skipping beats. These symptoms may be an emergency. Do not wait to see if the symptoms will go away. Get medical help right away. Call your local emergency services (911 in the [place] Do not drive yourself to the hospital. Summary Chest pain can be caused by many different conditions. The cause may be serious and need treatment right away. If you have chest pain, see your doctor right away. Follow your doctor's instructions for taking medicines and making lifestyle changes. Keep all follow-up visits as told by your doctor. This includes visits for any further   testing if your chest pain does not go away. Be sure to know the signs that show that your condition has become worse. Get help right away if you have these symptoms. This information is not intended to replace advice given to you by your health care provider. Make sure you discuss any questions you have with your health care provider. Document Revised:  02/23/2022 Document Reviewed: 02/23/2022 Elsevier Patient Education  2023 Elsevier Inc.  

## 2022-08-31 NOTE — Progress Notes (Signed)
Subjective:    Patient ID: Stacy Cunningham, female    DOB: 1951/03/17, 72 y.o.   MRN: [id number]  HPI  Patient presents to clinic today for ER follow-up.  She presented to the ER 5/3 with a brief episode of chest pain that radiated through to her back associated with a headache.  ECG was not concerning for ACS.  Troponins were negative.  Her symptoms had resolved so she was discharged and advised to follow-up with cardiology.  She saw cardiology in follow-up on 5/6.  They ordered an echocardiogram and CTA of the coronary arteries. Since that time, she has not had any chest pain. She does not feel like she has been under any stress. She has a history of GERD, managed on Omeprazole. She feels like this chest pain was different.  Review of Systems     Past Medical History:  Diagnosis Date   Anemia    Borderline diabetes mellitus    Diabetes mellitus without complication (HCC)    Elevated lipids    GERD (gastroesophageal reflux disease)    Glaucoma    Headache    h/o migraines   Hypertension    Neuropathy    Palpitations    a. 05/2020 Zio: Predominantly sinus rhythm, 80 (60-148).  1 SVT run-4 beats at 148.  Rare PACs and PVCs; b. 04/2021 Zio: Predominantly sinus rhythm, 80 (59-140).  Rare PACs/PVCs.   Panic attack     Current Outpatient Medications  Medication Sig Dispense Refill   Accu-Chek Softclix Lancets lancets      acetaminophen (TYLENOL) 500 MG tablet Take 1,000 mg by mouth 2 (two) times daily as needed for moderate pain or headache.     aspirin 81 MG EC tablet Take 81 mg by mouth daily.     Blood Glucose Monitoring Suppl DEVI Use to check blood sugar once a day.  DX: E11.9. May substitute to any manufacturer covered by patient's insurance. 1 each 0   brimonidine (ALPHAGAN) 0.15 % ophthalmic solution Place 1 drop into both eyes 3 (three) times daily.     clotrimazole-betamethasone (LOTRISONE) cream Apply topically 2 (two) times daily. 30 g 0   COMBIGAN 0.2-0.5 % ophthalmic  solution Apply 1 drop to eye 2 (two) times daily.     diclofenac Sodium (VOLTAREN) 1 % GEL Apply 4 g topically 4 (four) times daily. 150 g 0   dorzolamide-timolol (COSOPT) 22.3-6.8 MG/ML ophthalmic solution Place 1 drop into both eyes at bedtime.     famotidine (PEPCID) 40 MG tablet Take 1 tablet (40 mg total) by mouth daily. 90 tablet 0   ferrous sulfate 325 (65 FE) MG tablet Take 1 tablet (325 mg total) by mouth 2 (two) times daily with a meal. 180 tablet 1   gabapentin (NEURONTIN) 800 MG tablet Take 800 mg by mouth 3 (three) times daily.     Glucose Blood (BLOOD GLUCOSE TEST STRIPS) STRP Use to check blood sugar once a day.  DX: E11.9. May substitute to any manufacturer covered by patient's insurance. 100 strip 1   hydrochlorothiazide (HYDRODIURIL) 25 MG tablet Take 1 tablet (25 mg total) by mouth daily. 90 tablet 0   ibuprofen (ADVIL) 800 MG tablet Take 800 mg by mouth every 8 (eight) hours as needed.     Lancet Device MISC Use to check blood sugar once a day.  DX: E11.9. May substitute to any manufacturer covered by patient's insurance. 1 each 0   Lancets Misc. MISC Use to check blood sugar once a  day.  DX: E11.9. May substitute to any manufacturer covered by patient's insurance. 100 each 1   lidocaine (LIDODERM) 5 % Place 2 patches onto the skin daily as needed (pain). Remove & Discard patch within 12 hours or as directed by MD 30 patch 0   lisinopril (ZESTRIL) 2.5 MG tablet TAKE 1 TABLET BY MOUTH DAILY FOR KIDNEY PROTECTION 90 tablet 0   Magnesium Oxide 400 MG CAPS Take 1 capsule (400 mg total) by mouth daily with breakfast. 90 capsule 0   metFORMIN (GLUCOPHAGE) 500 MG tablet Take 2 tablets (1,000 mg total) by mouth daily with breakfast. And 500mg  in the evening 270 tablet 0   nortriptyline (PAMELOR) 10 MG capsule Take 10 mg by mouth 2 (two) times daily.     omeprazole (PRILOSEC) 20 MG capsule Take 1 capsule (20 mg total) by mouth daily. 90 capsule 1   propranolol (INDERAL) 20 MG tablet Take  20 mg by mouth 2 (two) times daily.     simvastatin (ZOCOR) 20 MG tablet Take 1 tablet (20 mg total) by mouth every evening. 90 tablet 0   SUMAtriptan (IMITREX) 100 MG tablet Take by mouth.     tiZANidine (ZANAFLEX) 4 MG tablet Take 1 tablet (4 mg total) by mouth every 8 (eight) hours as needed for muscle spasms. 90 tablet 1   VIGAMOX 0.5 % ophthalmic solution Place 1 drop into the right eye 4 (four) times daily.     No current facility-administered medications for this visit.    Allergies  Allergen Reactions   Sulfa Antibiotics Dermatitis   Darvon [Propoxyphene] Hives   Other Other (See Comments) and Rash    PAPER TAPE OK TO USE PAPER TAPE OK TO USE    Tape Rash    PAPER TAPE OK TO USE    Family History  Problem Relation Age of Onset   Breast cancer Sister 64   Heart attack Mother 36   Hyperlipidemia Mother    Hypertension Mother     Social History   Socioeconomic History   Marital status: Married    Spouse name: Not on file   Number of children: Not on file   Years of education: Not on file   Highest education level: Not on file  Occupational History   Not on file  Tobacco Use   Smoking status: Never   Smokeless tobacco: Never  Vaping Use   Vaping Use: Never used  Substance and Sexual Activity   Alcohol use: Not Currently    Comment: rare   Drug use: No   Sexual activity: Yes  Other Topics Concern   Not on file  Social History Narrative   Not on file   Social Determinants of Health   Financial Resource Strain: Not on file  Food Insecurity: Not on file  Transportation Needs: Not on file  Physical Activity: Not on file  Stress: Not on file  Social Connections: Not on file  Intimate Partner Violence: Not on file     Constitutional: Patient reports intermittent headaches.  Denies fever, malaise, fatigue, or abrupt weight changes.  Respiratory: Denies difficulty breathing, shortness of breath, cough or sputum production.   Cardiovascular: Denies chest  pain, chest tightness, palpitations or swelling in the hands or feet.  Gastrointestinal: Denies abdominal pain, bloating, constipation, diarrhea or blood in the stool.  Musculoskeletal: Patient reports intermittent back pain.  Denies decrease in range of motion, difficulty with gait, or joint  swelling.  Skin: Denies redness, rashes, lesions or ulcercations.  Neurological: Denies dizziness, difficulty with memory, difficulty with speech or problems with balance and coordination.  Psych: Denies anxiety, depression, SI/HI.  No other specific complaints in a complete review of systems (except as listed in HPI above).  Objective:   Physical Exam   BP 122/84 (BP Location: Left Arm, Patient Position: Sitting, Cuff Size: Normal)   Pulse 83   Temp (!) 95.9 F (35.5 C) (Temporal)   Wt 201 lb (91.2 kg)   SpO2 99%   BMI 32.44 kg/m   Wt Readings from Last 3 Encounters:  08/03/22 201 lb (91.2 kg)  07/26/22 201 lb 8 oz (91.4 kg)  06/19/22 199 lb (90.3 kg)    General: Appears her stated age, obese, in NAD. Skin: Warm, dry and intact.  HEENT: Head: normal shape and size; Eyes: sclera white, no icterus, conjunctiva pink, PERRLA and EOMs intact;  Cardiovascular: Normal rate and rhythm. S1,S2 noted.  No murmur, rubs or gallops noted. No JVD or BLE edema. No carotid bruits noted. Pulmonary/Chest: Normal effort and positive vesicular breath sounds. No respiratory distress. No wheezes, rales or ronchi noted.  Abdomen: Soft and nontender. Normal bowel sounds.  Musculoskeletal: No difficulty with gait.  Neurological: Alert and oriented. Coordination normal.     BMET    Component Value Date/Time   NA 141 05/08/2022 1040   K 4.5 05/08/2022 1040   CL 103 05/08/2022 1040   CO2 31 05/08/2022 1040   GLUCOSE 120 (H) 05/08/2022 1040   BUN 14 05/08/2022 1040   CREATININE 0.74 05/08/2022 1040   CALCIUM 9.5 05/08/2022 1040   GFRNONAA >60 09/18/2018 1030   GFRAA >60 09/18/2018 1030    Lipid Panel      Component Value Date/Time   CHOL 133 05/08/2022 1040   TRIG 78 05/08/2022 1040   HDL 54 05/08/2022 1040   CHOLHDL 2.5 05/08/2022 1040   LDLCALC 63 05/08/2022 1040    CBC    Component Value Date/Time   WBC 7.9 05/08/2022 1040   RBC 5.07 05/08/2022 1040   HGB 11.4 (L) 05/08/2022 1040   HCT 36.1 05/08/2022 1040   PLT 271 05/08/2022 1040   MCV 71.2 (L) 05/08/2022 1040   MCH 22.5 (L) 05/08/2022 1040   MCHC 31.6 (L) 05/08/2022 1040   RDW 16.4 (H) 05/08/2022 1040   LYMPHSABS 2.4 06/05/2018 0608   MONOABS 0.5 06/05/2018 0608   EOSABS 0.1 06/05/2018 0608   BASOSABS 0.1 06/05/2018 0608    Hgb A1C Lab Results  Component Value Date   HGBA1C 7.0 (H) 05/08/2022           Assessment & Plan:   ER Follow-up for Chest Pain, HTN, HLD, DM2, GERD  ER notes, labs and imaging reviewed Cardiology notes reviewed Echo was completed yesterday CTA coronary arteries has been scheduled Discussed the importance of good blood pressure, cholesterol and glucose control She will continue omeprazole, aspirin, HCTZ, lisinopril, propranolol, metformin and simvastatin  RTC in 2 months for annual exam [name], NP

## 2022-09-01 ENCOUNTER — Other Ambulatory Visit: Payer: Self-pay | Admitting: Internal Medicine

## 2022-09-01 NOTE — Telephone Encounter (Signed)
Requested Prescriptions  Pending Prescriptions Disp Refills   hydrochlorothiazide (HYDRODIURIL) 25 MG tablet [Pharmacy Med Name: HYDROCHLOROTHIAZIDE 25 MG TAB] 90 tablet 0    Sig: TAKE 1 TABLET BY MOUTH DAILY     Cardiovascular: Diuretics - Thiazide Passed - 09/01/2022 11:38 AM      Passed - Cr in normal range and within 180 days    Creat  Date Value Ref Range Status  05/08/2022 0.74 0.50 - 1.05 mg/dL Final   Creatinine, Urine  Date Value Ref Range Status  05/08/2022 53 20 - 275 mg/dL Final         Passed - K in normal range and within 180 days    Potassium  Date Value Ref Range Status  05/08/2022 4.5 3.5 - 5.3 mmol/L Final         Passed - Na in normal range and within 180 days    Sodium  Date Value Ref Range Status  05/08/2022 141 135 - 146 mmol/L Final         Passed - Last BP in normal range    BP Readings from Last 1 Encounters:  08/31/22 122/84         Passed - Valid encounter within last 6 months    Recent Outpatient Visits           Yesterday Chest pain, unspecified type   [facility] [place], Salvadore Oxford, NP   1 month ago Vaginal bleeding   [facility] [place], Oswaldo Conroy, PA-C   3 months ago Diverticulitis of large intestine without bleeding, unspecified complication status   [place] [facility] [place], [place], [place]   3 months ago Benign essential hypertension   [place] [facility] [place], Salvadore Oxford, NP       Future Appointments             In 2 months Baity, Salvadore Oxford, NP  [facility], [facility]

## 2022-09-04 ENCOUNTER — Other Ambulatory Visit (HOSPITAL_COMMUNITY): Payer: Self-pay | Admitting: *Deleted

## 2022-09-04 ENCOUNTER — Encounter (HOSPITAL_COMMUNITY): Payer: Self-pay

## 2022-09-04 ENCOUNTER — Telehealth (HOSPITAL_COMMUNITY): Payer: Self-pay | Admitting: *Deleted

## 2022-09-04 ENCOUNTER — Other Ambulatory Visit: Payer: Self-pay | Admitting: Internal Medicine

## 2022-09-04 MED ORDER — METOPROLOL TARTRATE 100 MG PO TABS: ORAL_TABLET | ORAL | 0 refills | Status: DC

## 2022-09-04 NOTE — Telephone Encounter (Signed)
Reaching out to patient to offer assistance regarding upcoming cardiac imaging study; pt verbalizes understanding of appt date/time, parking situation and where to check in, pre-test NPO status and medications ordered, and verified current allergies; name and call back number provided for further questions should they arise  Larey Brick RN Navigator Cardiac Imaging [facility] [phone] office [phone] cell  Patient to hold her BP medications but will take 100mg  metoprolol tartrate two hours prior to her cardiac CT scan. She is aware to arrive at 3:30pm.

## 2022-09-05 ENCOUNTER — Ambulatory Visit (HOSPITAL_BASED_OUTPATIENT_CLINIC_OR_DEPARTMENT_OTHER)
Admission: RE | Admit: 2022-09-05 | Discharge: 2022-09-05 | Disposition: A | Discharge status: Home / Self Care | Payer: 59 | Source: Ambulatory Visit | Attending: Internal Medicine | Admitting: Internal Medicine

## 2022-09-05 ENCOUNTER — Ambulatory Visit (HOSPITAL_COMMUNITY)
Admission: RE | Admit: 2022-09-05 | Discharge: 2022-09-05 | Disposition: A | Discharge status: Home / Self Care | Payer: 59 | Source: Ambulatory Visit | Attending: Family Medicine | Admitting: Family Medicine

## 2022-09-05 DIAGNOSIS — R079 Chest pain, unspecified: Secondary | ICD-10-CM | POA: Insufficient documentation

## 2022-09-05 DIAGNOSIS — R9389 Abnormal findings on diagnostic imaging of other specified body structures: Secondary | ICD-10-CM | POA: Insufficient documentation

## 2022-09-05 DIAGNOSIS — I2510 Atherosclerotic heart disease of native coronary artery without angina pectoris: Secondary | ICD-10-CM

## 2022-09-05 DIAGNOSIS — R072 Precordial pain: Secondary | ICD-10-CM | POA: Diagnosis not present

## 2022-09-05 DIAGNOSIS — R931 Abnormal findings on diagnostic imaging of heart and coronary circulation: Secondary | ICD-10-CM

## 2022-09-05 MED ORDER — NITROGLYCERIN 0.4 MG SL SUBL
0.8000 mg | SUBLINGUAL_TABLET | SUBLINGUAL | Status: DC | PRN
  Administered 2022-09-05: 0.8 mg via SUBLINGUAL

## 2022-09-05 MED ORDER — NITROGLYCERIN 0.4 MG SL SUBL
SUBLINGUAL_TABLET | SUBLINGUAL | Status: AC
  Filled 2022-09-05: qty 2

## 2022-09-05 MED ORDER — IOHEXOL 350 MG/ML SOLN
100.0000 mL | Freq: Once | INTRAVENOUS | Status: AC | PRN
  Administered 2022-09-05: 100 mL via INTRAVENOUS

## 2022-09-05 NOTE — Telephone Encounter (Signed)
Requested Prescriptions  Pending Prescriptions Disp Refills   famotidine (PEPCID) 40 MG tablet [Pharmacy Med Name: FAMOTIDINE 40 MG TAB] 90 tablet 0    Sig: TAKE 1 TABLET BY MOUTH DAILY     Gastroenterology:  H2 Antagonists Passed - 09/04/2022  2:32 PM      Passed - Valid encounter within last 12 months    Recent Outpatient Visits           5 days ago Chest pain, unspecified type   [facility] [place], Salvadore Oxford, NP   1 month ago Vaginal bleeding   [place] [facility] Mecum, Oswaldo Conroy, PA-C   3 months ago Diverticulitis of large intestine without bleeding, unspecified complication status   [place] [facility] Mecum, [place] E, [place]   4 months ago Benign essential hypertension   [place] [facility] [place], Salvadore Oxford, NP       Future Appointments             In 2 months Baity, Salvadore Oxford, NP [facility], [facility]

## 2022-09-06 ENCOUNTER — Other Ambulatory Visit: Payer: Self-pay | Admitting: Internal Medicine

## 2022-09-06 NOTE — Telephone Encounter (Signed)
Future visit in 2 months.  Requested Prescriptions  Pending Prescriptions Disp Refills   simvastatin (ZOCOR) 20 MG tablet [Pharmacy Med Name: SIMVASTATIN 20 MG TAB] 90 tablet 0    Sig: TAKE 1 TABLET BY MOUTH EVERY EVENING     Cardiovascular:  Antilipid - Statins Failed - 09/06/2022 11:34 AM      Failed - Lipid Panel in normal range within the last 12 months    Cholesterol  Date Value Ref Range Status  05/08/2022 133 <200 mg/dL Final   LDL Cholesterol (Calc)  Date Value Ref Range Status  05/08/2022 63 mg/dL (calc) Final    Comment:    Reference range: <100 . Desirable range <100 mg/dL for primary prevention;   <70 mg/dL for patients with CHD or diabetic patients  with > or = 2 CHD risk factors. Marland Kitchen LDL-C is now calculated using the Martin-Hopkins  calculation, which is a validated novel method providing  better accuracy than the Friedewald equation in the  estimation of LDL-C.  Horald Pollen et al. Lenox Ahr. 1610;960(45): 2061-2068  (http://education.[facility].com/faq/FAQ164)    HDL  Date Value Ref Range Status  05/08/2022 54 > OR = 50 mg/dL Final   Triglycerides  Date Value Ref Range Status  05/08/2022 78 <150 mg/dL Final         Passed - Patient is not pregnant      Passed - Valid encounter within last 12 months    Recent Outpatient Visits           6 days ago Chest pain, unspecified type   [facility], Salvadore Oxford, NP   1 month ago Vaginal bleeding   [facility], Oswaldo Conroy, PA-C   3 months ago Diverticulitis of large intestine without bleeding, unspecified complication status   [facility] Mecum, [place], [place]   4 months ago Benign essential hypertension   [place] [facility], Salvadore Oxford, NP       Future Appointments             In 2 months Baity, Salvadore Oxford, NP [facility], [facility]

## 2022-09-07 ENCOUNTER — Telehealth: Payer: Self-pay | Admitting: Cardiology

## 2022-09-07 ENCOUNTER — Ambulatory Visit: Payer: Self-pay | Admitting: *Deleted

## 2022-09-07 NOTE — Telephone Encounter (Signed)
Patient wants a call back to discuss test results. 

## 2022-09-07 NOTE — Telephone Encounter (Signed)
Please review, I don't think you ordered the CT Scan.   Thanks,   -Vernona Rieger

## 2022-09-07 NOTE — Telephone Encounter (Signed)
Is there an alternative to lidoderm patches.  Pt states the patches are falling off right away.   Thanks,   -Vernona Rieger

## 2022-09-07 NOTE — Telephone Encounter (Signed)
Summary: CT  Scan results   Patient is requesting a callback about her CT Scan results           Chief Complaint: Results Symptoms: NA Frequency: NA Pertinent Negatives: Patient denies NA Disposition: [] ED /[] Urgent Care (no appt availability in office) / [] Appointment(In office/virtual)/ []  [place] [facility]/ [] Home Care/ [] Refused Recommended Disposition /[] [place] [facility]/ [x]  Follow-up with PCP Additional Notes:  Pt calling for CT results, not released to [facility].  Also requesting alternate to Lidoderm patches if available "Won't stick, peel off right away." Please advise. Thank you.

## 2022-09-07 NOTE — Telephone Encounter (Signed)
She needs to call cardiology. I did not order this.

## 2022-09-07 NOTE — Telephone Encounter (Signed)
The patient stated that she meant to call the office of Adele Schilder who ordered the cardiac ct.

## 2022-09-08 ENCOUNTER — Telehealth: Payer: Self-pay | Admitting: Cardiology

## 2022-09-08 MED ORDER — LIDOCAINE 5 % EX PTCH: 2.0000 | MEDICATED_PATCH | Freq: Every day | CUTANEOUS | 0 refills | Status: DC | PRN

## 2022-09-08 NOTE — Telephone Encounter (Signed)
Pt stated she'd like a callback regarding her results from her CT done on 09/05/2022. Pt stated she called yesterday and still hasn't heard back from the MD. Today I informed pt I'd send a message about her wanting a callback about her results because she's very concerned and worried. Please advise.

## 2022-09-08 NOTE — Telephone Encounter (Signed)
Pt advised.  She will continue to try and use lidoderm patches.  [facility]   Thanks,   -Vernona Rieger

## 2022-09-08 NOTE — Telephone Encounter (Signed)
Left voicemail message to call back  

## 2022-09-08 NOTE — Telephone Encounter (Signed)
There is diclofenac patches but they are essentially made out of the same stuff just anti-inflammatory instead of numbing medication.  I recommend she clean her skin with alcohol and let it air dry completely before applying the patch.

## 2022-09-11 NOTE — Telephone Encounter (Signed)
Left voicemail message to call back  

## 2022-09-12 NOTE — Telephone Encounter (Signed)
Per chart review, pt made aware of results via ordering physician Adele Schilder, FNP.

## 2022-09-13 ENCOUNTER — Other Ambulatory Visit: Payer: Self-pay | Admitting: Internal Medicine

## 2022-09-13 ENCOUNTER — Other Ambulatory Visit: Payer: Self-pay | Admitting: Otolaryngology

## 2022-09-13 DIAGNOSIS — Z1231 Encounter for screening mammogram for malignant neoplasm of breast: Secondary | ICD-10-CM

## 2022-09-13 DIAGNOSIS — E041 Nontoxic single thyroid nodule: Secondary | ICD-10-CM

## 2022-09-14 ENCOUNTER — Telehealth: Payer: Self-pay | Admitting: Internal Medicine

## 2022-09-14 NOTE — Telephone Encounter (Signed)
She sees neurology for this.  She needs to call them to discuss this.

## 2022-09-14 NOTE — Telephone Encounter (Signed)
Patient: Stacy Cunningham Phone:[phone] Provider: [name]  Spoke with patient she state that the medication for her headaches is not working.  She would like a call from the provider about there headaches and need another medication.  Please advise.

## 2022-09-19 ENCOUNTER — Ambulatory Visit: Payer: Self-pay

## 2022-09-19 NOTE — Telephone Encounter (Signed)
Message from Elon Jester sent at 09/19/2022  8:40 AM EDT  Summary: migraines   Patient called stated she is still having really bad migraines. She had a headache off and on all weekend and the propranolol (INDERAL) 20 MG tablet she was prescribed did not help any at all. Patient is requesting a stronger medication for relief         Chief Complaint: headache x 1 week   Symptoms: headache that comes and goes  Frequency: 7 days Pertinent Negatives: Patient denies fever, stiff neck  Disposition: [] ED /[] Urgent Care (no appt availability in office) / [x] Appointment(In office/virtual)/ []  [place] [facility]/ [] Home Care/ [] Refused Recommended Disposition /[] [place] [facility]/ []  Follow-up with PCP Additional Notes: pt has no transportation so VV made  Reason for Disposition  Headache is a chronic symptom (recurrent or ongoing AND present > 4 weeks)  Answer Assessment - Initial Assessment Questions 1. LOCATION: "Where does it hurt?"      Head  2. ONSET: "When did the headache start?" (Minutes, hours or days)      1 week  3. PATTERN: "Does the pain come and go, or has it been constant since it started?"     Comes and goes   4. SEVERITY: "How bad is the pain?" and "What does it keep you from doing?"  (e.g., Scale 1-10; mild, moderate, or severe)   - MILD (1-3): doesn't interfere with normal activities    - MODERATE (4-7): interferes with normal activities or awakens from sleep    - SEVERE (8-10): excruciating pain, unable to do any normal activities        moderate 5. RECURRENT SYMPTOM: "Have you ever had headaches before?" If Yes, ask: "When was the last time?" and "What happened that time?"      Yes- propanolol 2 extra strength  6. CAUSE: "What do you think is causing the headache?"     H/o migraines 7. MIGRAINE: "Have you been diagnosed with migraine headaches?" If Yes, ask: "Is this headache similar?"      Yes- yes 8. HEAD INJURY: "Has there been any recent injury to  the head?"      no 9. OTHER SYMPTOMS: "Do you have any other symptoms?" (fever, stiff neck, eye pain, sore throat, cold symptoms)     no 10. PREGNANCY: "Is there any chance you are pregnant?" "When was your last menstrual period?"       N/a  Protocols used: Headache-A-AH

## 2022-09-20 ENCOUNTER — Encounter: Payer: Self-pay | Admitting: Internal Medicine

## 2022-09-20 ENCOUNTER — Telehealth: Payer: 59 | Admitting: Internal Medicine

## 2022-09-20 NOTE — Progress Notes (Deleted)
Virtual Visit via Video Note  I connected with Stacy Cunningham on 09/20/22 at 11:20 AM EDT by a video enabled telemedicine application and verified that I am speaking with the correct person using two identifiers.  Location: Patient: *** Provider: Office  Person's participating in this video call: [name], NP-C and Luigina Oskey   I discussed the limitations of evaluation and management by telemedicine and the availability of in person appointments. The patient expressed understanding and agreed to proceed.  History of Present Illness:    Observations/Objective:   Assessment and Plan:   Follow Up Instructions:    I discussed the assessment and treatment plan with the patient. The patient was provided an opportunity to ask questions and all were answered. The patient agreed with the plan and demonstrated an understanding of the instructions.   The patient was advised to call back or seek an in-person evaluation if the symptoms worsen or if the condition fails to improve as anticipated.  I provided *** minutes of non-face-to-face time during this encounter.   [name], NP

## 2022-09-27 ENCOUNTER — Ambulatory Visit
Admission: RE | Admit: 2022-09-27 | Discharge: 2022-09-27 | Disposition: A | Discharge status: Home / Self Care | Payer: 59 | Source: Ambulatory Visit | Attending: Otolaryngology | Admitting: Otolaryngology

## 2022-09-27 DIAGNOSIS — E041 Nontoxic single thyroid nodule: Secondary | ICD-10-CM

## 2022-09-28 ENCOUNTER — Other Ambulatory Visit: Payer: 59

## 2022-09-29 ENCOUNTER — Ambulatory Visit (INDEPENDENT_AMBULATORY_CARE_PROVIDER_SITE_OTHER): Payer: 59

## 2022-09-29 VITALS — Ht 66.0 in | Wt 201.0 lb

## 2022-09-29 DIAGNOSIS — Z0000 Encounter for general adult medical examination without abnormal findings: Secondary | ICD-10-CM

## 2022-09-29 NOTE — Progress Notes (Signed)
I connected with  Stacy Cunningham on 09/29/22 by a audio enabled telemedicine application and verified that I am speaking with the correct person using two identifiers.  Patient Location: Home  Provider Location: Office/Clinic  I discussed the limitations of evaluation and management by telemedicine. The patient expressed understanding and agreed to proceed.  Subjective:   Stacy Cunningham is a 72 y.o. female who presents for an Initial Medicare Annual Wellness Visit.  Review of Systems     Cardiac Risk Factors include: advanced age (>42men, >56 women);hypertension;diabetes mellitus;dyslipidemia     Objective:    Today's Vitals   09/29/22 1408  Weight: 201 lb (91.2 kg)  Height: 5\' 6"  (1.676 m)   Body mass index is 32.44 kg/m.     09/29/2022    2:01 PM 06/19/2022    8:28 AM 02/15/2022    6:57 AM 11/24/2020   12:41 PM 11/09/2020    1:50 PM 09/30/2018   10:33 AM 09/18/2018   10:16 AM  Advanced Directives  Does Patient Have a Medical Advance Directive? No No No No No No No  Would patient like information on creating a medical advance directive? No - Patient declined  No - Patient declined  No - Patient declined No - Patient declined     Current Medications (verified) Outpatient Encounter Medications as of 09/29/2022  Medication Sig   Accu-Chek Softclix Lancets lancets    acetaminophen (TYLENOL) 500 MG tablet Take 1,000 mg by mouth 2 (two) times daily as needed for moderate pain or headache.   aspirin 81 MG EC tablet Take 81 mg by mouth daily.   Blood Glucose Monitoring Suppl DEVI Use to check blood sugar once a day.  DX: E11.9. May substitute to any manufacturer covered by patient's insurance.   brimonidine (ALPHAGAN) 0.15 % ophthalmic solution Place 1 drop into both eyes 3 (three) times daily.   clotrimazole-betamethasone (LOTRISONE) cream Apply topically 2 (two) times daily.   COMBIGAN 0.2-0.5 % ophthalmic solution Apply 1 drop to eye 2 (two) times daily.   diclofenac Sodium  (VOLTAREN) 1 % GEL Apply 4 g topically 4 (four) times daily.   dorzolamide-timolol (COSOPT) 22.3-6.8 MG/ML ophthalmic solution Place 1 drop into both eyes at bedtime.   famotidine (PEPCID) 40 MG tablet TAKE 1 TABLET BY MOUTH DAILY   ferrous sulfate 325 (65 FE) MG tablet Take 1 tablet (325 mg total) by mouth 2 (two) times daily with a meal.   gabapentin (NEURONTIN) 800 MG tablet Take 800 mg by mouth 3 (three) times daily.   Glucose Blood (BLOOD GLUCOSE TEST STRIPS) STRP Use to check blood sugar once a day.  DX: E11.9. May substitute to any manufacturer covered by patient's insurance.   hydrochlorothiazide (HYDRODIURIL) 25 MG tablet TAKE 1 TABLET BY MOUTH DAILY   ibuprofen (ADVIL) 800 MG tablet Take 800 mg by mouth every 8 (eight) hours as needed.   Lancets Misc. MISC Use to check blood sugar once a day.  DX: E11.9. May substitute to any manufacturer covered by patient's insurance.   lidocaine (LIDODERM) 5 % Place 2 patches onto the skin daily as needed (pain). Remove & Discard patch within 12 hours or as directed by MD   lisinopril (ZESTRIL) 2.5 MG tablet TAKE 1 TABLET BY MOUTH DAILY FOR KIDNEY PROTECTION   Magnesium Oxide 400 MG CAPS Take 1 capsule (400 mg total) by mouth daily with breakfast.   metFORMIN (GLUCOPHAGE) 500 MG tablet Take 2 tablets (1,000 mg total) by mouth daily with breakfast. And 500mg   in the evening   metoprolol tartrate (LOPRESSOR) 100 MG tablet Take tablet (100mg ) TWO hours prior to your cardiac CT scan.   omeprazole (PRILOSEC) 20 MG capsule Take 1 capsule (20 mg total) by mouth daily.   propranolol (INDERAL) 20 MG tablet Take 20 mg by mouth 2 (two) times daily.   simvastatin (ZOCOR) 20 MG tablet TAKE 1 TABLET BY MOUTH EVERY EVENING   VIGAMOX 0.5 % ophthalmic solution Place 1 drop into the right eye 4 (four) times daily.   nortriptyline (PAMELOR) 10 MG capsule Take 10 mg by mouth 2 (two) times daily. (Patient not taking: Reported on 09/29/2022)   SUMAtriptan (IMITREX) 100 MG  tablet Take by mouth. (Patient not taking: Reported on 09/29/2022)   No facility-administered encounter medications on file as of 09/29/2022.    Allergies (verified) Sulfa antibiotics, Darvon [propoxyphene], Other, and Tape   History: Past Medical History:  Diagnosis Date   Anemia    Borderline diabetes mellitus    Diabetes mellitus without complication (HCC)    Elevated lipids    GERD (gastroesophageal reflux disease)    Glaucoma    Headache    h/o migraines   Hypertension    Neuropathy    Palpitations    a. 05/2020 Zio: Predominantly sinus rhythm, 80 (60-148).  1 SVT run-4 beats at 148.  Rare PACs and PVCs; b. 04/2021 Zio: Predominantly sinus rhythm, 80 (59-140).  Rare PACs/PVCs.   Panic attack    Past Surgical History:  Procedure Laterality Date   ANTERIOR CERVICAL DECOMP/DISCECTOMY FUSION N/A 09/30/2018   Procedure: ANTERIOR CERVICAL DECOMPRESSION/DISCECTOMY FUSION 1 LEVEL - C 5/6;  Surgeon: Lucy Chris, MD;  Location: [facility];  Service: Neurosurgery;  Laterality: N/A;   CATARACT EXTRACTION W/PHACO Right 02/15/2022   Procedure: CATARACT EXTRACTION PHACO AND INTRAOCULAR LENS PLACEMENT (IOC) RIGHT KAHOOK DUAL BLADE GONIOTOMY DIABETIC 6.21 01:05.9;  Surgeon: Lockie Mola, MD;  Location: [facility];  Service: Ophthalmology;  Laterality: Right;   CHOLECYSTECTOMY N/A 02/06/2018   Procedure: LAPAROSCOPIC CHOLECYSTECTOMY;  Surgeon: Carolan Shiver, MD;  Location: [facility];  Service: General;  Laterality: N/A;   COLONOSCOPY WITH PROPOFOL N/A 07/13/2017   Procedure: COLONOSCOPY WITH PROPOFOL;  Surgeon: Pasty Spillers, MD;  Location: [facility];  Service: Endoscopy;  Laterality: N/A;   COLONOSCOPY WITH PROPOFOL N/A 06/19/2022   Procedure: COLONOSCOPY WITH PROPOFOL;  Surgeon: Wyline Mood, MD;  Location: [facility];  Service: Gastroenterology;  Laterality: N/A;   ESOPHAGOGASTRODUODENOSCOPY (EGD) WITH PROPOFOL N/A 09/14/2017   Procedure:  ESOPHAGOGASTRODUODENOSCOPY (EGD) WITH PROPOFOL;  Surgeon: Pasty Spillers, MD;  Location: [facility];  Service: Endoscopy;  Laterality: N/A;   Family History  Problem Relation Age of Onset   Breast cancer Sister 32   Heart attack Mother 94   Hyperlipidemia Mother    Hypertension Mother    Social History   Socioeconomic History   Marital status: Married    Spouse name: Not on file   Number of children: Not on file   Years of education: Not on file   Highest education level: Not on file  Occupational History   Not on file  Tobacco Use   Smoking status: Never   Smokeless tobacco: Never  Vaping Use   Vaping Use: Never used  Substance and Sexual Activity   Alcohol use: Not Currently    Comment: rare   Drug use: No   Sexual activity: Yes  Other Topics Concern   Not on file  Social History Narrative   Not on file  Social Determinants of Health   Financial Resource Strain: Low Risk  (09/29/2022)   Overall Financial Resource Strain (CARDIA)    Difficulty of Paying Living Expenses: Not hard at all  Food Insecurity: No Food Insecurity (09/29/2022)   Hunger Vital Sign    Worried About Running Out of Food in the Last Year: Never true    Ran Out of Food in the Last Year: Never true  Transportation Needs: No Transportation Needs (09/29/2022)   PRAPARE - Administrator, Civil Service (Medical): No    Lack of Transportation (Non-Medical): No  Physical Activity: Insufficiently Active (09/29/2022)   Exercise Vital Sign    Days of Exercise per Week: 3 days    Minutes of Exercise per Session: 10 min  Stress: No Stress Concern Present (09/29/2022)   [employer] of Occupational Health - Occupational Stress Questionnaire    Feeling of Stress : Not at all  Social Connections: Moderately Integrated (09/29/2022)   Social Connection and Isolation Panel [NHANES]    Frequency of Communication with Friends and Family: More than three times a week    Frequency of Social  Gatherings with Friends and Family: More than three times a week    Attends Religious Services: More than 4 times per year    Active Member of [employer] or Organizations: No    Attends Engineer, structural: Never    Marital Status: Married    Tobacco Counseling Counseling given: Not Answered   Clinical Intake:  Pre-visit preparation completed: Yes  Pain : No/denies pain     Nutritional Risks: None Diabetes: Yes CBG done?: No Did pt. bring in CBG monitor from home?: No  How often do you need to have someone help you when you read instructions, pamphlets, or other written materials from your doctor or pharmacy?: 1 - Never  Diabetic?yes Nutrition Risk Assessment:  Has the patient had any N/V/D within the last 2 months?  No  Does the patient have any non-healing wounds?  No  Has the patient had any unintentional weight loss or weight gain?  No   Diabetes:  Is the patient diabetic?  Yes  If diabetic, was a CBG obtained today?  No  Did the patient bring in their glucometer from home?  No  How often do you monitor your CBG's? never.   Financial Strains and Diabetes Management:  Are you having any financial strains with the device, your supplies or your medication? No .  Does the patient want to be seen by Chronic Care Management for management of their diabetes?  No  Would the patient like to be referred to a Nutritionist or for Diabetic Management?  No   Diabetic Exams:  Diabetic Eye Exam: Completed no. Overdue for diabetic eye exam. Pt has been advised about the importance in completing this exam. .  Diabetic Foot Exam: Completed ORDERED 05/08/22. Pt has been advised about the importance in completing this exam.    Interpreter Needed?: No  Information entered by :: Kennedy Bucker, LPN   Activities of Daily Living    09/29/2022    2:01 PM 07/26/2022   11:36 AM  In your present state of health, do you have any difficulty performing the following activities:   Hearing? 0 0  Vision? 0 0  Difficulty concentrating or making decisions? 0 0  Walking or climbing stairs? 1 0  Dressing or bathing? 0 0  Doing errands, shopping? 0 0  Preparing Food and eating ? N  Using the Toilet? N   In the past six months, have you accidently leaked urine? N   Do you have problems with loss of bowel control? N   Managing your Medications? N   Managing your Finances? N   Housekeeping or managing your Housekeeping? N     Patient Care Team: Lorre Munroe, NP as PCP - General (Internal Medicine) Debbe Odea, MD as PCP - Cardiology (Cardiology)  Indicate any recent Medical Services you may have received from other than [facility] providers in the past year (date may be approximate).     Assessment:   This is a routine wellness examination for Stacy Cunningham.  Hearing/Vision screen Hearing Screening - Comments:: No aids Vision Screening - Comments:: Wears glasses- Dr. Inez Pilgrim  Dietary issues and exercise activities discussed: Current Exercise Habits: Home exercise routine, Type of exercise: walking, Time (Minutes): 10, Frequency (Times/Week): 3, Weekly Exercise (Minutes/Week): 30, Intensity: Mild   Goals Addressed             This Visit's Progress    DIET - EAT MORE FRUITS AND VEGETABLES         Depression Screen    09/29/2022    2:00 PM 07/26/2022   11:36 AM 05/08/2022   10:45 AM 11/24/2020   12:40 PM 11/09/2020    1:49 PM  PHQ 2/9 Scores  PHQ - 2 Score 0 0 3 0 0  PHQ- 9 Score 0 0 8      Fall Risk    09/29/2022    2:01 PM 07/26/2022   11:36 AM 05/08/2022   10:46 AM 11/24/2020   12:40 PM 11/09/2020    1:49 PM  Fall Risk   Falls in the past year? 0 0 0 0 0  Number falls in past yr: 0 0     Injury with Fall? 0 0 0    Risk for fall due to : No Fall Risks No Fall Risks     Follow up Falls prevention discussed;Falls evaluation completed Falls prevention discussed;Education provided;Falls evaluation completed       FALL RISK PREVENTION PERTAINING TO  THE HOME:  Any stairs in or around the home? No  If so, are there any without handrails? No  Home free of loose throw rugs in walkways, pet beds, electrical cords, etc? Yes  Adequate lighting in your home to reduce risk of falls? Yes   ASSISTIVE DEVICES UTILIZED TO PREVENT FALLS:  Life alert? No  Use of a cane, walker or w/c? Yes - CANE SOMETIMES Grab bars in the bathroom? No  Shower chair or bench in shower? No  Elevated toilet seat or a handicapped toilet? No    Cognitive Function:        09/29/2022    2:07 PM  6CIT Screen  What Year? 0 points  What month? 0 points  What time? 0 points  Count back from 20 0 points  Months in reverse 0 points  Repeat phrase 0 points  Total Score 0 points    Immunizations Immunization History  Administered Date(s) Administered   Influenza, Seasonal, Injecte, Preservative Fre 03/29/2006, 03/08/2007   Influenza,inj,Quad PF,6+ Mos 02/02/2019   Influenza-Unspecified 01/19/2022   Moderna Covid-19 Vaccine Bivalent Booster 53yrs & up 01/31/2021, 10/20/2021   Moderna SARS-COV2 Booster Vaccination 03/08/2020, 08/16/2020   Moderna Sars-Covid-2 Vaccination 05/29/2019, 06/26/2019   Pneumococcal Conjugate-13 02/16/2016   Pneumococcal Polysaccharide-23 12/28/2004, 07/23/2014   Tdap 06/24/2018, 12/11/2018    TDAP status: Up to date  Flu Vaccine status: Up  to date  Pneumococcal vaccine status: Up to date  Covid-19 vaccine status: Completed vaccines  Qualifies for Shingles Vaccine? Yes   Zostavax completed No   Shingrix Completed?: No.    Education has been provided regarding the importance of this vaccine. Patient has been advised to call insurance company to determine out of pocket expense if they have not yet received this vaccine. Advised may also receive vaccine at local pharmacy or Health Dept. Verbalized acceptance and understanding.  Screening Tests Health Maintenance  Topic Date Due   FOOT EXAM  Never done   OPHTHALMOLOGY EXAM   Never done   Zoster Vaccines- Shingrix (1 of 2) Never done   Pneumonia Vaccine 70+ Years old (3 of 3 - PPSV23 or PCV20) 02/15/2021   COVID-19 Vaccine (7 - 2023-24 season) 12/23/2021   HEMOGLOBIN A1C  11/06/2022   INFLUENZA VACCINE  11/23/2022   Diabetic kidney evaluation - eGFR measurement  05/09/2023   Diabetic kidney evaluation - Urine ACR  05/09/2023   Medicare Annual Wellness (AWV)  09/29/2023   MAMMOGRAM  12/01/2023   DTaP/Tdap/Td (3 - Td or Tdap) 12/10/2028   Colonoscopy  06/19/2032   DEXA SCAN  Completed   Hepatitis C Screening  Completed   HPV VACCINES  Aged Out    Health Maintenance  Health Maintenance Due  Topic Date Due   FOOT EXAM  Never done   OPHTHALMOLOGY EXAM  Never done   Zoster Vaccines- Shingrix (1 of 2) Never done   Pneumonia Vaccine 12+ Years old (3 of 3 - PPSV23 or PCV20) 02/15/2021   COVID-19 Vaccine (7 - 2023-24 season) 12/23/2021    Colorectal cancer screening: Type of screening: Colonoscopy. Completed 06/19/22. Repeat every 10 years  Mammogram status: Completed SCHEDULED FOR 12/04/22. Repeat every year  Bone Density status: Completed 11/29/20. Results reflect: Bone density results: NORMAL. Repeat every 5 years.  Lung Cancer Screening: (Low Dose CT Chest recommended if Age 54-80 years, 30 pack-year currently smoking OR have quit w/in 15years.) does not qualify.   Additional Screening:  Hepatitis C Screening: does qualify; Completed 05/08/22  Vision Screening: Recommended annual ophthalmology exams for early detection of glaucoma and other disorders of the eye. Is the patient up to date with their annual eye exam?  Yes  Who is the provider or what is the name of the office in which the patient attends annual eye exams? Dr.Brasington If pt is not established with a provider, would they like to be referred to a provider to establish care? No .   Dental Screening: Recommended annual dental exams for proper oral hygiene  Community Resource Referral /  Chronic Care Management: CRR required this visit?  No   CCM required this visit?  No      Plan:     I have personally reviewed and noted the following in the patient's chart:   Medical and social history Use of alcohol, tobacco or illicit drugs  Current medications and supplements including opioid prescriptions. Patient is not currently taking opioid prescriptions. Functional ability and status Nutritional status Physical activity Advanced directives List of other physicians Hospitalizations, surgeries, and ER visits in previous 12 months Vitals Screenings to include cognitive, depression, and falls Referrals and appointments  In addition, I have reviewed and discussed with patient certain preventive protocols, quality metrics, and best practice recommendations. A written personalized care plan for preventive services as well as general preventive health recommendations were provided to patient.     Hal Hope, LPN  09/29/2022   Nurse Notes: none

## 2022-10-09 ENCOUNTER — Ambulatory Visit: Payer: 59 | Admitting: Podiatry

## 2022-10-11 ENCOUNTER — Ambulatory Visit: Payer: 59 | Admitting: Podiatry

## 2022-10-16 ENCOUNTER — Ambulatory Visit: Payer: Self-pay

## 2022-10-16 NOTE — Telephone Encounter (Signed)
  Chief Complaint: Med list review Symptoms:  Frequency:  Pertinent Negatives: Patient denies  Disposition: [] ED /[] Urgent Care (no appt availability in office) / [] Appointment(In office/virtual)/ []  [place] [facility]/ [] Home Care/ [] Refused Recommended Disposition /[] [place] [facility]/ [x]  Follow-up with PCP Additional Notes: Pt wanted to review current meds. Pt gave list of medications she is currently taking. She states that all other meds should be removed.  Current meds are:  Dorzolamide - Timolol - eye drop Brimonidine - eye drop Rocklatan - eye drop Eye drops are managed by ophthalmologist.  Ipratropium - nasal spray Clotrimazole Cream - Pt needs this refilled Iron 325 Omeprazole 20 Gabapentin 800mg  Metformin 500 Propanolol  - pt states she has 10mg  tablets. Med list shows 20 mg. Pt states she needs the 20 mg tablets as the 10 mg do nothing. Hydrochlorothiazide 25mg  Simvastatin Lisinopril 2.5 Aspirin 81 mg Famotidine 40 Pamelor 10 mg Allegra 180 mg 24 hour  - needs refilled. - not on med list.  Please remove any other medications.     Summary: pt wants more meds added to current list.. without appt, request nurse to call   Pt is stating she wants Rene Kocher to take over all her meds and wants a nurse to call her by to go over meds, offered appt,  pt denied does has CPE 7/15 Pt is wanting dr to take over all med, some meds not on latest list but requesting even as historical, wants a fu call from the nurse. States PCP is required to call in any meds as she is now her PCP  [phone]     Reason for Disposition  [1] Caller has NON-URGENT medicine question about med that PCP prescribed AND [2] triager unable to answer question  Answer Assessment - Initial Assessment Questions 1. NAME of MEDICINE: "What medicine(s) are you calling about?"     Please see list  Protocols used: Medication Question Call-A-AH

## 2022-10-17 MED ORDER — PROPRANOLOL HCL 20 MG PO TABS: 20.0000 mg | ORAL_TABLET | Freq: Two times a day (BID) | ORAL | 0 refills | Status: DC

## 2022-10-17 MED ORDER — FEXOFENADINE HCL 180 MG PO TABS: 180.0000 mg | ORAL_TABLET | Freq: Every day | ORAL | 0 refills | Status: AC

## 2022-10-17 MED ORDER — CLOTRIMAZOLE-BETAMETHASONE 1-0.05 % EX CREA: TOPICAL_CREAM | Freq: Two times a day (BID) | CUTANEOUS | 0 refills | Status: DC

## 2022-10-17 NOTE — Telephone Encounter (Signed)
Medication list updated and refill sent to pharmacy

## 2022-10-17 NOTE — Addendum Note (Signed)
Addended by: Lorre Munroe on: 10/17/2022 07:10 AM   Modules accepted: Orders

## 2022-10-25 ENCOUNTER — Ambulatory Visit: Payer: 59 | Admitting: Podiatry

## 2022-11-03 ENCOUNTER — Other Ambulatory Visit: Payer: Self-pay | Admitting: Internal Medicine

## 2022-11-03 NOTE — Telephone Encounter (Signed)
Requested medication (s) are due for refill today:yes  Requested medication (s) are on the active medication list:yes  Last refill:  08/04/22 #90   Future visit scheduled: yes  Notes to clinic:  high no show/cancel rate- wasn not sure if to give 3 day refill or not. Please review.    Requested Prescriptions  Pending Prescriptions Disp Refills   lisinopril (ZESTRIL) 2.5 MG tablet [Pharmacy Med Name: LISINOPRIL 2.5 MG TAB] 90 tablet 0    Sig: TAKE 1 TABLET BY MOUTH DAILY FOR KIDNEY PROTECTION     Cardiovascular:  ACE Inhibitors Passed - 11/03/2022  1:00 PM      Passed - Cr in normal range and within 180 days    Creat  Date Value Ref Range Status  05/08/2022 0.74 0.50 - 1.05 mg/dL Final   Creatinine, Urine  Date Value Ref Range Status  05/08/2022 53 20 - 275 mg/dL Final         Passed - K in normal range and within 180 days    Potassium  Date Value Ref Range Status  05/08/2022 4.5 3.5 - 5.3 mmol/L Final         Passed - Patient is not pregnant      Passed - Last BP in normal range    BP Readings from Last 1 Encounters:  09/05/22 96/69         Passed - Valid encounter within last 6 months    Recent Outpatient Visits           2 months ago Chest pain, unspecified type   [place] [facility] [place], Salvadore Oxford, NP   3 months ago Vaginal bleeding   [facility], Erin E, PA-C   5 months ago Diverticulitis of large intestine without bleeding, unspecified complication status   [place] [facility], Larned E, [place]   5 months ago Benign essential hypertension   [place] [facility] [place], Salvadore Oxford, NP       Future Appointments             In 3 days [place], Salvadore Oxford, NP [facility], [facility]

## 2022-11-06 ENCOUNTER — Encounter: Payer: 59 | Admitting: Internal Medicine

## 2022-11-06 NOTE — Progress Notes (Deleted)
Subjective:    Patient ID: Stacy Cunningham, female    DOB: 06-26-1950, 72 y.o.   MRN: [id number]  HPI  Patient presents to clinic today for her annual exam.  Flu: 12/2021 Tetanus: 11/2018 COVID: X 6 Pneumovax: 06/2014 Prevnar: 01/2016 Shingrix: Pap smear: Mammogram: 11/2021 Bone density: 11/2020 Colon screening: 05/2022 Vision screening: Dentist:  Diet: Exercise:   Review of Systems  Past Medical History:  Diagnosis Date   Anemia    Borderline diabetes mellitus    Diabetes mellitus without complication (HCC)    Elevated lipids    GERD (gastroesophageal reflux disease)    Glaucoma    Headache    h/o migraines   Hypertension    Neuropathy    Palpitations    a. 05/2020 Zio: Predominantly sinus rhythm, 80 (60-148).  1 SVT run-4 beats at 148.  Rare PACs and PVCs; b. 04/2021 Zio: Predominantly sinus rhythm, 80 (59-140).  Rare PACs/PVCs.   Panic attack     Current Outpatient Medications  Medication Sig Dispense Refill   aspirin 81 MG EC tablet Take 81 mg by mouth daily.     brimonidine (ALPHAGAN) 0.15 % ophthalmic solution Place 1 drop into both eyes 3 (three) times daily.     clotrimazole-betamethasone (LOTRISONE) cream Apply topically 2 (two) times daily. 30 g 0   COMBIGAN 0.2-0.5 % ophthalmic solution Apply 1 drop to eye 2 (two) times daily.     dorzolamide-timolol (COSOPT) 22.3-6.8 MG/ML ophthalmic solution Place 1 drop into both eyes at bedtime.     famotidine (PEPCID) 40 MG tablet TAKE 1 TABLET BY MOUTH DAILY 90 tablet 0   ferrous sulfate 325 (65 FE) MG tablet Take 1 tablet (325 mg total) by mouth 2 (two) times daily with a meal. 180 tablet 1   fexofenadine (ALLEGRA ALLERGY) 180 MG tablet Take 1 tablet (180 mg total) by mouth daily. 90 tablet 0   gabapentin (NEURONTIN) 800 MG tablet Take 800 mg by mouth 3 (three) times daily.     hydrochlorothiazide (HYDRODIURIL) 25 MG tablet TAKE 1 TABLET BY MOUTH DAILY 90 tablet 0   lisinopril (ZESTRIL) 2.5 MG tablet TAKE 1 TABLET BY  MOUTH DAILY FOR KIDNEY PROTECTION 90 tablet 0   metFORMIN (GLUCOPHAGE) 500 MG tablet Take 2 tablets (1,000 mg total) by mouth daily with breakfast. And 500mg  in the evening 270 tablet 0   nortriptyline (PAMELOR) 10 MG capsule Take 10 mg by mouth 2 (two) times daily. (Patient not taking: Reported on 09/29/2022)     omeprazole (PRILOSEC) 20 MG capsule Take 1 capsule (20 mg total) by mouth daily. 90 capsule 1   propranolol (INDERAL) 20 MG tablet Take 1 tablet (20 mg total) by mouth 2 (two) times daily. 180 tablet 0   simvastatin (ZOCOR) 20 MG tablet TAKE 1 TABLET BY MOUTH EVERY EVENING 90 tablet 0   VIGAMOX 0.5 % ophthalmic solution Place 1 drop into the right eye 4 (four) times daily.     No current facility-administered medications for this visit.    Allergies  Allergen Reactions   Sulfa Antibiotics Dermatitis   Darvon [Propoxyphene] Hives   Other Other (See Comments) and Rash    PAPER TAPE OK TO USE PAPER TAPE OK TO USE    Tape Rash    PAPER TAPE OK TO USE    Family History  Problem Relation Age of Onset   Breast cancer Sister 46   Heart attack Mother 53   Hyperlipidemia Mother    Hypertension Mother  Social History   Socioeconomic History   Marital status: Married    Spouse name: Not on file   Number of children: Not on file   Years of education: Not on file   Highest education level: Not on file  Occupational History   Not on file  Tobacco Use   Smoking status: Never   Smokeless tobacco: Never  Vaping Use   Vaping status: Never Used  Substance and Sexual Activity   Alcohol use: Not Currently    Comment: rare   Drug use: No   Sexual activity: Yes  Other Topics Concern   Not on file  Social History Narrative   Not on file   Social Determinants of Health   Financial Resource Strain: Low Risk  (09/29/2022)   Overall Financial Resource Strain (CARDIA)    Difficulty of Paying Living Expenses: Not hard at all  Food Insecurity: No Food Insecurity (09/29/2022)    Hunger Vital Sign    Worried About Running Out of Food in the Last Year: Never true    Ran Out of Food in the Last Year: Never true  Transportation Needs: No Transportation Needs (09/29/2022)   PRAPARE - Administrator, Civil Service (Medical): No    Lack of Transportation (Non-Medical): No  Physical Activity: Insufficiently Active (09/29/2022)   Exercise Vital Sign    Days of Exercise per Week: 3 days    Minutes of Exercise per Session: 10 min  Stress: No Stress Concern Present (09/29/2022)   [employer] of Occupational Health - Occupational Stress Questionnaire    Feeling of Stress : Not at all  Social Connections: Moderately Integrated (09/29/2022)   Social Connection and Isolation Panel [NHANES]    Frequency of Communication with Friends and Family: More than three times a week    Frequency of Social Gatherings with Friends and Family: More than three times a week    Attends Religious Services: More than 4 times per year    Active Member of [employer] or Organizations: No    Attends Banker Meetings: Never    Marital Status: Married  Catering manager Violence: Not At Risk (09/29/2022)   Humiliation, Afraid, Rape, and Kick questionnaire    Fear of Current or Ex-Partner: No    Emotionally Abused: No    Physically Abused: No    Sexually Abused: No     Constitutional: Patient reports intermittent headaches.  Denies fever, malaise, fatigue, or abrupt weight changes.  HEENT: Denies eye pain, eye redness, ear pain, ringing in the ears, wax buildup, runny nose, nasal congestion, bloody nose, or sore throat. Respiratory: Denies difficulty breathing, shortness of breath, cough or sputum production.   Cardiovascular: Denies chest pain, chest tightness, palpitations or swelling in the hands or feet.  Gastrointestinal: Denies abdominal pain, bloating, constipation, diarrhea or blood in the stool.  GU: Denies urgency, frequency, pain with urination, burning sensation, blood  in urine, odor or discharge. Musculoskeletal: Patient reports chronic back pain.  Denies decrease in range of motion, difficulty with gait, or joint swelling.  Skin: Denies redness, rashes, lesions or ulcercations.  Neurological: Denies dizziness, difficulty with memory, difficulty with speech or problems with balance and coordination.  Psych: Denies anxiety, depression, SI/HI.  No other specific complaints in a complete review of systems (except as listed in HPI above).     Objective:   Physical Exam   There were no vitals taken for this visit. Wt Readings from Last 3 Encounters:  09/29/22 201 lb (  91.2 kg)  08/31/22 201 lb (91.2 kg)  08/03/22 201 lb (91.2 kg)    General: Appears their stated age, well developed, well nourished in NAD. Skin: Warm, dry and intact. No rashes, lesions or ulcerations noted. HEENT: Head: normal shape and size; Eyes: sclera white, no icterus, conjunctiva pink, PERRLA and EOMs intact; Ears: Tm's gray and intact, normal light reflex; Nose: mucosa pink and moist, septum midline; Throat/Mouth: Teeth present, mucosa pink and moist, no exudate, lesions or ulcerations noted.  Neck:  Neck supple, trachea midline. No masses, lumps or thyromegaly present.  Cardiovascular: Normal rate and rhythm. S1,S2 noted.  No murmur, rubs or gallops noted. No JVD or BLE edema. No carotid bruits noted. Pulmonary/Chest: Normal effort and positive vesicular breath sounds. No respiratory distress. No wheezes, rales or ronchi noted.  Abdomen: Soft and nontender. Normal bowel sounds. No distention or masses noted. Liver, spleen and kidneys non palpable. Musculoskeletal: Normal range of motion. No signs of joint swelling. No difficulty with gait.  Neurological: Alert and oriented. Cranial nerves II-XII grossly intact. Coordination normal.  Psychiatric: Mood and affect normal. Behavior is normal. Judgment and thought content normal.     BMET    Component Value Date/Time   NA 141  05/08/2022 1040   K 4.5 05/08/2022 1040   CL 103 05/08/2022 1040   CO2 31 05/08/2022 1040   GLUCOSE 120 (H) 05/08/2022 1040   BUN 14 05/08/2022 1040   CREATININE 0.74 05/08/2022 1040   CALCIUM 9.5 05/08/2022 1040   GFRNONAA >60 09/18/2018 1030   GFRAA >60 09/18/2018 1030    Lipid Panel     Component Value Date/Time   CHOL 133 05/08/2022 1040   TRIG 78 05/08/2022 1040   HDL 54 05/08/2022 1040   CHOLHDL 2.5 05/08/2022 1040   LDLCALC 63 05/08/2022 1040    CBC    Component Value Date/Time   WBC 7.9 05/08/2022 1040   RBC 5.07 05/08/2022 1040   HGB 11.4 (L) 05/08/2022 1040   HCT 36.1 05/08/2022 1040   PLT 271 05/08/2022 1040   MCV 71.2 (L) 05/08/2022 1040   MCH 22.5 (L) 05/08/2022 1040   MCHC 31.6 (L) 05/08/2022 1040   RDW 16.4 (H) 05/08/2022 1040   LYMPHSABS 2.4 06/05/2018 0608   MONOABS 0.5 06/05/2018 0608   EOSABS 0.1 06/05/2018 0608   BASOSABS 0.1 06/05/2018 0608    Hgb A1C Lab Results  Component Value Date   HGBA1C 7.0 (H) 05/08/2022           Assessment & Plan:   Preventative health maintenance:  Encouraged her to get a flu shot in the fall Tetanus UTD COVID seen UTD Prevnar 20 today Discussed Shingrix vaccine, she will check coverage with her insurance company and schedule visit she would like to have this done She no longer needs to screen for cervical cancer given her age Mammogram has already been scheduled Pregnancy UTD Colon screening UTD Encouraged her to consume a balanced diet and exercise regimen Advised to see an eye doctor and dentist annually We will check CBC, c-Met, lipid, A1c today  RTC in 6 months, follow-up chronic conditions [name], NP

## 2022-11-08 ENCOUNTER — Other Ambulatory Visit: Payer: Self-pay | Admitting: Internal Medicine

## 2022-11-09 NOTE — Telephone Encounter (Signed)
Requested Prescriptions  Pending Prescriptions Disp Refills   omeprazole (PRILOSEC) 20 MG capsule [Pharmacy Med Name: OMEPRAZOLE DR 20 MG CAP] 90 capsule 0    Sig: TAKE 1 CAPSULE BY MOUTH DAILY     Gastroenterology: Proton Pump Inhibitors Passed - 11/08/2022  2:59 PM      Passed - Valid encounter within last 12 months    Recent Outpatient Visits           2 months ago Chest pain, unspecified type   [place] [facility] [place], Salvadore Oxford, NP   3 months ago Vaginal bleeding   [place] [facility] M[facility]um, Erin E, PA-C   6 months ago Diverticulitis of large intestine without bleeding, unspecified complication status   [place] [facility] Mecum, Nina E, [place]   6 months ago Benign essential hypertension   [place] [facility] [place], Salvadore Oxford, NP       Future Appointments             In 4 weeks Carman, Salvadore Oxford, NP [place] [facility], [facility]

## 2022-11-21 ENCOUNTER — Other Ambulatory Visit: Payer: Self-pay | Admitting: Internal Medicine

## 2022-11-22 ENCOUNTER — Ambulatory Visit: Payer: 59 | Admitting: Podiatry

## 2022-11-22 ENCOUNTER — Encounter: Payer: Self-pay | Admitting: Podiatry

## 2022-11-22 DIAGNOSIS — E1142 Type 2 diabetes mellitus with diabetic polyneuropathy: Secondary | ICD-10-CM | POA: Diagnosis not present

## 2022-11-22 MED ORDER — GABAPENTIN 800 MG PO TABS: ORAL_TABLET | ORAL | 3 refills | Status: DC

## 2022-11-22 NOTE — Progress Notes (Signed)
She presents today concerned about increased burning in her feet at nighttime.  She states that she is taking 800 mg of gabapentin 3 times daily states that she has noticed some swelling of the ankles.  Has not recently had an A1c done.  Objective: Vital signs are stable alert and oriented x 3.  Pulses are palpable.  This seemed to her history I do feel that she has some fasciitis and obviously diabetic peripheral neuropathy.  Assessment: Pain in limb secondary to diabetic peripheral neuropathy.  Plan: Discussed etiology pathology and surgical therapies I think the first thing we need to do is to increase her gabapentin to 1600 mg at nighttime so she will be maxing out at 3.2 g/day.  We did discuss that if this fail to alleviate her symptomatology may need to consider injections to the bilateral heels.

## 2022-11-22 NOTE — Telephone Encounter (Signed)
Medication D/C 10/17/22 Requested Prescriptions  Pending Prescriptions Disp Refills   lidocaine (LIDODERM) 5 % [Pharmacy Med Name: LIDOCAINE 5% TOP PAT]      Sig: PLACE 2 PATCHES ONTO THE SKIN DAILY AS NEEDED FOR PAIN. REMOVE AND DISCARD PATCH WITHIN 12 HOURS OR AS DIRECTED BY MD.     Analgesics:  Topicals Failed - 11/21/2022  1:02 PM      Failed - Manual Review: Labs are only required if the patient has taken medication for more than 8 weeks.      Failed - HGB in normal range and within 360 days    Hemoglobin  Date Value Ref Range Status  05/08/2022 11.4 (L) 11.7 - 15.5 g/dL Final         Passed - PLT in normal range and within 360 days    Platelets  Date Value Ref Range Status  05/08/2022 271 140 - 400 Thousand/uL Final         Passed - HCT in normal range and within 360 days    HCT  Date Value Ref Range Status  05/08/2022 36.1 35.0 - 45.0 % Final         Passed - Cr in normal range and within 360 days    Creat  Date Value Ref Range Status  05/08/2022 0.74 0.50 - 1.05 mg/dL Final   Creatinine, Urine  Date Value Ref Range Status  05/08/2022 53 20 - 275 mg/dL Final         Passed - eGFR is 30 or above and within 360 days    GFR calc Af Amer  Date Value Ref Range Status  09/18/2018 >60 >60 mL/min Final   GFR calc non Af Amer  Date Value Ref Range Status  09/18/2018 >60 >60 mL/min Final   eGFR  Date Value Ref Range Status  05/08/2022 88 > OR = 60 mL/min/1.17m2 Final         Passed - Patient is not pregnant      Passed - Valid encounter within last 12 months    Recent Outpatient Visits           2 months ago Chest pain, unspecified type   [place] [facility] [place], Salvadore Oxford, NP   3 months ago Vaginal bleeding   [place] [facility] Mecum, Erin E, PA-C   6 months ago Diverticulitis of large intestine without bleeding, unspecified complication status   [place] [facility] Mecum, Vadito E, [place]   6 months  ago Benign essential hypertension   [place] [facility] [place], Salvadore Oxford, NP       Future Appointments             In 2 weeks Sampson Si, Salvadore Oxford, NP [place] [facility], [facility]

## 2022-11-29 ENCOUNTER — Other Ambulatory Visit: Payer: Self-pay | Admitting: Internal Medicine

## 2022-11-29 ENCOUNTER — Telehealth: Payer: Self-pay | Admitting: Internal Medicine

## 2022-11-29 NOTE — Telephone Encounter (Signed)
Pharmacy sent refill request today in a separate refill encounter for all medications needed.

## 2022-11-29 NOTE — Telephone Encounter (Signed)
Medication Refill - Medication: ALL MEDICATIONS* Almost out  Has the patient contacted their pharmacy? Yes.  Pharmacy sent a request over but advised patient to contact PCP.  Preferred Pharmacy (with phone number or street name):  [facility] - [place], [place] - [address] Phone: [phone]  Fax: [phone]     Has the patient been seen for an appointment in the last year OR does the patient have an upcoming appointment? Yes.  CPE 12/07/2022

## 2022-11-30 NOTE — Telephone Encounter (Signed)
Unable to refill per protocol, Rx expired. Magnesium was discontinued 10/17/22.  Requested Prescriptions  Pending Prescriptions Disp Refills   lisinopril (ZESTRIL) 2.5 MG tablet [Pharmacy Med Name: LISINOPRIL 2.5 MG TAB] 90 tablet 0    Sig: TAKE 1 TABLET BY MOUTH DAILY FOR KIDNEY PROTECTION     Cardiovascular:  ACE Inhibitors Failed - 11/29/2022  1:23 PM      Failed - Cr in normal range and within 180 days    Creat  Date Value Ref Range Status  05/08/2022 0.74 0.50 - 1.05 mg/dL Final   Creatinine, Urine  Date Value Ref Range Status  05/08/2022 53 20 - 275 mg/dL Final         Failed - K in normal range and within 180 days    Potassium  Date Value Ref Range Status  05/08/2022 4.5 3.5 - 5.3 mmol/L Final         Passed - Patient is not pregnant      Passed - Last BP in normal range    BP Readings from Last 1 Encounters:  09/05/22 96/69         Passed - Valid encounter within last 6 months    Recent Outpatient Visits           3 months ago Chest pain, unspecified type   [place] [facility] [place], Salvadore Oxford, NP   4 months ago Vaginal bleeding   [place] [facility] Mecum, Erin E, PA-C   6 months ago Diverticulitis of large intestine without bleeding, unspecified complication status   [place] [facility] Mecum, [place] E, PA-C   6 months ago Benign essential hypertension   [place] [place], Salvadore Oxford, NP       Future Appointments             In 1 week Lorre Munroe, NP [place] [facility]             simvastatin (ZOCOR) 20 MG tablet [Pharmacy Med Name: SIMVASTATIN 20 MG TAB] 90 tablet 0    Sig: TAKE 1 TABLET BY MOUTH EVERY EVENING     Cardiovascular:  Antilipid - Statins Failed - 11/29/2022  1:23 PM      Failed - Lipid Panel in normal range within the last 12 months    Cholesterol  Date Value Ref Range Status  05/08/2022 133 <200 mg/dL Final   LDL  Cholesterol (Calc)  Date Value Ref Range Status  05/08/2022 63 mg/dL (calc) Final    Comment:    Reference range: <100 . Desirable range <100 mg/dL for primary prevention;   <70 mg/dL for patients with CHD or diabetic patients  with > or = 2 CHD risk factors. Marland Kitchen LDL-C is now calculated using the Martin-Hopkins  calculation, which is a validated novel method providing  better accuracy than the Friedewald equation in the  estimation of LDL-C.  Horald Pollen et al. Lenox Ahr. 4098;119(14): 2061-2068  (http://education.Quest[employer].com/faq/FAQ164)    HDL  Date Value Ref Range Status  05/08/2022 54 > OR = 50 mg/dL Final   Triglycerides  Date Value Ref Range Status  05/08/2022 78 <150 mg/dL Final         Passed - Patient is not pregnant      Passed - Valid encounter within last 12 months    Recent Outpatient Visits           3 months ago Chest  pain, unspecified type   [place] [place], [place], NP   4 months ago Vaginal bleeding   [place] [facility] Mecum, Oswaldo Conroy, PA-C   6 months ago Diverticulitis of large intestine without bleeding, unspecified complication status   [facility] Mecum, [place], [place]   6 months ago Benign essential hypertension   [place] [facility] [place], Salvadore Oxford, NP       Future Appointments             In 1 week [place] Si, Salvadore Oxford, NP [place] [facility]             famotidine (PEPCID) 40 MG tablet [Pharmacy Med Name: FAMOTIDINE 40 MG TAB] 90 tablet 0    Sig: TAKE 1 TABLET BY MOUTH DAILY     Gastroenterology:  H2 Antagonists Passed - 11/29/2022  1:23 PM      Passed - Valid encounter within last 12 months    Recent Outpatient Visits           3 months ago Chest pain, unspecified type   [facility] [place], Salvadore Oxford, NP   4 months ago Vaginal bleeding   [place] [facility] Mecum, Oswaldo Conroy, PA-C   6 months ago Diverticulitis of large intestine without bleeding, unspecified complication status   [place] [facility] Mecum, Erin E, PA-C   6 months ago Benign essential hypertension   [place] [facility] [place], Salvadore Oxford, NP       Future Appointments             In 1 week Sampson Si, Salvadore Oxford, NP [place] [facility]             hydrochlorothiazide (HYDRODIURIL) 25 MG tablet [Pharmacy Med Name: HYDROCHLOROTHIAZIDE 25 MG TAB] 90 tablet 0    Sig: TAKE 1 TABLET BY MOUTH DAILY     Cardiovascular: Diuretics - Thiazide Failed - 11/29/2022  1:23 PM      Failed - Cr in normal range and within 180 days    Creat  Date Value Ref Range Status  05/08/2022 0.74 0.50 - 1.05 mg/dL Final   Creatinine, Urine  Date Value Ref Range Status  05/08/2022 53 20 - 275 mg/dL Final         Failed - K in normal range and within 180 days    Potassium  Date Value Ref Range Status  05/08/2022 4.5 3.5 - 5.3 mmol/L Final         Failed - Na in normal range and within 180 days    Sodium  Date Value Ref Range Status  05/08/2022 141 135 - 146 mmol/L Final         Passed - Last BP in normal range    BP Readings from Last 1 Encounters:  09/05/22 96/69         Passed - Valid encounter within last 6 months    Recent Outpatient Visits           3 months ago Chest pain, unspecified type   [place] [facility] [place], Salvadore Oxford, NP   4 months ago Vaginal bleeding   [place] [facility] Mecum, Erin E, PA-C   6 months ago Diverticulitis of large intestine without bleeding, unspecified complication status   [facility] Mecum, Oswaldo Conroy, PA-C   6 months ago Benign essential hypertension   [place] [facility] [place], Salvadore Oxford, NP       Future Appointments             In 1 week [place], Salvadore Oxford, NP [place] [facility]              metFORMIN (GLUCOPHAGE) 500 MG tablet [Pharmacy Med Name: METFORMIN HCL 500 MG TAB] 270 tablet 0    Sig: TAKE 2 TABLETS BY MOUTH DAILY WITH BREAKFAST AND 1 TABLET IN THE EVENING     Endocrinology:  Diabetes - Biguanides Failed - 11/29/2022  1:23 PM      Failed - HBA1C is between 0 and 7.9 and within 180 days    Hgb A1c MFr Bld  Date Value Ref Range Status  05/08/2022 7.0 (H) <5.7 % of total Hgb Final    Comment:    For someone without known diabetes, a hemoglobin A1c value of 6.5% or greater indicates that they may have  diabetes and this should be confirmed with a follow-up  test. . For someone with known diabetes, a value <7% indicates  that their diabetes is well controlled and a value  greater than or equal to 7% indicates suboptimal  control. A1c targets should be individualized based on  duration of diabetes, age, comorbid conditions, and  other considerations. . Currently, no consensus exists regarding use of hemoglobin A1c for diagnosis of diabetes for children. .          Failed - B12 Level in normal range and within 720 days    No results found for: "VITAMINB12"       Failed - CBC within normal limits and completed in the last 12 months    WBC  Date Value Ref Range Status  05/08/2022 7.9 3.8 - 10.8 Thousand/uL Final   RBC  Date Value Ref Range Status  05/08/2022 5.07 3.80 - 5.10 Million/uL Final   Hemoglobin  Date Value Ref Range Status  05/08/2022 11.4 (L) 11.7 - 15.5 g/dL Final   HCT  Date Value Ref Range Status  05/08/2022 36.1 35.0 - 45.0 % Final   MCHC  Date Value Ref Range Status  05/08/2022 31.6 (L) 32.0 - 36.0 g/dL Final   [facility]  Date Value Ref Range Status  05/08/2022 22.5 (L) 27.0 - 33.0 pg Final   MCV  Date Value Ref Range Status  05/08/2022 71.2 (L) 80.0 - 100.0 fL Final   No results found for: "PLTCOUNTKUC", "LABPLAT", "POCPLA" RDW  Date Value Ref Range Status  05/08/2022 16.4 (H) 11.0 - 15.0 % Final         Passed - Cr in  normal range and within 360 days    Creat  Date Value Ref Range Status  05/08/2022 0.74 0.50 - 1.05 mg/dL Final   Creatinine, Urine  Date Value Ref Range Status  05/08/2022 53 20 - 275 mg/dL Final         Passed - eGFR in normal range and within 360 days    GFR calc Af Amer  Date Value Ref Range Status  09/18/2018 >60 >60 mL/min Final   GFR calc non Af Amer  Date Value Ref Range Status  09/18/2018 >60 >60 mL/min Final   eGFR  Date Value Ref Range Status  05/08/2022 88 > OR = 60 mL/min/1.69m2 Final         Passed -  Valid encounter within last 6 months    Recent Outpatient Visits           3 months ago Chest pain, unspecified type   [place] [facility] [place], Salvadore Oxford, NP   4 months ago Vaginal bleeding   [place] [facility] Mecum, Oswaldo Conroy, PA-C   6 months ago Diverticulitis of large intestine without bleeding, unspecified complication status   [place] [facility] Mecum, [place], [place]   6 months ago Benign essential hypertension   [place] [facility] [place], Salvadore Oxford, NP       Future Appointments             In 1 week Sampson Si, Salvadore Oxford, NP [place] [facility]             magnesium oxide (MAG-OX) 400 (240 Mg) MG tablet [Pharmacy Med Name: MAGNESIUM OXIDE -MG SUPPLEMENT 400] 90 tablet     Sig: TAKE 1 TABLET BY MOUTH DAILY WITH BREAKFAST     Endocrinology:  Minerals - Magnesium Supplementation Failed - 11/29/2022  1:23 PM      Failed - Mg Level in normal range and within 360 days    Magnesium  Date Value Ref Range Status  06/29/2021 1.7 1.6 - 2.3 mg/dL Final         Passed - Cr in normal range and within 360 days    Creat  Date Value Ref Range Status  05/08/2022 0.74 0.50 - 1.05 mg/dL Final   Creatinine, Urine  Date Value Ref Range Status  05/08/2022 53 20 - 275 mg/dL Final         Passed - Valid encounter within last 12 months    Recent Outpatient Visits            3 months ago Chest pain, unspecified type   [place] [facility] [place], Salvadore Oxford, NP   4 months ago Vaginal bleeding   [place] Mecum, Erin E, PA-C   6 months ago Diverticulitis of large intestine without bleeding, unspecified complication status   [place] [facility] Mecum, [place] E, [place]   6 months ago Benign essential hypertension   [place] [facility] [place], Salvadore Oxford, NP       Future Appointments             In 1 week Sampson Si, Salvadore Oxford, NP [place] [facility]

## 2022-12-01 ENCOUNTER — Telehealth: Payer: Self-pay

## 2022-12-01 NOTE — Telephone Encounter (Signed)
I don't even see it on her medication list. What was she taking it for? Was it an OTC supplement?

## 2022-12-01 NOTE — Telephone Encounter (Signed)
Copied from CRM [id number]. Topic: General - Other >> Dec 01, 2022  9:23 AM Franchot Heidelberg wrote: Reason for CRM: Pt wants to know if she supposed to continue taking Magnesium because it was not called in for her. Please advise

## 2022-12-01 NOTE — Telephone Encounter (Signed)
I spoke with Ms. Spaar.  She has been on it for several years.  Her old heart Dr told her to stay on it.  (She no longer sees cardiology her previous PCP took over the RX)    Thanks,   -Vernona Rieger

## 2022-12-04 ENCOUNTER — Ambulatory Visit
Admission: RE | Admit: 2022-12-04 | Discharge: 2022-12-04 | Disposition: A | Discharge status: Home / Self Care | Payer: 59 | Source: Ambulatory Visit | Attending: Internal Medicine | Admitting: Internal Medicine

## 2022-12-04 ENCOUNTER — Other Ambulatory Visit: Payer: Self-pay | Admitting: Internal Medicine

## 2022-12-04 DIAGNOSIS — Z1231 Encounter for screening mammogram for malignant neoplasm of breast: Secondary | ICD-10-CM | POA: Insufficient documentation

## 2022-12-04 MED ORDER — MAGNESIUM OXIDE 400 MG PO CAPS: 1.0000 | ORAL_CAPSULE | Freq: Every day | ORAL | 0 refills | Status: DC

## 2022-12-04 NOTE — Telephone Encounter (Signed)
refilled 

## 2022-12-06 ENCOUNTER — Other Ambulatory Visit: Payer: Self-pay | Admitting: Internal Medicine

## 2022-12-06 DIAGNOSIS — R928 Other abnormal and inconclusive findings on diagnostic imaging of breast: Secondary | ICD-10-CM

## 2022-12-06 DIAGNOSIS — R2231 Localized swelling, mass and lump, right upper limb: Secondary | ICD-10-CM

## 2022-12-07 ENCOUNTER — Encounter: Payer: Self-pay | Admitting: Internal Medicine

## 2022-12-07 ENCOUNTER — Ambulatory Visit (INDEPENDENT_AMBULATORY_CARE_PROVIDER_SITE_OTHER): Payer: 59 | Admitting: Internal Medicine

## 2022-12-07 VITALS — BP 128/84 | HR 71 | Temp 95.1°F | Wt 201.0 lb

## 2022-12-07 DIAGNOSIS — E1169 Type 2 diabetes mellitus with other specified complication: Secondary | ICD-10-CM | POA: Diagnosis not present

## 2022-12-07 DIAGNOSIS — Z23 Encounter for immunization: Secondary | ICD-10-CM

## 2022-12-07 DIAGNOSIS — Z0001 Encounter for general adult medical examination with abnormal findings: Secondary | ICD-10-CM | POA: Diagnosis not present

## 2022-12-07 DIAGNOSIS — E6609 Other obesity due to excess calories: Secondary | ICD-10-CM

## 2022-12-07 DIAGNOSIS — E782 Mixed hyperlipidemia: Secondary | ICD-10-CM

## 2022-12-07 DIAGNOSIS — Z6832 Body mass index (BMI) 32.0-32.9, adult: Secondary | ICD-10-CM

## 2022-12-07 LAB — POCT GLYCOSYLATED HEMOGLOBIN (HGB A1C): HbA1c, POC (controlled diabetic range): 7.4 % — AB (ref 0.0–7.0)

## 2022-12-07 MED ORDER — LIDOCAINE 5 % EX PTCH: 1.0000 | MEDICATED_PATCH | CUTANEOUS | 1 refills | Status: DC

## 2022-12-07 NOTE — Addendum Note (Signed)
Addended by: Kavin Leech E on: 12/07/2022 11:06 AM   Modules accepted: Orders

## 2022-12-07 NOTE — Progress Notes (Signed)
Subjective:    Patient ID: Stacy Cunningham, female    DOB: 21-Apr-1951, 72 y.o.   MRN: [id number]  HPI  Patient presents to clinic today for her annual exam.  Flu: 12/2021 Tetanus: 11/2018 COVID: X 6 Pneumovax: 06/2014 Prevnar: 01/2016 Shingrix: Never Pap smear: < 5 years ago Mammogram: 11/2022 Bone density: 11/2020 Colon screening: 05/2022 Vision screening: annually Dentist: biannually  Diet: She does eat meat. She does consumes fruits and veggies. She does eat some fried foods. She drinks mostly water, tea. Exercise: Walking   Review of Systems     Past Medical History:  Diagnosis Date   Anemia    Borderline diabetes mellitus    Diabetes mellitus without complication (HCC)    Elevated lipids    GERD (gastroesophageal reflux disease)    Glaucoma    Headache    h/o migraines   Hypertension    Neuropathy    Palpitations    a. 05/2020 Zio: Predominantly sinus rhythm, 80 (60-148).  1 SVT run-4 beats at 148.  Rare PACs and PVCs; b. 04/2021 Zio: Predominantly sinus rhythm, 80 (59-140).  Rare PACs/PVCs.   Panic attack     Current Outpatient Medications  Medication Sig Dispense Refill   aspirin 81 MG EC tablet Take 81 mg by mouth daily.     Blood Glucose Monitoring Suppl (ACCU-CHEK GUIDE ME) w/Device KIT      brimonidine (ALPHAGAN) 0.15 % ophthalmic solution Place 1 drop into both eyes 3 (three) times daily.     clotrimazole-betamethasone (LOTRISONE) cream Apply topically 2 (two) times daily. 30 g 0   COMBIGAN 0.2-0.5 % ophthalmic solution Apply 1 drop to eye 2 (two) times daily.     dorzolamide-timolol (COSOPT) 22.3-6.8 MG/ML ophthalmic solution Place 1 drop into both eyes at bedtime.     famotidine (PEPCID) 40 MG tablet TAKE 1 TABLET BY MOUTH DAILY 90 tablet 0   ferrous sulfate 325 (65 FE) MG tablet Take 1 tablet (325 mg total) by mouth 2 (two) times daily with a meal. 180 tablet 1   fexofenadine (ALLEGRA ALLERGY) 180 MG tablet Take 1 tablet (180 mg total) by mouth daily. 90  tablet 0   gabapentin (NEURONTIN) 800 MG tablet Take one tablet AM, one PM, two QHS 360 tablet 3   hydrochlorothiazide (HYDRODIURIL) 25 MG tablet TAKE 1 TABLET BY MOUTH DAILY 90 tablet 0   lisinopril (ZESTRIL) 2.5 MG tablet TAKE 1 TABLET BY MOUTH DAILY FOR KIDNEY PROTECTION 90 tablet 0   LUMIGAN 0.01 % SOLN SMARTSIG:In Eye(s)     Magnesium Oxide 400 MG CAPS Take 1 capsule (400 mg total) by mouth daily at 12 noon. 90 capsule 0   metFORMIN (GLUCOPHAGE) 500 MG tablet TAKE 2 TABLETS BY MOUTH DAILY WITH BREAKFAST AND 1 TABLET IN THE EVENING 270 tablet 0   nortriptyline (PAMELOR) 10 MG capsule Take 10 mg by mouth 2 (two) times daily. (Patient not taking: Reported on 09/29/2022)     omeprazole (PRILOSEC) 20 MG capsule TAKE 1 CAPSULE BY MOUTH DAILY 90 capsule 0   propranolol (INDERAL) 20 MG tablet Take 1 tablet (20 mg total) by mouth 2 (two) times daily. 180 tablet 0   RHOPRESSA 0.02 % SOLN Apply to eye.     ROCKLATAN 0.02-0.005 % SOLN Apply to eye.     simvastatin (ZOCOR) 20 MG tablet TAKE 1 TABLET BY MOUTH EVERY EVENING 90 tablet 0   venlafaxine (EFFEXOR) 37.5 MG tablet Take 37.5 mg by mouth 2 (two) times daily.  VIGAMOX 0.5 % ophthalmic solution Place 1 drop into the right eye 4 (four) times daily.     No current facility-administered medications for this visit.    Allergies  Allergen Reactions   Sulfa Antibiotics Dermatitis   Darvon [Propoxyphene] Hives   Other Other (See Comments) and Rash    PAPER TAPE OK TO USE PAPER TAPE OK TO USE    Tape Rash    PAPER TAPE OK TO USE    Family History  Problem Relation Age of Onset   Breast cancer Sister 37   Heart attack Mother 37   Hyperlipidemia Mother    Hypertension Mother     Social History   Socioeconomic History   Marital status: Married    Spouse name: Not on file   Number of children: Not on file   Years of education: Not on file   Highest education level: Not on file  Occupational History   Not on file  Tobacco Use    Smoking status: Never   Smokeless tobacco: Never  Vaping Use   Vaping status: Never Used  Substance and Sexual Activity   Alcohol use: Not Currently    Comment: rare   Drug use: No   Sexual activity: Yes  Other Topics Concern   Not on file  Social History Narrative   Not on file   Social Determinants of Health   Financial Resource Strain: Low Risk  (09/29/2022)   Overall Financial Resource Strain (CARDIA)    Difficulty of Paying Living Expenses: Not hard at all  Food Insecurity: No Food Insecurity (09/29/2022)   Hunger Vital Sign    Worried About Running Out of Food in the Last Year: Never true    Ran Out of Food in the Last Year: Never true  Transportation Needs: No Transportation Needs (09/29/2022)   PRAPARE - Administrator, Civil Service (Medical): No    Lack of Transportation (Non-Medical): No  Physical Activity: Insufficiently Active (09/29/2022)   Exercise Vital Sign    Days of Exercise per Week: 3 days    Minutes of Exercise per Session: 10 min  Stress: No Stress Concern Present (09/29/2022)   [employer] of Occupational Health - Occupational Stress Questionnaire    Feeling of Stress : Not at all  Social Connections: Moderately Integrated (09/29/2022)   Social Connection and Isolation Panel [NHANES]    Frequency of Communication with Friends and Family: More than three times a week    Frequency of Social Gatherings with Friends and Family: More than three times a week    Attends Religious Services: More than 4 times per year    Active Member of [employer] or Organizations: No    Attends Banker Meetings: Never    Marital Status: Married  Catering manager Violence: Not At Risk (09/29/2022)   Humiliation, Afraid, Rape, and Kick questionnaire    Fear of Current or Ex-Partner: No    Emotionally Abused: No    Physically Abused: No    Sexually Abused: No     Constitutional: Patient reports intermittent headaches.  Denies fever, malaise, fatigue, or  abrupt weight changes.  HEENT: Denies eye pain, eye redness, ear pain, ringing in the ears, wax buildup, runny nose, nasal congestion, bloody nose, or sore throat. Respiratory: Denies difficulty breathing, shortness of breath, cough or sputum production.   Cardiovascular: Denies chest pain, chest tightness, palpitations or swelling in the hands or feet.  Gastrointestinal: Denies abdominal pain, bloating, constipation, diarrhea or blood  in the stool.  GU: Denies urgency, frequency, pain with urination, burning sensation, blood in urine, odor or discharge. Musculoskeletal: Patient reports low back pain.  Denies decrease in range of motion, difficulty with gait, muscle pain or joint swelling.  Skin: Denies redness, rashes, lesions or ulcercations.  Neurological: Pt reports paresthesia of lower extremities. Denies dizziness, difficulty with memory, difficulty with speech or problems with balance and coordination.  Psych: Denies anxiety, depression, SI/HI.  No other specific complaints in a complete review of systems (except as listed in HPI above).  Objective:   Physical Exam BP 128/84 (BP Location: Left Arm, Patient Position: Sitting, Cuff Size: Large)   Pulse 71   Temp (!) 95.1 F (35.1 C) (Temporal)   Wt 201 lb (91.2 kg)   SpO2 98%   BMI 32.44 kg/m   Wt Readings from Last 3 Encounters:  12/07/22 201 lb (91.2 kg)  09/29/22 201 lb (91.2 kg)  08/31/22 201 lb (91.2 kg)    General: Appears her stated age, obese, in NAD. Skin: Warm, dry and intact. No ulcerations noted. HEENT: Head: normal shape and size; Eyes: sclera white, no icterus, conjunctiva pink, PERRLA and EOMs intact;  Neck:  Neck supple, trachea midline.  Multinodular goiter noted. Cardiovascular: Normal rate and rhythm. S1,S2 noted.  No murmur, rubs or gallops noted. No JVD or BLE edema. No carotid bruits noted. Pulmonary/Chest: Normal effort and positive vesicular breath sounds. No respiratory distress. No wheezes, rales or  ronchi noted.  Abdomen: Normal bowel sounds.  Musculoskeletal: Pain with palpation over the lumbar spine.  Strength 5 fourth 5 BUE/BLE.  No difficulty with gait.  Neurological: Alert and oriented. Cranial nerves II-XII grossly intact. Coordination normal.  Psychiatric: Mood and affect normal. Behavior is normal. Judgment and thought content normal.     BMET    Component Value Date/Time   NA 141 05/08/2022 1040   K 4.5 05/08/2022 1040   CL 103 05/08/2022 1040   CO2 31 05/08/2022 1040   GLUCOSE 120 (H) 05/08/2022 1040   BUN 14 05/08/2022 1040   CREATININE 0.74 05/08/2022 1040   CALCIUM 9.5 05/08/2022 1040   GFRNONAA >60 09/18/2018 1030   GFRAA >60 09/18/2018 1030    Lipid Panel     Component Value Date/Time   CHOL 133 05/08/2022 1040   TRIG 78 05/08/2022 1040   HDL 54 05/08/2022 1040   CHOLHDL 2.5 05/08/2022 1040   LDLCALC 63 05/08/2022 1040    CBC    Component Value Date/Time   WBC 7.9 05/08/2022 1040   RBC 5.07 05/08/2022 1040   HGB 11.4 (L) 05/08/2022 1040   HCT 36.1 05/08/2022 1040   PLT 271 05/08/2022 1040   MCV 71.2 (L) 05/08/2022 1040   MCH 22.5 (L) 05/08/2022 1040   MCHC 31.6 (L) 05/08/2022 1040   RDW 16.4 (H) 05/08/2022 1040   LYMPHSABS 2.4 06/05/2018 0608   MONOABS 0.5 06/05/2018 0608   EOSABS 0.1 06/05/2018 0608   BASOSABS 0.1 06/05/2018 0608    Hgb A1C Lab Results  Component Value Date   HGBA1C 7.0 (H) 05/08/2022            Assessment & Plan:   Preventative health maintenance:  Encouraged her to get a flu shot in the fall Tetanus UTD Encouraged her to get her COVID booster Pneumovax and Prevnar 13 UTD Prevnar 20 today Discussed Shingrix vaccine, she will check coverage with her insurance company and schedule visit if she would like to have this done She no  longer needs to screen for cervical cancer Mammogram UTD Bone density UTD Colon screening UTD Encouraged her to consume a balanced diet and exercise regimen Advised her to see  an eye doctor and dentist annually We will check CBC, c-Met, lipid, A1c today  RTC in 6 months, follow-up chronic conditions [name], NP

## 2022-12-07 NOTE — Patient Instructions (Signed)
Health Maintenance for Postmenopausal Women Menopause is a normal process in which your ability to get pregnant comes to an end. This process happens slowly over many months or years, usually between the ages of 48 and 55. Menopause is complete when you have missed your menstrual period for 12 months. It is important to talk with your health care provider about some of the most common conditions that affect women after menopause (postmenopausal women). These include heart disease, cancer, and bone loss (osteoporosis). Adopting a healthy lifestyle and getting preventive care can help to promote your health and wellness. The actions you take can also lower your chances of developing some of these common conditions. What are the signs and symptoms of menopause? During menopause, you may have the following symptoms: Hot flashes. These can be moderate or severe. Night sweats. Decrease in sex drive. Mood swings. Headaches. Tiredness (fatigue). Irritability. Memory problems. Problems falling asleep or staying asleep. Talk with your health care provider about treatment options for your symptoms. Do I need hormone replacement therapy? Hormone replacement therapy is effective in treating symptoms that are caused by menopause, such as hot flashes and night sweats. Hormone replacement carries certain risks, especially as you become older. If you are thinking about using estrogen or estrogen with progestin, discuss the benefits and risks with your health care provider. How can I reduce my risk for heart disease and stroke? The risk of heart disease, heart attack, and stroke increases as you age. One of the causes may be a change in the body's hormones during menopause. This can affect how your body uses dietary fats, triglycerides, and cholesterol. Heart attack and stroke are medical emergencies. There are many things that you can do to help prevent heart disease and stroke. Watch your blood pressure High  blood pressure causes heart disease and increases the risk of stroke. This is more likely to develop in people who have high blood pressure readings or are overweight. Have your blood pressure checked: Every 3-5 years if you are 18-39 years of age. Every year if you are 40 years old or older. Eat a healthy diet  Eat a diet that includes plenty of vegetables, fruits, low-fat dairy products, and lean protein. Do not eat a lot of foods that are high in solid fats, added sugars, or sodium. Get regular exercise Get regular exercise. This is one of the most important things you can do for your health. Most adults should: Try to exercise for at least 150 minutes each week. The exercise should increase your heart rate and make you sweat (moderate-intensity exercise). Try to do strengthening exercises at least twice each week. Do these in addition to the moderate-intensity exercise. Spend less time sitting. Even light physical activity can be beneficial. Other tips Work with your health care provider to achieve or maintain a healthy weight. Do not use any products that contain nicotine or tobacco. These products include cigarettes, chewing tobacco, and vaping devices, such as e-cigarettes. If you need help quitting, ask your health care provider. Know your numbers. Ask your health care provider to check your cholesterol and your blood sugar (glucose). Continue to have your blood tested as directed by your health care provider. Do I need screening for cancer? Depending on your health history and family history, you may need to have cancer screenings at different stages of your life. This may include screening for: Breast cancer. Cervical cancer. Lung cancer. Colorectal cancer. What is my risk for osteoporosis? After menopause, you may be   at increased risk for osteoporosis. Osteoporosis is a condition in which bone destruction happens more quickly than new bone creation. To help prevent osteoporosis or  the bone fractures that can happen because of osteoporosis, you may take the following actions: If you are 19-50 years old, get at least 1,000 mg of calcium and at least 600 international units (IU) of vitamin D per day. If you are older than age 50 but younger than age 70, get at least 1,200 mg of calcium and at least 600 international units (IU) of vitamin D per day. If you are older than age 70, get at least 1,200 mg of calcium and at least 800 international units (IU) of vitamin D per day. Smoking and drinking excessive alcohol increase the risk of osteoporosis. Eat foods that are rich in calcium and vitamin D, and do weight-bearing exercises several times each week as directed by your health care provider. How does menopause affect my mental health? Depression may occur at any age, but it is more common as you become older. Common symptoms of depression include: Feeling depressed. Changes in sleep patterns. Changes in appetite or eating patterns. Feeling an overall lack of motivation or enjoyment of activities that you previously enjoyed. Frequent crying spells. Talk with your health care provider if you think that you are experiencing any of these symptoms. General instructions See your health care provider for regular wellness exams and vaccines. This may include: Scheduling regular health, dental, and eye exams. Getting and maintaining your vaccines. These include: Influenza vaccine. Get this vaccine each year before the flu season begins. Pneumonia vaccine. Shingles vaccine. Tetanus, diphtheria, and pertussis (Tdap) booster vaccine. Your health care provider may also recommend other immunizations. Tell your health care provider if you have ever been abused or do not feel safe at home. Summary Menopause is a normal process in which your ability to get pregnant comes to an end. This condition causes hot flashes, night sweats, decreased interest in sex, mood swings, headaches, or lack  of sleep. Treatment for this condition may include hormone replacement therapy. Take actions to keep yourself healthy, including exercising regularly, eating a healthy diet, watching your weight, and checking your blood pressure and blood sugar levels. Get screened for cancer and depression. Make sure that you are up to date with all your vaccines. This information is not intended to replace advice given to you by your health care provider. Make sure you discuss any questions you have with your health care provider. Document Revised: 08/30/2020 Document Reviewed: 08/30/2020 Elsevier Patient Education  2024 Elsevier Inc.  

## 2022-12-07 NOTE — Assessment & Plan Note (Signed)
Encourage diet and exercise for weight loss 

## 2022-12-08 ENCOUNTER — Ambulatory Visit
Admission: RE | Admit: 2022-12-08 | Discharge: 2022-12-08 | Disposition: A | Discharge status: Home / Self Care | Payer: 59 | Source: Ambulatory Visit | Attending: Internal Medicine | Admitting: Internal Medicine

## 2022-12-08 ENCOUNTER — Telehealth: Payer: Self-pay | Admitting: Internal Medicine

## 2022-12-08 DIAGNOSIS — R2231 Localized swelling, mass and lump, right upper limb: Secondary | ICD-10-CM | POA: Diagnosis present

## 2022-12-08 DIAGNOSIS — R928 Other abnormal and inconclusive findings on diagnostic imaging of breast: Secondary | ICD-10-CM | POA: Diagnosis not present

## 2022-12-08 LAB — COMPLETE METABOLIC PANEL WITH GFR
AG Ratio: 1.7 (calc) (ref 1.0–2.5)
ALT: 17 U/L (ref 6–29)
AST: 15 U/L (ref 10–35)
Albumin: 4.1 g/dL (ref 3.6–5.1)
Alkaline phosphatase (APISO): 79 U/L (ref 37–153)
BUN: 11 mg/dL (ref 7–25)
CO2: 31 mmol/L (ref 20–32)
Calcium: 9.5 mg/dL (ref 8.6–10.4)
Chloride: 101 mmol/L (ref 98–110)
Creat: 0.75 mg/dL (ref 0.50–1.05)
Globulin: 2.4 g/dL (ref 1.9–3.7)
Glucose, Bld: 136 mg/dL — ABNORMAL HIGH (ref 65–99)
Potassium: 4.8 mmol/L (ref 3.5–5.3)
Sodium: 140 mmol/L (ref 135–146)
Total Bilirubin: 0.3 mg/dL (ref 0.2–1.2)
Total Protein: 6.5 g/dL (ref 6.1–8.1)
eGFR: 86 mL/min/{1.73_m2} (ref >=60)

## 2022-12-08 LAB — LIPID PANEL
Cholesterol: 132 mg/dL (ref <200)
HDL: 52 mg/dL (ref >=50)
LDL Cholesterol (Calc): 60 mg/dL
Non-HDL Cholesterol (Calc): 80 mg/dL (ref <130)
Total CHOL/HDL Ratio: 2.5 (calc) (ref <5.0)
Triglycerides: 116 mg/dL (ref <150)

## 2022-12-08 LAB — CBC
HCT: 36 % (ref 35.0–45.0)
Hemoglobin: 11.1 g/dL — ABNORMAL LOW (ref 11.7–15.5)
MCH: 22.1 pg — ABNORMAL LOW (ref 27.0–33.0)
MCHC: 30.8 g/dL — ABNORMAL LOW (ref 32.0–36.0)
MCV: 71.7 fL — ABNORMAL LOW (ref 80.0–100.0)
MPV: 10.8 fL (ref 7.5–12.5)
Platelets: 285 10*3/uL (ref 140–400)
RBC: 5.02 10*6/uL (ref 3.80–5.10)
RDW: 16.1 % — ABNORMAL HIGH (ref 11.0–15.0)
WBC: 8.6 10*3/uL (ref 3.8–10.8)

## 2022-12-08 NOTE — Telephone Encounter (Signed)
Pt is calling in requesting a list of her medications be sent to her VIA mail. Pt says she now has life alert and they are requiring her to have a list of medications in case something happens and the paramedics come.

## 2022-12-18 ENCOUNTER — Ambulatory Visit: Payer: 59 | Admitting: Podiatry

## 2022-12-20 ENCOUNTER — Telehealth: Payer: Self-pay

## 2022-12-20 NOTE — Telephone Encounter (Signed)
Copied from CRM [id number]. Topic: General - Other >> Dec 20, 2022 10:20 AM Stacy Cunningham wrote: Patient called to get the results of her Diagnostic Mammogram from 8/16. Please advise.

## 2022-12-21 NOTE — Telephone Encounter (Signed)
These findings were discussed with her at her appointment by the person that read the mammogram.  She has a sebaceous cyst in her right axilla.  They recommend screening mammogram in 1 year.

## 2022-12-21 NOTE — Telephone Encounter (Signed)
Pt advised.   Thanks,   -Laura  

## 2023-01-15 ENCOUNTER — Ambulatory Visit: Payer: 59 | Admitting: Podiatry

## 2023-01-24 ENCOUNTER — Other Ambulatory Visit: Payer: Self-pay | Admitting: Internal Medicine

## 2023-01-25 ENCOUNTER — Telehealth: Payer: Self-pay | Admitting: Internal Medicine

## 2023-01-25 MED ORDER — METHOCARBAMOL 500 MG PO TABS: 500.0000 mg | ORAL_TABLET | Freq: Three times a day (TID) | ORAL | 0 refills | Status: DC | PRN

## 2023-01-25 NOTE — Telephone Encounter (Signed)
Pt is calling in because she says her Muscle relaxers are too strong and she would like a milder medication. Pt is currently taking tiZANidine (ZANAFLEX) 4 MG tablet [[id number]] and says it's too strong. Please advise.

## 2023-01-25 NOTE — Telephone Encounter (Signed)
All she needs to do is break them in half.  That would make her take tizanidine 2 mg which is the lowest dose they make.

## 2023-01-25 NOTE — Addendum Note (Signed)
Addended by: Lorre Munroe on: 01/25/2023 12:30 PM   Modules accepted: Orders

## 2023-01-25 NOTE — Telephone Encounter (Signed)
Requested Prescriptions  Pending Prescriptions Disp Refills   omeprazole (PRILOSEC) 20 MG capsule [Pharmacy Med Name: OMEPRAZOLE DR 20 MG CAP] 90 capsule 0    Sig: TAKE 1 CAPSULE BY MOUTH DAILY     Gastroenterology: Proton Pump Inhibitors Passed - 01/24/2023  2:26 PM      Passed - Valid encounter within last 12 months    Recent Outpatient Visits           1 month ago Encounter for general adult medical examination with abnormal findings   [place] [facility] [place], Salvadore Oxford, NP   4 months ago Chest pain, unspecified type   [place] [facility] [place], Salvadore Oxford, NP   6 months ago Vaginal bleeding   [place] [facility], Oswaldo Conroy, PA-C   8 months ago Diverticulitis of large intestine without bleeding, unspecified complication status   [place] [facility] [facility] Mec[place], [place] E, [place]   8 months ago Benign essential hypertension    [facility] [place], Salvadore Oxford, NP       Future Appointments             In 4 months [place], Salvadore Oxford, NP  [facility]

## 2023-01-25 NOTE — Telephone Encounter (Signed)
Pt would like to try a different medication.  She is afraid to take it with the side effects she had.   Pt uses [facility]    Thank you,   -Vernona Rieger

## 2023-01-25 NOTE — Telephone Encounter (Signed)
Methocarbamol sent to pharmacy

## 2023-02-13 ENCOUNTER — Ambulatory Visit (INDEPENDENT_AMBULATORY_CARE_PROVIDER_SITE_OTHER): Payer: 59 | Admitting: Podiatry

## 2023-02-13 DIAGNOSIS — Z91199 Patient's noncompliance with other medical treatment and regimen due to unspecified reason: Secondary | ICD-10-CM

## 2023-02-13 NOTE — Progress Notes (Signed)
   Complete physical exam  Patient: Stacy Cunningham   DOB: 02/11/1997   72 y.o. Female  MRN: [id number]  Subjective:    No chief complaint on file.   Stacy Cunningham is a 72 y.o. female who presents today for a complete physical exam. She reports consuming a {diet types:17450} diet. {types:19826} She generally feels {DESC; WELL/FAIRLY WELL/POORLY:18703}. She reports sleeping {DESC; WELL/FAIRLY WELL/POORLY:18703}. She {does/does not:200015} have additional problems to discuss today.    Most recent fall risk assessment:    10/19/2021   10:42 AM  Fall Risk   Falls in the past year? 0  Number falls in past yr: 0  Injury with Fall? 0  Risk for fall due to : No Fall Risks  Follow up Falls evaluation completed     Most recent depression screenings:    10/19/2021   10:42 AM 09/09/2020   10:46 AM  PHQ 2/9 Scores  PHQ - 2 Score 0 0  PHQ- 9 Score 5     {VISON DENTAL STD PSA (Optional):27386}  {History (Optional):23778}  Patient Care Team: Jessup, Joy, NP as PCP - General (Nurse Practitioner)   Outpatient Medications Prior to Visit  Medication Sig   fluticasone (FLONASE) 50 MCG/ACT nasal spray Place 2 sprays into both nostrils in the morning and at bedtime. After 7 days, reduce to once daily.   norgestimate-ethinyl estradiol (SPRINTEC 28) 0.25-35 MG-MCG tablet Take 1 tablet by mouth daily.   Nystatin POWD Apply liberally to affected area 2 times per day   spironolactone (ALDACTONE) 100 MG tablet Take 1 tablet (100 mg total) by mouth daily.   No facility-administered medications prior to visit.    ROS        Objective:     There were no vitals taken for this visit. {Vitals History (Optional):23777}  Physical Exam   No results found for any visits on 11/24/21. {Show previous labs (optional):23779}    Assessment & Plan:    Routine Health Maintenance and Physical Exam  Immunization History  Administered Date(s) Administered   DTaP 04/27/1999, 06/23/1999,  09/01/1999, 05/17/2000, 12/01/2003   Hepatitis A 09/27/2007, 10/02/2008   Hepatitis B 02/12/1999, 03/22/1999, 09/01/1999   HiB (PRP-OMP) 04/27/1999, 06/23/1999, 09/01/1999, 05/17/2000   IPV 04/27/1999, 06/23/1999, 02/20/2000, 12/01/2003   Influenza,inj,Quad PF,6+ Mos 01/02/2014   Influenza-Unspecified 04/03/2012   MMR 02/19/2001, 12/01/2003   Meningococcal Polysaccharide 10/02/2011   Pneumococcal Conjugate-13 05/17/2000   Pneumococcal-Unspecified 09/01/1999, 11/15/1999   Tdap 10/02/2011   Varicella 02/20/2000, 09/27/2007    Health Maintenance  Topic Date Due   HIV Screening  Never done   Hepatitis C Screening  Never done   INFLUENZA VACCINE  11/22/2021   PAP-Cervical Cytology Screening  11/24/2021 (Originally 02/11/2020)   PAP SMEAR-Modifier  11/24/2021 (Originally 02/11/2020)   TETANUS/TDAP  11/24/2021 (Originally 10/01/2021)   HPV VACCINES  Discontinued   COVID-19 Vaccine  Discontinued    Discussed health benefits of physical activity, and encouraged her to engage in regular exercise appropriate for her age and condition.  Problem List Items Addressed This Visit   None Visit Diagnoses     Annual physical exam    -  Primary   Cervical cancer screening       Need for Tdap vaccination          No follow-ups on file.     Joy Jessup, NP   

## 2023-02-14 ENCOUNTER — Ambulatory Visit: Payer: Self-pay

## 2023-02-14 NOTE — Telephone Encounter (Signed)
Chief Complaint: Sinus congestion  Symptoms: nasal congestion, mild dry cough Frequency: comes and goes  Pertinent Negatives: Patient denies chest pain, headache, fever Disposition: [] ED /[] Urgent Care (no appt availability in office) / [] Appointment(In office/virtual)/ []  [facility]/ [x] Home Care/ [] Refused Recommended Disposition /[] [facility]/ []  Follow-up with PCP Additional Notes: Patient states she had eye surgery yesterday and since than she has had sinus pressure around the nose area, nasal congestion and a dry cough. Patient states she can not lean her head forward or backwards right now so she can not do saline nasal washes. Care advice given with over the counter recommendations per patient's request. Patient stated she would try the over the counter medicine first. Advised patient to callback if symptoms get worse. Patient verbalized understanding.   Summary: sinus drainage, cough stuffiness   Pt called in wants to know what to take for her sinuses with her other meds. She has drainage, stuffy nose and cough     Reason for Disposition  [1] Sinus congestion as part of a cold AND [2] present < 10 days  Answer Assessment - Initial Assessment Questions 1. LOCATION: "Where does it hurt?"      Around my nose 2. ONSET: "When did the sinus pain start?"  (e.g., hours, days)      Yesterday  3. SEVERITY: "How bad is the pain?"   (Scale 1-10; mild, moderate or severe)   - MILD (1-3): doesn't interfere with normal activities    - MODERATE (4-7): interferes with normal activities (e.g., work or school) or awakens from sleep   - SEVERE (8-10): excruciating pain and patient unable to do any normal activities        6/10 4. RECURRENT SYMPTOM: "Have you ever had sinus problems before?" If Yes, ask: "When was the last time?" and "What happened that time?"      Yes 5. NASAL CONGESTION: "Is the nose blocked?" If Yes, ask: "Can you open it or must you breathe through your  mouth?"     Yes it is blocked on one said   6. NASAL DISCHARGE: "Do you have discharge from your nose?" If so ask, "What color?"     Yes, clear 7. FEVER: "Do you have a fever?" If Yes, ask: "What is it, how was it measured, and when did it start?"      No  8. OTHER SYMPTOMS: "Do you have any other symptoms?" (e.g., sore throat, cough, earache, difficulty breathing)   Mild dry  Cough,  Protocols used: Sinus Pain or Congestion-A-AH

## 2023-02-14 NOTE — Telephone Encounter (Signed)
Left message for patient, she should follow up with surgeon.

## 2023-02-23 ENCOUNTER — Ambulatory Visit: Payer: Self-pay | Admitting: *Deleted

## 2023-02-23 NOTE — Telephone Encounter (Signed)
Summary: Migraine Headaches, Rx request   Pt is requesting butalbital 40 MG tablets, she says she has been experiencing bad migraine headaches.  Best contact: [phone]      Called pt - left message on machine to return our call. We have made 3 attempts to contact pt without success. I will forward encounter to clinic for follow up.

## 2023-02-23 NOTE — Telephone Encounter (Signed)
Summary: Migraine Headaches, Rx request   Pt is requesting butalbital 40 MG tablets, she says she has been experiencing bad migraine headaches.  Best contact: [phone]     Called pt  - left message on machine to return our call.

## 2023-02-23 NOTE — Telephone Encounter (Signed)
Spoke with patient and appointment made

## 2023-02-23 NOTE — Telephone Encounter (Signed)
Medication is not on her list. She needs to be seen for evaluation of this.

## 2023-02-23 NOTE — Telephone Encounter (Signed)
Summary: Migraine Headaches, Rx request   Pt is requesting butalbital 40 MG tablets, she says she has been experiencing bad migraine headaches.  Best contact: [phone]     Attempted to call patient- no answer- left message to for patient to call office

## 2023-02-27 ENCOUNTER — Other Ambulatory Visit: Payer: 59

## 2023-02-28 ENCOUNTER — Other Ambulatory Visit: Payer: Self-pay | Admitting: Internal Medicine

## 2023-02-28 NOTE — Telephone Encounter (Signed)
Medication Refill - Most Recent Primary Care Visit:  Provider: Lorre Munroe  Department: [facility]  Visit Type: PHYSICAL 20  Date: 12/07/2022  Medication: hydrochlorothiazide (HYDRODIURIL) 25 MG tablet , lisinopril (ZESTRIL) 2.5 MG tablet, famotidine (PEPCID) 40 MG tablet    [facility] - [place], [place] - [address]  [address] [place] [address]  Phone: [phone] Fax: [phone]  Hours: Not open 24 hours    Has the patient contacted their pharmacy? Yes (Agent: If no, request that the patient contact the pharmacy for the refill. If patient does not wish to contact the pharmacy document the reason why and proceed with request.) (Agent: If yes, when and what did the pharmacy advise?)  Has the prescription been filled recently? Yes  Is the patient out of the medication? No  Has the patient been seen for an appointment in the last year OR does the patient have an upcoming appointment? Yes  Can we respond through MyChart? Yes  Agent: Please be advised that Rx refills may take up to 3 business days. We ask that you follow-up with your pharmacy.

## 2023-03-01 MED ORDER — FAMOTIDINE 40 MG PO TABS: 40.0000 mg | ORAL_TABLET | Freq: Every day | ORAL | 2 refills | Status: DC

## 2023-03-01 NOTE — Telephone Encounter (Signed)
Requested Prescriptions  Pending Prescriptions Disp Refills   hydrochlorothiazide (HYDRODIURIL) 25 MG tablet [Pharmacy Med Name: HYDROCHLOROTHIAZIDE 25 MG TAB] 90 tablet 1    Sig: TAKE 1 TABLET BY MOUTH DAILY     Cardiovascular: Diuretics - Thiazide Passed - 02/28/2023 10:46 AM      Passed - Cr in normal range and within 180 days    Creat  Date Value Ref Range Status  12/07/2022 0.75 0.50 - 1.05 mg/dL Final   Creatinine, Urine  Date Value Ref Range Status  05/08/2022 53 20 - 275 mg/dL Final         Passed - K in normal range and within 180 days    Potassium  Date Value Ref Range Status  12/07/2022 4.8 3.5 - 5.3 mmol/L Final         Passed - Na in normal range and within 180 days    Sodium  Date Value Ref Range Status  12/07/2022 140 135 - 146 mmol/L Final         Passed - Last BP in normal range    BP Readings from Last 1 Encounters:  12/07/22 128/84         Passed - Valid encounter within last 6 months    Recent Outpatient Visits           2 months ago Encounter for general adult medical examination with abnormal findings   [place] [facility] [place], Salvadore Oxford, NP   6 months ago Chest pain, unspecified type   [place] [facility] [place], Salvadore Oxford, NP   7 months ago Vaginal bleeding   [place] [facility] Mecum, Oswaldo Conroy, PA-C   9 months ago Diverticulitis of large intestine without bleeding, unspecified complication status   [place] [facility] Mecum, [place], [place]   9 months ago Benign essential hypertension   [place] [facility] [place], Salvadore Oxford, NP       Future Appointments             In 4 days [place], Salvadore Oxford, NP [facility]   In 3 months [place], Salvadore Oxford, NP [place] [facility], [facility]             lisinopril (ZESTRIL) 2.5 MG tablet [Pharmacy Med Name: LISINOPRIL 2.5 MG TAB] 90 tablet 1    Sig: TAKE 1 TABLET BY  MOUTH DAILY FOR KIDNEY PROTECTION     Cardiovascular:  ACE Inhibitors Passed - 02/28/2023 10:46 AM      Passed - Cr in normal range and within 180 days    Creat  Date Value Ref Range Status  12/07/2022 0.75 0.50 - 1.05 mg/dL Final   Creatinine, Urine  Date Value Ref Range Status  05/08/2022 53 20 - 275 mg/dL Final         Passed - K in normal range and within 180 days    Potassium  Date Value Ref Range Status  12/07/2022 4.8 3.5 - 5.3 mmol/L Final         Passed - Patient is not pregnant      Passed - Last BP in normal range    BP Readings from Last 1 Encounters:  12/07/22 128/84         Passed - Valid encounter within last 6 months    Recent Outpatient Visits           2  months ago Encounter for general adult medical examination with abnormal findings   [place] [facility] [place], Salvadore Oxford, NP   6 months ago Chest pain, unspecified type   [place] [facility] [place], Salvadore Oxford, NP   7 months ago Vaginal bleeding   [place] [facility] Mecum, Oswaldo Conroy, PA-C   9 months ago Diverticulitis of large intestine without bleeding, unspecified complication status   [place] [facility] Mecum, Oswaldo Conroy, [place]   9 months ago Benign essential hypertension   [place] [facility] [place], Salvadore Oxford, NP       Future Appointments             In 4 days [place], Salvadore Oxford, NP  [facility], [facility]   In 3 months [place], Salvadore Oxford, NP [place] [facility], [place]

## 2023-03-01 NOTE — Telephone Encounter (Signed)
Requested Prescriptions  Pending Prescriptions Disp Refills   hydrochlorothiazide (HYDRODIURIL) 25 MG tablet 90 tablet 0    Sig: Take 1 tablet (25 mg total) by mouth daily.     Cardiovascular: Diuretics - Thiazide Passed - 02/28/2023 11:44 AM      Passed - Cr in normal range and within 180 days    Creat  Date Value Ref Range Status  12/07/2022 0.75 0.50 - 1.05 mg/dL Final   Creatinine, Urine  Date Value Ref Range Status  05/08/2022 53 20 - 275 mg/dL Final         Passed - K in normal range and within 180 days    Potassium  Date Value Ref Range Status  12/07/2022 4.8 3.5 - 5.3 mmol/L Final         Passed - Na in normal range and within 180 days    Sodium  Date Value Ref Range Status  12/07/2022 140 135 - 146 mmol/L Final         Passed - Last BP in normal range    BP Readings from Last 1 Encounters:  12/07/22 128/84         Passed - Valid encounter within last 6 months    Recent Outpatient Visits           2 months ago Encounter for general adult medical examination with abnormal findings   [facility] [place], Salvadore Oxford, NP   6 months ago Chest pain, unspecified type   [place] [facility] [place], Salvadore Oxford, NP   7 months ago Vaginal bleeding   [facility] Mecum, Erin E, PA-C   9 months ago Diverticulitis of large intestine without bleeding, unspecified complication status   [place] [facility]ecum, [place] E, [place]   9 months ago Benign essential hypertension   [facility] [place], Salvadore Oxford, NP       Future Appointments             In 4 days [employer], Salvadore Oxford, NP [place] [facility]   In 3 months [place], Salvadore Oxford, NP [place] [facility]             lisinopril (ZESTRIL) 2.5 MG tablet 90 tablet 0     Cardiovascular:  ACE Inhibitors Passed - 02/28/2023 11:44 AM      Passed - Cr in normal range and  within 180 days    Creat  Date Value Ref Range Status  12/07/2022 0.75 0.50 - 1.05 mg/dL Final   Creatinine, Urine  Date Value Ref Range Status  05/08/2022 53 20 - 275 mg/dL Final         Passed - K in normal range and within 180 days    Potassium  Date Value Ref Range Status  12/07/2022 4.8 3.5 - 5.3 mmol/L Final         Passed - Patient is not pregnant      Passed - Last BP in normal range    BP Readings from Last 1 Encounters:  12/07/22 128/84         Passed - Valid encounter within last 6 months    Recent Outpatient Visits           2 months ago Encounter for general adult medical examination with abnormal findings   [facility] [place], Salvadore Oxford, NP  6 months ago Chest pain, unspecified type   [place] [place], Salvadore Oxford, NP   7 months ago Vaginal bleeding   [place] [facility] Mecum, Oswaldo Conroy, PA-C   9 months ago Diverticulitis of large intestine without bleeding, unspecified complication status   [facility] Mecum, [place] E, [place]   9 months ago Benign essential hypertension   [facility] [place], Salvadore Oxford, NP       Future Appointments             In 4 days [place], Salvadore Oxford, NP [facility]   In 3 months [place], Salvadore Oxford, NP [place] [facility], [facility]             famotidine (PEPCID) 40 MG tablet 90 tablet 2    Sig: Take 1 tablet (40 mg total) by mouth daily.     Gastroenterology:  H2 Antagonists Passed - 02/28/2023 11:44 AM      Passed - Valid encounter within last 12 months    Recent Outpatient Visits           2 months ago Encounter for general adult medical examination with abnormal findings   [facility] [place], Salvadore Oxford, NP   6 months ago Chest pain, unspecified type   [facility] [place], Salvadore Oxford, NP   7 months ago  Vaginal bleeding   [place] [facility] Mecum, Oswaldo Conroy, PA-C   9 months ago Diverticulitis of large intestine without bleeding, unspecified complication status   [place] Mecum, [place] E, [place]   9 months ago Benign essential hypertension    [facility] [place], Salvadore Oxford, NP       Future Appointments             In 4 days [place], Salvadore Oxford, NP  [facility]   In 3 months [place], Salvadore Oxford, NP [facility], [place]

## 2023-03-05 ENCOUNTER — Ambulatory Visit: Payer: 59 | Admitting: Internal Medicine

## 2023-03-05 ENCOUNTER — Other Ambulatory Visit: Payer: Self-pay | Admitting: Internal Medicine

## 2023-03-06 NOTE — Telephone Encounter (Signed)
Requested medication (s) are due for refill today: yes  Requested medication (s) are on the active medication list:yes  Last refill:  11/30/22 #270  Future visit scheduled: yes  Notes to clinic:  abnormal labs   Requested Prescriptions  Pending Prescriptions Disp Refills   metFORMIN (GLUCOPHAGE) 500 MG tablet [Pharmacy Med Name: METFORMIN HCL 500 MG TAB] 270 tablet     Sig: TAKE 2 TABLETS BY MOUTH DAILY WITH BREAKFAST AND 1 TABLET IN THE EVENING     Endocrinology:  Diabetes - Biguanides Failed - 03/05/2023  1:29 PM      Failed - B12 Level in normal range and within 720 days    No results found for: "VITAMINB12"       Failed - CBC within normal limits and completed in the last 12 months    WBC  Date Value Ref Range Status  12/07/2022 8.6 3.8 - 10.8 Thousand/uL Final   RBC  Date Value Ref Range Status  12/07/2022 5.02 3.80 - 5.10 Million/uL Final   Hemoglobin  Date Value Ref Range Status  12/07/2022 11.1 (L) 11.7 - 15.5 g/dL Final   HCT  Date Value Ref Range Status  12/07/2022 36.0 35.0 - 45.0 % Final   MCHC  Date Value Ref Range Status  12/07/2022 30.8 (L) 32.0 - 36.0 g/dL Final   [facility]  Date Value Ref Range Status  12/07/2022 22.1 (L) 27.0 - 33.0 pg Final   MCV  Date Value Ref Range Status  12/07/2022 71.7 (L) 80.0 - 100.0 fL Final   No results found for: "PLTCOUNTKUC", "LABPLAT", "POCPLA" RDW  Date Value Ref Range Status  12/07/2022 16.1 (H) 11.0 - 15.0 % Final         Passed - Cr in normal range and within 360 days    Creat  Date Value Ref Range Status  12/07/2022 0.75 0.50 - 1.05 mg/dL Final   Creatinine, Urine  Date Value Ref Range Status  05/08/2022 53 20 - 275 mg/dL Final         Passed - HBA1C is between 0 and 7.9 and within 180 days    HbA1c, POC (controlled diabetic range)  Date Value Ref Range Status  12/07/2022 7.4 (A) 0.0 - 7.0 % Final         Passed - eGFR in normal range and within 360 days    GFR calc Af Amer  Date Value Ref Range  Status  09/18/2018 >60 >60 mL/min Final   GFR calc non Af Amer  Date Value Ref Range Status  09/18/2018 >60 >60 mL/min Final   eGFR  Date Value Ref Range Status  12/07/2022 86 > OR = 60 mL/min/1.43m2 Final         Passed - Valid encounter within last 6 months    Recent Outpatient Visits           2 months ago Encounter for general adult medical examination with abnormal findings   [place] [facility] [place], Salvadore Oxford, NP   6 months ago Chest pain, unspecified type   [facility] [place], Salvadore Oxford, NP   7 months ago Vaginal bleeding   [place] Mecum, Erin E, PA-C   9 months ago Diverticulitis of large intestine without bleeding, unspecified complication status   [place] [facility] Mecum, [place] E, [place]   10 months ago Benign essential hypertension   [place] [place], [place]  W, NP       Future Appointments             In 3 days [place], Salvadore Oxford, NP [place] [facility]   In 3 months [place], Salvadore Oxford, NP [facility], [place]            Signed Prescriptions Disp Refills   simvastatin (ZOCOR) 20 MG tablet 90 tablet 2    Sig: TAKE 1 TABLET BY MOUTH EVERY EVENING     Cardiovascular:  Antilipid - Statins Failed - 03/05/2023  1:29 PM      Failed - Lipid Panel in normal range within the last 12 months    Cholesterol  Date Value Ref Range Status  12/07/2022 132 <200 mg/dL Final   LDL Cholesterol (Calc)  Date Value Ref Range Status  12/07/2022 60 mg/dL (calc) Final    Comment:    Reference range: <100 . Desirable range <100 mg/dL for primary prevention;   <70 mg/dL for patients with CHD or diabetic patients  with > or = 2 CHD risk factors. Marland Kitchen LDL-C is now calculated using the Martin-Hopkins  calculation, which is a validated novel method providing  better accuracy than the Friedewald equation in the   estimation of LDL-C.  Horald Pollen et al. Lenox Ahr. 2440;102(72): 2061-2068  (http://education.[facility].com/faq/FAQ164)    HDL  Date Value Ref Range Status  12/07/2022 52 > OR = 50 mg/dL Final   Triglycerides  Date Value Ref Range Status  12/07/2022 116 <150 mg/dL Final         Passed - Patient is not pregnant      Passed - Valid encounter within last 12 months    Recent Outpatient Visits           2 months ago Encounter for general adult medical examination with abnormal findings   [place] [facility] [place], Salvadore Oxford, NP   6 months ago Chest pain, unspecified type   [place] [facility], Salvadore Oxford, NP   7 months ago Vaginal bleeding   [place] [facility] Mecum, Oswaldo Conroy, PA-C   9 months ago Diverticulitis of large intestine without bleeding, unspecified complication status   [place] [facility] Mecum, [place] E, [place]   10 months ago Benign essential hypertension   [place] [facility] [place], Salvadore Oxford, NP       Future Appointments             In 3 days [place], Salvadore Oxford, NP [place] [facility], [facility]   In 3 months [place], Salvadore Oxford, NP [facility] [place], [facility]

## 2023-03-06 NOTE — Telephone Encounter (Signed)
Requested Prescriptions  Pending Prescriptions Disp Refills   metFORMIN (GLUCOPHAGE) 500 MG tablet [Pharmacy Med Name: METFORMIN HCL 500 MG TAB] 270 tablet     Sig: TAKE 2 TABLETS BY MOUTH DAILY WITH BREAKFAST AND 1 TABLET IN THE EVENING     Endocrinology:  Diabetes - Biguanides Failed - 03/05/2023  1:29 PM      Failed - B12 Level in normal range and within 720 days    No results found for: "VITAMINB12"       Failed - CBC within normal limits and completed in the last 12 months    WBC  Date Value Ref Range Status  12/07/2022 8.6 3.8 - 10.8 Thousand/uL Final   RBC  Date Value Ref Range Status  12/07/2022 5.02 3.80 - 5.10 Million/uL Final   Hemoglobin  Date Value Ref Range Status  12/07/2022 11.1 (L) 11.7 - 15.5 g/dL Final   HCT  Date Value Ref Range Status  12/07/2022 36.0 35.0 - 45.0 % Final   MCHC  Date Value Ref Range Status  12/07/2022 30.8 (L) 32.0 - 36.0 g/dL Final   [facility]  Date Value Ref Range Status  12/07/2022 22.1 (L) 27.0 - 33.0 pg Final   MCV  Date Value Ref Range Status  12/07/2022 71.7 (L) 80.0 - 100.0 fL Final   No results found for: "PLTCOUNTKUC", "LABPLAT", "POCPLA" RDW  Date Value Ref Range Status  12/07/2022 16.1 (H) 11.0 - 15.0 % Final         Passed - Cr in normal range and within 360 days    Creat  Date Value Ref Range Status  12/07/2022 0.75 0.50 - 1.05 mg/dL Final   Creatinine, Urine  Date Value Ref Range Status  05/08/2022 53 20 - 275 mg/dL Final         Passed - HBA1C is between 0 and 7.9 and within 180 days    HbA1c, POC (controlled diabetic range)  Date Value Ref Range Status  12/07/2022 7.4 (A) 0.0 - 7.0 % Final         Passed - eGFR in normal range and within 360 days    GFR calc Af Amer  Date Value Ref Range Status  09/18/2018 >60 >60 mL/min Final   GFR calc non Af Amer  Date Value Ref Range Status  09/18/2018 >60 >60 mL/min Final   eGFR  Date Value Ref Range Status  12/07/2022 86 > OR = 60 mL/min/1.68m2 Final          Passed - Valid encounter within last 6 months    Recent Outpatient Visits           2 months ago Encounter for general adult medical examination with abnormal findings   [place] [facility] [place], Salvadore Oxford, NP   6 months ago Chest pain, unspecified type   [place] [facility] [place], Salvadore Oxford, NP   7 months ago Vaginal bleeding   [place] [facility] Mecum, Oswaldo Conroy, PA-C   9 months ago Diverticulitis of large intestine without bleeding, unspecified complication status   [facility] Mecum, [place] E, [place]   10 months ago Benign essential hypertension   [place] [facility] [place], Salvadore Oxford, NP       Future Appointments             In 3 days [place], Salvadore Oxford, NP [place] [facility]   In  3 months [place], Salvadore Oxford, NP [place] [facility], [facility]             simvastatin (ZOCOR) 20 MG tablet [Pharmacy Med Name: SIMVASTATIN 20 MG TAB] 90 tablet 2    Sig: TAKE 1 TABLET BY MOUTH EVERY EVENING     Cardiovascular:  Antilipid - Statins Failed - 03/05/2023  1:29 PM      Failed - Lipid Panel in normal range within the last 12 months    Cholesterol  Date Value Ref Range Status  12/07/2022 132 <200 mg/dL Final   LDL Cholesterol (Calc)  Date Value Ref Range Status  12/07/2022 60 mg/dL (calc) Final    Comment:    Reference range: <100 . Desirable range <100 mg/dL for primary prevention;   <70 mg/dL for patients with CHD or diabetic patients  with > or = 2 CHD risk factors. Marland Kitchen LDL-C is now calculated using the Martin-Hopkins  calculation, which is a validated novel method providing  better accuracy than the Friedewald equation in the  estimation of LDL-C.  Horald Pollen et al. Lenox Ahr. 2956;213(08): 2061-2068  (http://education.[facility].com/faq/FAQ164)    HDL  Date Value Ref Range Status  12/07/2022 52 > OR = 50 mg/dL Final    Triglycerides  Date Value Ref Range Status  12/07/2022 116 <150 mg/dL Final         Passed - Patient is not pregnant      Passed - Valid encounter within last 12 months    Recent Outpatient Visits           2 months ago Encounter for general adult medical examination with abnormal findings   [place] [facility] [place], Salvadore Oxford, NP   6 months ago Chest pain, unspecified type   [place] [facility] [place], Salvadore Oxford, NP   7 months ago Vaginal bleeding   [place] [facility] Mecum, Oswaldo Conroy, PA-C   9 months ago Diverticulitis of large intestine without bleeding, unspecified complication status   [place] [facility] Mecum, [place] E, [place]   10 months ago Benign essential hypertension   [place] [facility] [place], Salvadore Oxford, NP       Future Appointments             In 3 days [place], Salvadore Oxford, NP [place] [facility]   In 3 months [place], Salvadore Oxford, NP [facility], [facility]

## 2023-03-07 ENCOUNTER — Other Ambulatory Visit: Payer: 59

## 2023-03-09 ENCOUNTER — Ambulatory Visit: Payer: 59 | Admitting: Internal Medicine

## 2023-03-09 NOTE — Progress Notes (Deleted)
Subjective:    Patient ID: Stacy Cunningham, female    DOB: 06/21/1950, 72 y.o.   MRN: [id number]  HPI    Review of Systems   Past Medical History:  Diagnosis Date   Anemia    Borderline diabetes mellitus    Diabetes mellitus without complication (HCC)    Elevated lipids    GERD (gastroesophageal reflux disease)    Glaucoma    Headache    h/o migraines   Hypertension    Neuropathy    Palpitations    a. 05/2020 Zio: Predominantly sinus rhythm, 80 (60-148).  1 SVT run-4 beats at 148.  Rare PACs and PVCs; b. 04/2021 Zio: Predominantly sinus rhythm, 80 (59-140).  Rare PACs/PVCs.   Panic attack     Current Outpatient Medications  Medication Sig Dispense Refill   aspirin 81 MG EC tablet Take 81 mg by mouth daily.     Blood Glucose Monitoring Suppl (ACCU-CHEK GUIDE ME) w/Device KIT      brimonidine (ALPHAGAN) 0.15 % ophthalmic solution Place 1 drop into both eyes 3 (three) times daily.     clotrimazole-betamethasone (LOTRISONE) cream Apply topically 2 (two) times daily. 30 g 0   dorzolamide-timolol (COSOPT) 22.3-6.8 MG/ML ophthalmic solution Place 1 drop into both eyes at bedtime.     famotidine (PEPCID) 40 MG tablet Take 1 tablet (40 mg total) by mouth daily. 90 tablet 2   ferrous sulfate 325 (65 FE) MG tablet Take 1 tablet (325 mg total) by mouth 2 (two) times daily with a meal. 180 tablet 1   fexofenadine (ALLEGRA ALLERGY) 180 MG tablet Take 1 tablet (180 mg total) by mouth daily. 90 tablet 0   gabapentin (NEURONTIN) 800 MG tablet Take one tablet AM, one PM, two QHS 360 tablet 3   hydrochlorothiazide (HYDRODIURIL) 25 MG tablet TAKE 1 TABLET BY MOUTH DAILY 90 tablet 1   lidocaine (LIDODERM) 5 % Place 1 patch onto the skin daily. Remove & Discard patch within 12 hours or as directed by MD 90 patch 1   lisinopril (ZESTRIL) 2.5 MG tablet TAKE 1 TABLET BY MOUTH DAILY FOR KIDNEY PROTECTION 90 tablet 1   Magnesium Oxide 400 MG CAPS Take 1 capsule (400 mg total) by mouth daily at 12 noon.  90 capsule 0   metFORMIN (GLUCOPHAGE) 500 MG tablet TAKE 2 TABLETS BY MOUTH DAILY WITH BREAKFAST AND 1 TABLET IN THE EVENING 270 tablet 0   methocarbamol (ROBAXIN) 500 MG tablet Take 1 tablet (500 mg total) by mouth every 8 (eight) hours as needed for muscle spasms. 30 tablet 0   nortriptyline (PAMELOR) 10 MG capsule Take 10 mg by mouth 2 (two) times daily.     omeprazole (PRILOSEC) 20 MG capsule TAKE 1 CAPSULE BY MOUTH DAILY 90 capsule 0   propranolol (INDERAL) 20 MG tablet Take 1 tablet (20 mg total) by mouth 2 (two) times daily. 180 tablet 0   ROCKLATAN 0.02-0.005 % SOLN Apply to eye.     simvastatin (ZOCOR) 20 MG tablet TAKE 1 TABLET BY MOUTH EVERY EVENING 90 tablet 2   No current facility-administered medications for this visit.    Allergies  Allergen Reactions   Sulfa Antibiotics Dermatitis   Darvon [Propoxyphene] Hives   Other Other (See Comments) and Rash    PAPER TAPE OK TO USE PAPER TAPE OK TO USE    Tape Rash    PAPER TAPE OK TO USE    Family History  Problem Relation Age of Onset   Breast  cancer Sister 34   Heart attack Mother 30   Hyperlipidemia Mother    Hypertension Mother     Social History   Socioeconomic History   Marital status: Married    Spouse name: Not on file   Number of children: Not on file   Years of education: Not on file   Highest education level: Not on file  Occupational History   Not on file  Tobacco Use   Smoking status: Never   Smokeless tobacco: Never  Vaping Use   Vaping status: Never Used  Substance and Sexual Activity   Alcohol use: Not Currently    Comment: rare   Drug use: No   Sexual activity: Yes  Other Topics Concern   Not on file  Social History Narrative   Not on file   Social Determinants of Health   Financial Resource Strain: Low Risk  (09/29/2022)   Overall Financial Resource Strain (CARDIA)    Difficulty of Paying Living Expenses: Not hard at all  Food Insecurity: No Food Insecurity (09/29/2022)   Hunger Vital  Sign    Worried About Running Out of Food in the Last Year: Never true    Ran Out of Food in the Last Year: Never true  Transportation Needs: No Transportation Needs (09/29/2022)   PRAPARE - Administrator, Civil Service (Medical): No    Lack of Transportation (Non-Medical): No  Physical Activity: Insufficiently Active (09/29/2022)   Exercise Vital Sign    Days of Exercise per Week: 3 days    Minutes of Exercise per Session: 10 min  Stress: No Stress Concern Present (09/29/2022)   [employer] of Occupational Health - Occupational Stress Questionnaire    Feeling of Stress : Not at all  Social Connections: Moderately Integrated (09/29/2022)   Social Connection and Isolation Panel [NHANES]    Frequency of Communication with Friends and Family: More than three times a week    Frequency of Social Gatherings with Friends and Family: More than three times a week    Attends Religious Services: More than 4 times per year    Active Member of [employer] or Organizations: No    Attends Banker Meetings: Never    Marital Status: Married  Catering manager Violence: Not At Risk (09/29/2022)   Humiliation, Afraid, Rape, and Kick questionnaire    Fear of Current or Ex-Partner: No    Emotionally Abused: No    Physically Abused: No    Sexually Abused: No     Constitutional: Patient reports headache.  Denies fever, malaise, fatigue, or abrupt weight changes.  HEENT: Denies eye pain, eye redness, ear pain, ringing in the ears, wax buildup, runny nose, nasal congestion, bloody nose, or sore throat. Respiratory: Denies difficulty breathing, shortness of breath, cough or sputum production.   Cardiovascular: Denies chest pain, chest tightness, palpitations or swelling in the hands or feet.  Gastrointestinal: Denies abdominal pain, bloating, constipation, diarrhea or blood in the stool.  GU: Denies urgency, frequency, pain with urination, burning sensation, blood in urine, odor or  discharge. Musculoskeletal: Denies decrease in range of motion, difficulty with gait, muscle pain or joint pain and swelling.  Skin: Denies redness, rashes, lesions or ulcercations.  Neurological: Denies dizziness, difficulty with memory, difficulty with speech or problems with balance and coordination.  Psych: Denies anxiety, depression, SI/HI.  No other specific complaints in a complete review of systems (except as listed in HPI above).      Objective:   Physical Exam  There were no vitals taken for this visit. Wt Readings from Last 3 Encounters:  12/07/22 201 lb (91.2 kg)  09/29/22 201 lb (91.2 kg)  08/31/22 201 lb (91.2 kg)    General: Appears their stated age, well developed, well nourished in NAD. Skin: Warm, dry and intact. No rashes, lesions or ulcerations noted. HEENT: Head: normal shape and size; Eyes: sclera white, no icterus, conjunctiva pink, PERRLA and EOMs intact; Ears: Tm's gray and intact, normal light reflex; Nose: mucosa pink and moist, septum midline; Throat/Mouth: Teeth present, mucosa pink and moist, no exudate, lesions or ulcerations noted.  Neck:  Neck supple, trachea midline. No masses, lumps or thyromegaly present.  Cardiovascular: Normal rate and rhythm. S1,S2 noted.  No murmur, rubs or gallops noted. No JVD or BLE edema. No carotid bruits noted. Pulmonary/Chest: Normal effort and positive vesicular breath sounds. No respiratory distress. No wheezes, rales or ronchi noted.  Abdomen: Soft and nontender. Normal bowel sounds. No distention or masses noted. Liver, spleen and kidneys non palpable. Musculoskeletal: Normal range of motion. No signs of joint swelling. No difficulty with gait.  Neurological: Alert and oriented. Cranial nerves II-XII grossly intact. Coordination normal.  Psychiatric: Mood and affect normal. Behavior is normal. Judgment and thought content normal.    BMET    Component Value Date/Time   NA 140 12/07/2022 0920   K 4.8 12/07/2022 0920    CL 101 12/07/2022 0920   CO2 31 12/07/2022 0920   GLUCOSE 136 (H) 12/07/2022 0920   BUN 11 12/07/2022 0920   CREATININE 0.75 12/07/2022 0920   CALCIUM 9.5 12/07/2022 0920   GFRNONAA >60 09/18/2018 1030   GFRAA >60 09/18/2018 1030    Lipid Panel     Component Value Date/Time   CHOL 132 12/07/2022 0920   TRIG 116 12/07/2022 0920   HDL 52 12/07/2022 0920   CHOLHDL 2.5 12/07/2022 0920   LDLCALC 60 12/07/2022 0920    CBC    Component Value Date/Time   WBC 8.6 12/07/2022 0920   RBC 5.02 12/07/2022 0920   HGB 11.1 (L) 12/07/2022 0920   HCT 36.0 12/07/2022 0920   PLT 285 12/07/2022 0920   MCV 71.7 (L) 12/07/2022 0920   MCH 22.1 (L) 12/07/2022 0920   MCHC 30.8 (L) 12/07/2022 0920   RDW 16.1 (H) 12/07/2022 0920   LYMPHSABS 2.4 06/05/2018 0608   MONOABS 0.5 06/05/2018 0608   EOSABS 0.1 06/05/2018 0608   BASOSABS 0.1 06/05/2018 0608    Hgb A1C Lab Results  Component Value Date   HGBA1C 7.4 (A) 12/07/2022            Assessment & Plan:   RTC in 3 months for follow-up of chronic conditions [name], NP  There are no diagnoses linked to this encounter.

## 2023-03-15 ENCOUNTER — Ambulatory Visit: Payer: Self-pay

## 2023-03-15 ENCOUNTER — Telehealth: Payer: Self-pay | Admitting: Neurosurgery

## 2023-03-15 NOTE — Telephone Encounter (Signed)
Yesterday She was raising up to get out of bed and she felt something pop in her lower back and she has been unable to walk well, she can sit but raising up from sitting is very difficult. It's painful to turn over in the bed. She has tried muscle relaxer's and pain patches. I offered for her to go to the Urgent care and she was reluctant to go.

## 2023-03-15 NOTE — Telephone Encounter (Signed)
  Chief Complaint: Severe back pain following a "pop" from back yesterday afternoon Symptoms: Pain Frequency: ongoing back issues - much worse since yesterday afternoon Pertinent Negatives: Patient denies  Disposition: [] ED /[] Urgent Care (no appt availability in office) / [] Appointment(In office/virtual)/ []  [place] [facility]/ [] Home Care/ [] Refused Recommended Disposition /[] [place] [facility]/ [x]  Follow-up with PCP Additional Notes: Pt has taken her robaxin without relief. Last night she used a heating pad which she thinks made it worse. Pt states that pain shoots down right leg at times. Pt will use a lidocaine patch to see if that helps. Pt has a follow up appt with neuro for ongoing back issues. Pt would like to be worked in today.  Please advise.  Sent FC teams message:  Hi! Sending over an encounter for Stacy Cunningham [id number]. She has ongoing back pain issues. Yesterday when she stood up, she heard a pop and has been in extreme pain. [name] would like to be worked in asap. Please advise.    Reason for Disposition  [1] SEVERE back pain (e.g., excruciating, unable to do any normal activities) AND [2] not improved 2 hours after pain medicine  Answer Assessment - Initial Assessment Questions 1. ONSET: "When did the pain begin?"      Yesterday afternoon 2. LOCATION: "Where does it hurt?" (upper, mid or lower back)     Lower back  3. SEVERITY: "How bad is the pain?"  (e.g., Scale 1-10; mild, moderate, or severe)   - MILD (1-3): Doesn't interfere with normal activities.    - MODERATE (4-7): Interferes with normal activities or awakens from sleep.    - SEVERE (8-10): Excruciating pain, unable to do any normal activities.      severe 4. PATTERN: "Is the pain constant?" (e.g., yes, no; constant, intermittent)      yes 5. RADIATION: "Does the pain shoot into your legs or somewhere else?"     Runs down right leg. 6. CAUSE:  "What do you think is causing the back pain?"       Fluid on each side of her spine. 7. BACK OVERUSE:  "Any recent lifting of heavy objects, strenuous work or exercise?"     no 8. MEDICINES: "What have you taken so far for the pain?" (e.g., nothing, acetaminophen, NSAIDS)     Robaxin, heating pad 9. NEUROLOGIC SYMPTOMS: "Do you have any weakness, numbness, or problems with bowel/bladder control?"     no 10. OTHER SYMPTOMS: "Do you have any other symptoms?" (e.g., fever, abdomen pain, burning with urination, blood in urine)       no  Protocols used: Back Pain-A-AH

## 2023-03-15 NOTE — Telephone Encounter (Signed)
Patient is calling our office know that she felt something pop in her back yesterday and is in a lot of pain. She states that she can barely raise her arm up. She completed PT on 01/23/2023 at [facility]. Could she be added on this afternoon if she is able to come? Please advise.

## 2023-03-15 NOTE — Telephone Encounter (Signed)
Need more information.  Left message to return call

## 2023-03-15 NOTE — Telephone Encounter (Signed)
Spoke to patient and per Stacy Cunningham since she is having trouble walking she may need to go to the ER,they will be able to get images and try to get her pain under control. She states she may go to the [facility] Urgent care or call her other doctor. I told her to call us if she gets images. Patient voiced understanding.

## 2023-03-15 NOTE — Telephone Encounter (Signed)
Returning your call. She was on the phone with her PCP who recommended that she see Dr.Yarbrough.

## 2023-03-16 NOTE — Telephone Encounter (Signed)
I will work her in today but would prefer if she could come in sometime this morning before we break for lunch.

## 2023-03-16 NOTE — Telephone Encounter (Signed)
Attempted to reach patient to schedule her for this morning. No answer, LM to call back.

## 2023-03-16 NOTE — Telephone Encounter (Signed)
  Media Information   Document Information  AMB Correspondence  NEUROSURGERY AFTER HOURS ADVICE  03/16/2023 08:35  Attached To:  Domenic Polite  Source Information  Default, Provider, MD  Document History

## 2023-03-19 DIAGNOSIS — H401123 Primary open-angle glaucoma, left eye, severe stage: Secondary | ICD-10-CM | POA: Insufficient documentation

## 2023-03-20 ENCOUNTER — Encounter: Payer: Self-pay | Admitting: Neurosurgery

## 2023-03-20 ENCOUNTER — Other Ambulatory Visit: Payer: Self-pay

## 2023-03-20 ENCOUNTER — Ambulatory Visit (INDEPENDENT_AMBULATORY_CARE_PROVIDER_SITE_OTHER): Payer: 59 | Admitting: Neurosurgery

## 2023-03-20 ENCOUNTER — Ambulatory Visit
Admission: RE | Admit: 2023-03-20 | Discharge: 2023-03-20 | Disposition: A | Discharge status: Home / Self Care | Payer: Self-pay | Source: Ambulatory Visit | Attending: Neurosurgery | Admitting: Neurosurgery

## 2023-03-20 VITALS — BP 108/64 | Ht 66.0 in | Wt 201.0 lb

## 2023-03-20 DIAGNOSIS — M48062 Spinal stenosis, lumbar region with neurogenic claudication: Secondary | ICD-10-CM | POA: Diagnosis not present

## 2023-03-20 DIAGNOSIS — Z049 Encounter for examination and observation for unspecified reason: Secondary | ICD-10-CM

## 2023-03-20 MED ORDER — CYCLOBENZAPRINE HCL 5 MG PO TABS: 5.0000 mg | ORAL_TABLET | Freq: Three times a day (TID) | ORAL | 0 refills | Status: AC

## 2023-03-20 MED ORDER — IBUPROFEN 600 MG PO TABS: 600.0000 mg | ORAL_TABLET | Freq: Three times a day (TID) | ORAL | 2 refills | Status: DC | PRN

## 2023-03-20 NOTE — Progress Notes (Signed)
Referring Physician:  Lorre Munroe, NP [address] [place],  [place] [address]  Primary Physician:  Lorre Munroe, NP  History of Present Illness: 03/20/2023 Stacy Cunningham returns to see me.  She had an exacerbation of her pain last Friday.  She was seen in urgent care.  She presents today for reevaluation  She had pain down her right leg.  It went down the back and side of her upper leg to around her knee.  She is feeling somewhat better right now.   08/03/2022 She has only been to 1 visit of physical therapy.  Her symptoms are the same.  06/06/2022 Stacy Cunningham is here today with a chief complaint of bilateral low back pain with radiation to the bilateral buttock, posterior lateral thighs and calves along with numbness, tingling, burning, pain in bilateral legs and feet.  He has had symptoms for many years, but worsening over the past 1 to 2 years.  She reports numbness and tingling in her legs with sharp pain at times.  Standing and walking make it worse.  Rest, sitting, and leaning over grocery cart make it better.  She denies weakness or bowel or bladder issues.  EMG 01/05/20 by Dr Malvin Johns    Conservative measures:  Physical therapy: has not participated in but has been referred to [facility] and has her initial evaluation on 06/13/22.  Multimodal medical therapy including regular antiinflammatories:  tylenol, cyclobenzaprine, gabapentin, ibuprofen, lidocaine patches, lyrica, baclofen Injections: has received epidural steroid injections 03/03/2021: Bilateral L4-5 transforaminal ESI (about 6 weeks of good relief) 07/15/2020: Bilateral L4-5 transforaminal ESI (good relief) 06/04/2020: Right hip joint injection performed by Dr. Landry Mellow (no relief)  02/12/2020: Bilateral S1 transforaminal ESI (little relief)  08/12/2019: Bilateral S1 transforaminal ESI (good relief)  06/18/2019: Bilateral S1 transforaminal ESI (3 days moderate relief the return of symptoms)    Past Surgery:  C5-6 ACDF by Dr Adriana Simas in 2020  Stacy Cunningham has no symptoms of cervical myelopathy.  The symptoms are causing a significant impact on the patient's life.   I have utilized the care everywhere function in epic to review the outside records available from external health systems.  Review of Systems:  A 10 point review of systems is negative, except for the pertinent positives and negatives detailed in the HPI.  Past Medical History: Past Medical History:  Diagnosis Date   Anemia    Borderline diabetes mellitus    Diabetes mellitus without complication (HCC)    Elevated lipids    GERD (gastroesophageal reflux disease)    Glaucoma    Headache    h/o migraines   Hypertension    Neuropathy    Palpitations    a. 05/2020 Zio: Predominantly sinus rhythm, 80 (60-148).  1 SVT run-4 beats at 148.  Rare PACs and PVCs; b. 04/2021 Zio: Predominantly sinus rhythm, 80 (59-140).  Rare PACs/PVCs.   Panic attack     Past Surgical History: Past Surgical History:  Procedure Laterality Date   ANTERIOR CERVICAL DECOMP/DISCECTOMY FUSION N/A 09/30/2018   Procedure: ANTERIOR CERVICAL DECOMPRESSION/DISCECTOMY FUSION 1 LEVEL - C 5/6;  Surgeon: Lucy Chris, MD;  Location: [facility] ORS;  Service: Neurosurgery;  Laterality: N/A;   CATARACT EXTRACTION W/PHACO Right 02/15/2022   Procedure: CATARACT EXTRACTION PHACO AND INTRAOCULAR LENS PLACEMENT (IOC) RIGHT KAHOOK DUAL BLADE GONIOTOMY DIABETIC 6.21 01:05.9;  Surgeon: Lockie Mola, MD;  Location: [facility];  Service: Ophthalmology;  Laterality: Right;   CHOLECYSTECTOMY N/A 02/06/2018   Procedure: LAPAROSCOPIC  CHOLECYSTECTOMY;  Surgeon: Carolan Shiver, MD;  Location: [facility] ORS;  Service: General;  Laterality: N/A;   COLONOSCOPY WITH PROPOFOL N/A 07/13/2017   Procedure: COLONOSCOPY WITH PROPOFOL;  Surgeon: Pasty Spillers, MD;  Location: [facility] ENDOSCOPY;  Service: Endoscopy;  Laterality: N/A;   COLONOSCOPY WITH PROPOFOL N/A 06/19/2022    Procedure: COLONOSCOPY WITH PROPOFOL;  Surgeon: Wyline Mood, MD;  Location[facility];  Service: Gastroenterology;  Laterality: N/A;   ESOPHAGOGASTRODUODENOSCOPY (EGD) WITH PROPOFOL N/A 09/14/2017   Procedure: ESOPHAGOGASTRODUODENOSCOPY (EGD) WITH PROPOFOL;  Surgeon: Pasty Spillers, MD;  Location: [facility] ENDOSCOPY;  Service: Endoscopy;  Laterality: N/A;    Allergies: Allergies as of 03/20/2023 - Review Complete 03/20/2023  Allergen Reaction Noted   Sulfa antibiotics Dermatitis 07/27/2016   Darvon [propoxyphene] Hives 06/17/2014   Other Other (See Comments) and Rash 02/01/2018   Tape Rash 02/01/2018    Medications: Current Meds  Medication Sig   aspirin 81 MG EC tablet Take 81 mg by mouth daily.   Blood Glucose Monitoring Suppl (ACCU-CHEK GUIDE ME) w/Device KIT    brimonidine (ALPHAGAN) 0.15 % ophthalmic solution Place 1 drop into both eyes 3 (three) times daily.   clotrimazole-betamethasone (LOTRISONE) cream Apply topically 2 (two) times daily.   cyclobenzaprine (FLEXERIL) 10 MG tablet Take 10 mg by mouth 3 (three) times daily.   dorzolamide-timolol (COSOPT) 22.3-6.8 MG/ML ophthalmic solution Place 1 drop into both eyes at bedtime.   famotidine (PEPCID) 40 MG tablet Take 1 tablet (40 mg total) by mouth daily.   ferrous sulfate 325 (65 FE) MG tablet Take 1 tablet (325 mg total) by mouth 2 (two) times daily with a meal.   fexofenadine (ALLEGRA ALLERGY) 180 MG tablet Take 1 tablet (180 mg total) by mouth daily.   gabapentin (NEURONTIN) 800 MG tablet Take one tablet AM, one PM, two QHS   hydrochlorothiazide (HYDRODIURIL) 25 MG tablet TAKE 1 TABLET BY MOUTH DAILY   HYDROcodone-acetaminophen (NORCO/VICODIN) 5-325 MG tablet Take by mouth.   lidocaine (LIDODERM) 5 % Place 1 patch onto the skin daily. Remove & Discard patch within 12 hours or as directed by MD   lisinopril (ZESTRIL) 2.5 MG tablet TAKE 1 TABLET BY MOUTH DAILY FOR KIDNEY PROTECTION   Magnesium Oxide 400 MG CAPS Take 1  capsule (400 mg total) by mouth daily at 12 noon.   metFORMIN (GLUCOPHAGE) 500 MG tablet TAKE 2 TABLETS BY MOUTH DAILY WITH BREAKFAST AND 1 TABLET IN THE EVENING   nortriptyline (PAMELOR) 10 MG capsule Take 10 mg by mouth 2 (two) times daily.   omeprazole (PRILOSEC) 20 MG capsule TAKE 1 CAPSULE BY MOUTH DAILY   propranolol (INDERAL) 20 MG tablet Take 1 tablet (20 mg total) by mouth 2 (two) times daily.   ROCKLATAN 0.02-0.005 % SOLN Apply to eye.   simvastatin (ZOCOR) 20 MG tablet TAKE 1 TABLET BY MOUTH EVERY EVENING    Social History: Social History   Tobacco Use   Smoking status: Never   Smokeless tobacco: Never  Vaping Use   Vaping status: Never Used  Substance Use Topics   Alcohol use: Not Currently    Comment: rare   Drug use: No    Family Medical History: Family History  Problem Relation Age of Onset   Breast cancer Sister 40   Heart attack Mother 69   Hyperlipidemia Mother    Hypertension Mother     Physical Examination: Vitals:   03/20/23 1551  BP: 108/64     General: Patient is well developed, well nourished,  calm, collected, and in no apparent distress. Attention to examination is appropriate.  Neck:   Supple.  Full range of motion.  Respiratory: Patient is breathing without any difficulty.   NEUROLOGICAL:     Awake, alert, oriented to person, place, and time.  Speech is clear and fluent.   Cranial Nerves: Pupils equal round and reactive to light.  Facial tone is symmetric.  Facial sensation is symmetric. Shoulder shrug is symmetric. Tongue protrusion is midline.  There is no pronator drift.  ROM of spine: full.    Strength: Side Biceps Triceps Deltoid Interossei Grip Wrist Ext. Wrist Flex.  R 5 5 5 5 5 5 5   L 5 5 5 5 5 5 5    Side Iliopsoas Quads Hamstring PF DF EHL  R 5 5 5 5 5 5   L 5 5 5 5 5 5    Reflexes are 1+ and symmetric at the biceps, triceps, brachioradialis, patella and achilles.   Hoffman's is absent.   Bilateral upper and lower  extremity sensation is intact to light touch.    No evidence of dysmetria noted.  Gait is antalgic-she uses a cane.     Medical Decision Making  Imaging: MRI L spine 05/26/22 IMPRESSION: 1. Overall stable multilevel disc disease and facet disease. 2. Stable moderate spinal and moderately severe bilateral lateral recess stenosis at L2-3. 3. Stable severe spinal and bilateral lateral recess stenosis at L3-4. 4. Stable moderately severe spinal and bilateral lateral recess stenosis at L4-5. 5. Persistent moderate-sized central disc protrusion at L5-S1 with mass effect on the ventral thecal sac and potential irritation of both S1 nerve roots.     Electronically Signed   By: Rudie Meyer M.D.   On: 05/27/2022 19:18  I have personally reviewed the images and agree with the above interpretation.  Assessment and Plan: Stacy Cunningham is a pleasant 72 y.o. female with multilevel lumbar stenosis causing neurogenic claudication.  She had an exacerbation of her symptoms on Friday, but has had improvement in her symptoms.  She cannot take steroids due to recent eye surgery.  I have recommended that she try ibuprofen 600 mg 3 times a day as needed in addition to Flexeril 5 to 10 mg 3 times daily as needed.  Will touch base with her on Monday.  If she continues to improve, she may not need additional treatment.  If she is still having significant symptoms, we will send her for physical therapy and consider injections.  She may not want to pursue injections, as she has had them multiple times without improvement most recently.  I spent a total of 10 minutes in this patient's care today. This time was spent reviewing pertinent records including imaging studies, obtaining and confirming history, performing a directed evaluation, formulating and discussing my recommendations, and documenting the visit within the medical record.    Thank you for involving me in the care of this patient.      Saharsh Sterling  K. Myer Haff MD, [facility] Neurosurgery

## 2023-03-27 ENCOUNTER — Inpatient Hospital Stay: Admission: RE | Admit: 2023-03-27 | Payer: 59 | Source: Ambulatory Visit

## 2023-03-28 ENCOUNTER — Telehealth: Payer: Self-pay

## 2023-03-28 DIAGNOSIS — M48062 Spinal stenosis, lumbar region with neurogenic claudication: Secondary | ICD-10-CM

## 2023-03-28 NOTE — Telephone Encounter (Signed)
-----   Message from [facility] sent at 03/20/2023  4:16 PM EST ----- Hey-  Can you touch base with her and see how her symptoms are going?  If she is doing better, we will continue to watch.  If she is not, I would like to send her for physical therapy.  Thanks [employer]

## 2023-03-28 NOTE — Telephone Encounter (Addendum)
I spoke with the patient. She reports she is still experiencing some stiffness. She has been using a heating pad, which has been helping some, but only for the duration of use (then the pain returns). She states she does not want to do more physical therapy and she is not interested in injections. She would like to know if surgery is an option.  In regards to PT, she went to 1 visit at [facility] in 05/2022 and 3 visits at [facility] from 12/18/22-01/23/23. She will contact [employer] to have them send a copy of their note.

## 2023-03-28 NOTE — Telephone Encounter (Signed)
Patient called to let our office know that she spoke with [employer] and they are going to fax the note to our office. Patient states she would like you to call her back after you speak with Dr. Myer Haff.

## 2023-04-02 ENCOUNTER — Ambulatory Visit: Payer: Self-pay

## 2023-04-02 NOTE — Telephone Encounter (Signed)
Summary: Diarrhea for 3 days   Pt reporting uncontrolled diarrhea for 3 days. Pt states she notices blood after wiping, believes its due to hemorrhoids. Pt requesting appointment. Did not see any available same day appt soon nor office visit.  Pt seeking clinical advice      Called pt - left message on machine to return our call.

## 2023-04-02 NOTE — Telephone Encounter (Signed)
Summary: Diarrhea for 3 days   Pt reporting uncontrolled diarrhea for 3 days. Pt states she notices blood after wiping, believes its due to hemorrhoids. Pt requesting appointment. Did not see any available same day appt soon nor office visit.  Pt seeking clinical advice     Called pt - left message on Machine to return our call.

## 2023-04-02 NOTE — Telephone Encounter (Signed)
I spoke with Ms Enslow. She asked that I let Dr Myer Haff know that she fell a few days ago because her legs were numb. I informed her that Dr Myer Haff advises she return to PT until she is formally discharged. I asked her to contact us when she is formally discharged from PT. She verbalized understanding. A new referral has been placed to [facility] per her request.

## 2023-04-02 NOTE — Telephone Encounter (Signed)
Summary: Diarrhea for 3 days   Pt reporting uncontrolled diarrhea for 3 days. Pt states she notices blood after wiping, believes its due to hemorrhoids. Pt requesting appointment. Did not see any available same day appt soon nor office visit.  Pt seeking clinical advice      Called pt - left message on machine to return our call. I will forward this encounter to the clinic for follow up.

## 2023-04-03 NOTE — Telephone Encounter (Signed)
Recommend Imodium as needed for diarrhea and Preparation H cream or suppositories for possible hemorrhoids.  If symptoms persist or worsen, recommend evaluation in the office, urgent care or ER

## 2023-04-04 NOTE — Telephone Encounter (Signed)
Patients diarrhea has stopped.   Informed patient if it started back to take Imodium and use Preparation H.   Verbal understanding.

## 2023-04-09 ENCOUNTER — Other Ambulatory Visit: Payer: Self-pay | Admitting: Internal Medicine

## 2023-04-10 NOTE — Telephone Encounter (Signed)
Requested medication (s) are due for refill today: Yes  Requested medication (s) are on the active medication list: Yes  Last refill:  10/17/22  Future visit scheduled: Yes  Notes to clinic:  Manual review.    Requested Prescriptions  Pending Prescriptions Disp Refills   clotrimazole-betamethasone (LOTRISONE) cream [Pharmacy Med Name: CLOTRIMAZOLE-BETAMETHASONE 1-0.05%] 45 g     Sig: APPLY TOPICALLY TO AFFECTED AREAS 2 TIMES DAILY     Off-Protocol Failed - 04/10/2023 11:24 AM      Failed - Medication not assigned to a protocol, review manually.      Passed - Valid encounter within last 12 months    Recent Outpatient Visits           4 months ago Encounter for general adult medical examination with abnormal findings   [facility] [place], Salvadore Oxford, NP   7 months ago Chest pain, unspecified type   [place] [facility] [place], Salvadore Oxford, NP   8 months ago Vaginal bleeding   [facility] M[place], Oswaldo Conroy, PA-C   11 months ago Diverticulitis of large intestine without bleeding, unspecified complication status   [facility] Mecum, Oswaldo Conroy, [place]   11 months ago Benign essential hypertension   [facility] [place], Salvadore Oxford, NP       Future Appointments             In 2 months Baity, Salvadore Oxford, NP  [facility], [facility]

## 2023-05-02 ENCOUNTER — Other Ambulatory Visit: Payer: Self-pay | Admitting: Internal Medicine

## 2023-05-02 NOTE — Telephone Encounter (Signed)
 Medication Refill -  Most Recent Primary Care Visit:  Provider: ANTONETTE ANGELINE ORN  Department: [facility]  Visit Type: PHYSICAL 20  Date: 12/07/2022  Medication: omeprazole  (PRILOSEC) 20 MG capsule ,   Has the patient contacted their pharmacy? Yes   Is this the correct pharmacy for this prescription? Yes If no, delete pharmacy and type the correct one.  This is the patient's preferred pharmacy:   [facility] - [place], [place] - [address] Phone: [phone]  Fax: [phone]      Has the prescription been filled recently? No  Is the patient out of the medication? No  Has the patient been seen for an appointment in the last year OR does the patient have an upcoming appointment? Yes  Can we respond through MyChart? Yes    Please assist patient further

## 2023-05-03 ENCOUNTER — Other Ambulatory Visit: Payer: Self-pay | Admitting: Otolaryngology

## 2023-05-03 ENCOUNTER — Telehealth: Payer: Self-pay | Admitting: Internal Medicine

## 2023-05-03 DIAGNOSIS — E041 Nontoxic single thyroid nodule: Secondary | ICD-10-CM

## 2023-05-03 DIAGNOSIS — R1314 Dysphagia, pharyngoesophageal phase: Secondary | ICD-10-CM | POA: Diagnosis not present

## 2023-05-03 DIAGNOSIS — E042 Nontoxic multinodular goiter: Secondary | ICD-10-CM | POA: Diagnosis not present

## 2023-05-03 NOTE — Telephone Encounter (Signed)
 Will give her 3 days worth of samples for Mucinex

## 2023-05-03 NOTE — Telephone Encounter (Signed)
 Pt is calling in because she was seen at [facility] and was told she should take Mucinex tablets to help with the mucus. Pt says ENT didn't have samples and she cannot afford the medication OTC and wanted to know if there were any samples in the office she could have and if not could she be sent in some medication for the mucus. Pt is requesting someone give her a call back.

## 2023-05-04 LAB — HM DIABETES EYE EXAM

## 2023-05-07 MED ORDER — OMEPRAZOLE 20 MG PO CPDR: 20.0000 mg | DELAYED_RELEASE_CAPSULE | Freq: Every day | ORAL | 1 refills | Status: DC

## 2023-05-07 NOTE — Telephone Encounter (Signed)
 Requested Prescriptions  Pending Prescriptions Disp Refills   omeprazole  (PRILOSEC) 20 MG capsule 90 capsule 1    Sig: Take 1 capsule (20 mg total) by mouth daily.     Gastroenterology: Proton Pump Inhibitors Passed - 05/07/2023  8:10 AM      Passed - Valid encounter within last 12 months    Recent Outpatient Visits           5 months ago Encounter for general adult medical examination with abnormal findings   [place] [place], Angeline ORN, NP   8 months ago Chest pain, unspecified type   [facility] [place], Angeline ORN, NP   9 months ago Vaginal bleeding   [place] [facility] Mecum, Rocky BRAVO, PA-C   12 months ago Diverticulitis of large intestine without bleeding, unspecified complication status   [place] [facility] Mecum, Rocky BRAVO, [place]   12 months ago Benign essential hypertension   [place] [facility] [place], Angeline ORN, NP       Future Appointments             Tomorrow Anton[place], Angeline ORN, NP  [facility]   In 1 month [place], Angeline ORN, NP [facility] [place]

## 2023-05-08 ENCOUNTER — Encounter: Payer: Self-pay | Admitting: Internal Medicine

## 2023-05-08 ENCOUNTER — Ambulatory Visit (INDEPENDENT_AMBULATORY_CARE_PROVIDER_SITE_OTHER): Payer: 59 | Admitting: Internal Medicine

## 2023-05-08 VITALS — BP 128/78 | Ht 66.0 in | Wt 197.4 lb

## 2023-05-08 DIAGNOSIS — Z6831 Body mass index (BMI) 31.0-31.9, adult: Secondary | ICD-10-CM

## 2023-05-08 DIAGNOSIS — I700 Atherosclerosis of aorta: Secondary | ICD-10-CM

## 2023-05-08 DIAGNOSIS — E785 Hyperlipidemia, unspecified: Secondary | ICD-10-CM | POA: Diagnosis not present

## 2023-05-08 DIAGNOSIS — G43719 Chronic migraine without aura, intractable, without status migrainosus: Secondary | ICD-10-CM

## 2023-05-08 DIAGNOSIS — D500 Iron deficiency anemia secondary to blood loss (chronic): Secondary | ICD-10-CM

## 2023-05-08 DIAGNOSIS — E782 Mixed hyperlipidemia: Secondary | ICD-10-CM

## 2023-05-08 DIAGNOSIS — K219 Gastro-esophageal reflux disease without esophagitis: Secondary | ICD-10-CM

## 2023-05-08 DIAGNOSIS — I10 Essential (primary) hypertension: Secondary | ICD-10-CM

## 2023-05-08 DIAGNOSIS — E1169 Type 2 diabetes mellitus with other specified complication: Secondary | ICD-10-CM | POA: Diagnosis not present

## 2023-05-08 DIAGNOSIS — E66811 Obesity, class 1: Secondary | ICD-10-CM

## 2023-05-08 DIAGNOSIS — M48062 Spinal stenosis, lumbar region with neurogenic claudication: Secondary | ICD-10-CM

## 2023-05-08 DIAGNOSIS — E6609 Other obesity due to excess calories: Secondary | ICD-10-CM

## 2023-05-08 LAB — POCT GLYCOSYLATED HEMOGLOBIN (HGB A1C): Hemoglobin A1C: 6.8 % — AB (ref 4.0–5.6)

## 2023-05-08 MED ORDER — FERROUS SULFATE 325 (65 FE) MG PO TABS: 325.0000 mg | ORAL_TABLET | Freq: Two times a day (BID) | ORAL | 1 refills | Status: DC

## 2023-05-08 NOTE — Assessment & Plan Note (Signed)
CBC and iron panel today Continue oral iron 

## 2023-05-08 NOTE — Assessment & Plan Note (Signed)
 Avoid foods that trigger reflux Encourage weight loss as this can help reduce reflux symptoms Continue omeprazole and famotidine Okay to continue Tums as needed

## 2023-05-08 NOTE — Assessment & Plan Note (Signed)
C-Met and lipid profile today Encouraged her to consume a low-fat diet Continue simvastatin 

## 2023-05-08 NOTE — Assessment & Plan Note (Signed)
Controlled on propranolol, lisinopril and HCTZ Reinforced DASH diet and exercise for weight loss C-Met today

## 2023-05-08 NOTE — Assessment & Plan Note (Signed)
 Continue Lidoderm, Tylenol, cyclobenzaprine and gabapentin She will continue to follow with neurosurgery

## 2023-05-08 NOTE — Patient Instructions (Signed)

## 2023-05-08 NOTE — Assessment & Plan Note (Signed)
C-Met and lipid profile today Encouraged her to consume a low-fat diet Continue simvastatin and aspirin 

## 2023-05-08 NOTE — Assessment & Plan Note (Addendum)
 Continue nortriptyline propranolol, Fioricet and Imitrex She will continue to follow with neurology

## 2023-05-08 NOTE — Assessment & Plan Note (Signed)
 Encourage diet and exercise for weight loss

## 2023-05-08 NOTE — Progress Notes (Signed)
 HPI  Patient presents to clinic today for follow-up of the conditions listed below.  GERD: Triggered by spicy foods, laying down after eating.  She does have breakthrough on omeprazole  and famotidine  for which she takes tums as needed with good relief.  Upper GI from 05/2022 reviewed.  DM2: Last A1c was 7.4%, 10/2022.  She is taking metformin  as prescribed.  She does not check her blood sugars.  She checks her feet routinely.  Her last eye exam last in 2024, [place].  Flu 04/2023.  Pneumovax 06/2014  Prevnar 11/2022.  COVID x 5.  HTN: Her BP today is 128/78.  She is taking propranolol , lisinopril  and hctz as prescribed.  ECG from 04/2021 reviewed.  Lumbar spinal stenosis: Chronic, managed with lidoderm , tylenol , cyclobenzaprine  and gabapentin .  She follows with pain management and neurosurgery.  HLD with aortic atherosclerosis: Her last LDL was 60, triglycerides 883, 11/2022 she denies myalgias on simvastatin . She is taking aspirin as well. She tries to consume low-fat diet.  Iron deficiency anemia: Her last H/H was 11.1/36, 11/2022.  She is not taking oral iron as prescribed.  She does not follow with hematology.  Migraines: These occur weekly. She is not sure what triggers this. She is taking nortriptyline, propranolol , fioricet and imitrex as prescribed. She follows with neurology.  Past Medical History:  Diagnosis Date   Anemia    Borderline diabetes mellitus    Diabetes mellitus without complication (HCC)    Elevated lipids    GERD (gastroesophageal reflux disease)    Glaucoma    Headache    h/o migraines   Hypertension    Neuropathy    Palpitations    a. 05/2020 Zio: Predominantly sinus rhythm, 80 (60-148).  1 SVT run-4 beats at 148.  Rare PACs and PVCs; b. 04/2021 Zio: Predominantly sinus rhythm, 80 (59-140).  Rare PACs/PVCs.   Panic attack     Current Outpatient Medications  Medication Sig Dispense Refill   aspirin 81 MG EC tablet Take 81 mg by mouth daily.     Blood Glucose  Monitoring Suppl (ACCU-CHEK GUIDE ME) w/Device KIT      brimonidine (ALPHAGAN) 0.15 % ophthalmic solution Place 1 drop into both eyes 3 (three) times daily.     clotrimazole -betamethasone  (LOTRISONE ) cream APPLY TOPICALLY TO AFFECTED AREAS 2 TIMES DAILY 45 g 0   dorzolamide-timolol  (COSOPT) 22.3-6.8 MG/ML ophthalmic solution Place 1 drop into both eyes at bedtime.     famotidine  (PEPCID ) 40 MG tablet Take 1 tablet (40 mg total) by mouth daily. 90 tablet 2   ferrous sulfate  325 (65 FE) MG tablet Take 1 tablet (325 mg total) by mouth 2 (two) times daily with a meal. 180 tablet 1   fexofenadine  (ALLEGRA  ALLERGY) 180 MG tablet Take 1 tablet (180 mg total) by mouth daily. 90 tablet 0   gabapentin  (NEURONTIN ) 800 MG tablet Take one tablet AM, one PM, two QHS 360 tablet 3   hydrochlorothiazide  (HYDRODIURIL ) 25 MG tablet TAKE 1 TABLET BY MOUTH DAILY 90 tablet 1   HYDROcodone -acetaminophen  (NORCO/VICODIN) 5-325 MG tablet Take by mouth.     ibuprofen  (ADVIL ) 600 MG tablet Take 1 tablet (600 mg total) by mouth every 8 (eight) hours as needed for moderate pain (pain score 4-6). 100 tablet 2   lidocaine  (LIDODERM ) 5 % Place 1 patch onto the skin daily. Remove & Discard patch within 12 hours or as directed by MD 90 patch 1   lisinopril  (ZESTRIL ) 2.5 MG tablet TAKE 1 TABLET BY MOUTH  DAILY FOR KIDNEY PROTECTION 90 tablet 1   Magnesium  Oxide 400 MG CAPS Take 1 capsule (400 mg total) by mouth daily at 12 noon. 90 capsule 0   metFORMIN  (GLUCOPHAGE ) 500 MG tablet TAKE 2 TABLETS BY MOUTH DAILY WITH BREAKFAST AND 1 TABLET IN THE EVENING 270 tablet 0   nortriptyline (PAMELOR) 10 MG capsule Take 10 mg by mouth 2 (two) times daily.     omeprazole  (PRILOSEC) 20 MG capsule Take 1 capsule (20 mg total) by mouth daily. 90 capsule 1   propranolol  (INDERAL ) 20 MG tablet Take 1 tablet (20 mg total) by mouth 2 (two) times daily. 180 tablet 0   ROCKLATAN 0.02-0.005 % SOLN Apply to eye.     simvastatin  (ZOCOR ) 20 MG tablet TAKE 1  TABLET BY MOUTH EVERY EVENING 90 tablet 2   No current facility-administered medications for this visit.    Allergies  Allergen Reactions   Sulfa Antibiotics Dermatitis   Darvon [Propoxyphene] Hives   Other Other (See Comments) and Rash    PAPER TAPE OK TO USE PAPER TAPE OK TO USE    Tape Rash    PAPER TAPE OK TO USE    Family History  Problem Relation Age of Onset   Breast cancer Sister 73   Heart attack Mother 55   Hyperlipidemia Mother    Hypertension Mother     Social History   Socioeconomic History   Marital status: Married    Spouse name: Not on file   Number of children: Not on file   Years of education: Not on file   Highest education level: Not on file  Occupational History   Not on file  Tobacco Use   Smoking status: Never   Smokeless tobacco: Never  Vaping Use   Vaping status: Never Used  Substance and Sexual Activity   Alcohol use: Not Currently    Comment: rare   Drug use: No   Sexual activity: Yes  Other Topics Concern   Not on file  Social History Narrative   Not on file   Social Drivers of Health   Financial Resource Strain: Medium Risk (03/16/2023)   Received from [facility]   Overall Financial Resource Strain (CARDIA)    Difficulty of Paying Living Expenses: Somewhat hard  Food Insecurity: Food Insecurity Present (03/16/2023)   [facility] from [facility]   Hunger Vital Sign    Worried About Running Out of Food in the Last Year: Sometimes true    Ran Out of Food in the Last Year: Sometimes true  Transportation Needs: No Transportation Needs (03/16/2023)   Received from [facility] - Transportation    In the past 12 months, has lack of transportation kept you from medical appointments or from getting medications?: No    Lack of Transportation (Non-Medical): No  Physical Activity: Insufficiently Active (09/29/2022)   Exercise Vital Sign    Days of Exercise per Week: 3 days     Minutes of Exercise per Session: 10 min  Stress: No Stress Concern Present (09/29/2022)   [employer] of Occupational Health - Occupational Stress Questionnaire    Feeling of Stress : Not at all  Social Connections: Moderately Integrated (09/29/2022)   Social Connection and Isolation Panel [NHANES]    Frequency of Communication with Friends and Family: More than three times a week    Frequency of Social Gatherings with Friends and Family: More than three times a week  Attends Religious Services: More than 4 times per year    Active Member of Clubs or Organizations: No    Attends Banker Meetings: Never    Marital Status: Married  Catering Manager Violence: Not At Risk (09/29/2022)   Humiliation, Afraid, Rape, and Kick questionnaire    Fear of Current or Ex-Partner: No    Emotionally Abused: No    Physically Abused: No    Sexually Abused: No    ROS:  Constitutional: Patient reports daily headaches.  Denies fever, malaise, fatigue, or abrupt weight changes.  HEENT: Denies eye pain, eye redness, ear pain, ringing in the ears, wax buildup, runny nose, nasal congestion, bloody nose, or sore throat. Respiratory: Denies difficulty breathing, shortness of breath, cough or sputum production.   Cardiovascular: Denies chest pain, chest tightness, palpitations or swelling in the hands or feet.  Gastrointestinal: Patient reports reflux.  Denies abdominal pain, bloating, constipation, diarrhea or blood in the stool.  GU: Denies frequency, urgency, pain with urination, blood in urine, odor or discharge. Musculoskeletal: Patient reports chronic low back pain.  Denies decrease in range of motion, difficulty with gait, muscle pain or joint swelling.  Skin: Denies redness, rashes, lesions or ulcercations.  Neurological: Patient reports neuropathic pain.  Denies dizziness, difficulty with memory, difficulty with speech or problems with balance and coordination.  Psych: Denies anxiety,  depression, SI/HI.  No other specific complaints in a complete review of systems (except as listed in HPI above).  PE: BP 128/78 (BP Location: Left Arm, Patient Position: Sitting, Cuff Size: Normal)   Ht 5' 6 (1.676 m)   Wt 197 lb 6.4 oz (89.5 kg)   BMI 31.86 kg/m    Wt Readings from Last 3 Encounters:  03/20/23 201 lb (91.2 kg)  12/07/22 201 lb (91.2 kg)  09/29/22 201 lb (91.2 kg)    General: Appears her stated age, obese, in NAD. HEENT: Head: normal shape and size; Eyes: sclera white, no icterus, conjunctiva pink, PERRLA and EOMs intact;  Cardiovascular: Normal rate and rhythm. S1,S2 noted.  No murmur, rubs or gallops noted. No JVD or BLE edema. No carotid bruits noted. Pulmonary/Chest: Normal effort and positive vesicular breath sounds. No respiratory distress. No wheezes, rales or ronchi noted.  Abdomen:  Normal bowel sounds. Musculoskeletal: No difficulty with gait.  Neurological: Alert and oriented. Cranial nerves II-XII grossly intact. Coordination normal.  Psychiatric: Mood and affect normal. Behavior is normal. Judgment and thought content normal.     BMET    Component Value Date/Time   NA 140 12/07/2022 0920   K 4.8 12/07/2022 0920   CL 101 12/07/2022 0920   CO2 31 12/07/2022 0920   GLUCOSE 136 (H) 12/07/2022 0920   BUN 11 12/07/2022 0920   CREATININE 0.75 12/07/2022 0920   CALCIUM 9.5 12/07/2022 0920   GFRNONAA >60 09/18/2018 1030   GFRAA >60 09/18/2018 1030    Lipid Panel     Component Value Date/Time   CHOL 132 12/07/2022 0920   TRIG 116 12/07/2022 0920   HDL 52 12/07/2022 0920   CHOLHDL 2.5 12/07/2022 0920   LDLCALC 60 12/07/2022 0920    CBC    Component Value Date/Time   WBC 8.6 12/07/2022 0920   RBC 5.02 12/07/2022 0920   HGB 11.1 (L) 12/07/2022 0920   HCT 36.0 12/07/2022 0920   PLT 285 12/07/2022 0920   MCV 71.7 (L) 12/07/2022 0920   MCH 22.1 (L) 12/07/2022 0920   MCHC 30.8 (L) 12/07/2022 0920   RDW  16.1 (H) 12/07/2022 0920    LYMPHSABS 2.4 06/05/2018 0608   MONOABS 0.5 06/05/2018 0608   EOSABS 0.1 06/05/2018 0608   BASOSABS 0.1 06/05/2018 0608    Hgb A1C Lab Results  Component Value Date   HGBA1C 7.4 (A) 12/07/2022     Assessment and Plan:    RTC in 7 months for your annual exam Angeline Laura, NP

## 2023-05-08 NOTE — Assessment & Plan Note (Signed)
 POCT A1c 6.8%  urine microalbumin today Encouraged her to consume a a low-carb diet and exercise for weight loss Continue metformin Encouraged routine exam Encouraged routine foot exam

## 2023-05-09 LAB — CBC
HCT: 35.7 % (ref 35.0–45.0)
Hemoglobin: 10.8 g/dL — ABNORMAL LOW (ref 11.7–15.5)
MCH: 21.9 pg — ABNORMAL LOW (ref 27.0–33.0)
MCHC: 30.3 g/dL — ABNORMAL LOW (ref 32.0–36.0)
MCV: 72.3 fL — ABNORMAL LOW (ref 80.0–100.0)
MPV: 11.7 fL (ref 7.5–12.5)
Platelets: 272 10*3/uL (ref 140–400)
RBC: 4.94 10*6/uL (ref 3.80–5.10)
RDW: 16.2 % — ABNORMAL HIGH (ref 11.0–15.0)
WBC: 7 10*3/uL (ref 3.8–10.8)

## 2023-05-09 LAB — LIPID PANEL
Cholesterol: 135 mg/dL (ref <200)
HDL: 51 mg/dL (ref >=50)
LDL Cholesterol (Calc): 65 mg/dL
Non-HDL Cholesterol (Calc): 84 mg/dL (ref <130)
Total CHOL/HDL Ratio: 2.6 (calc) (ref <5.0)
Triglycerides: 103 mg/dL (ref <150)

## 2023-05-09 LAB — COMPLETE METABOLIC PANEL WITH GFR
AG Ratio: 1.8 (calc) (ref 1.0–2.5)
ALT: 18 U/L (ref 6–29)
AST: 14 U/L (ref 10–35)
Albumin: 4.3 g/dL (ref 3.6–5.1)
Alkaline phosphatase (APISO): 97 U/L (ref 37–153)
BUN: 7 mg/dL (ref 7–25)
CO2: 30 mmol/L (ref 20–32)
Calcium: 9.5 mg/dL (ref 8.6–10.4)
Chloride: 106 mmol/L (ref 98–110)
Creat: 0.74 mg/dL (ref 0.60–1.00)
Globulin: 2.4 g/dL (ref 1.9–3.7)
Glucose, Bld: 142 mg/dL — ABNORMAL HIGH (ref 65–99)
Potassium: 3.7 mmol/L (ref 3.5–5.3)
Sodium: 142 mmol/L (ref 135–146)
Total Bilirubin: 0.3 mg/dL (ref 0.2–1.2)
Total Protein: 6.7 g/dL (ref 6.1–8.1)
eGFR: 87 mL/min/{1.73_m2} (ref >=60)

## 2023-05-09 LAB — MICROALBUMIN / CREATININE URINE RATIO
Creatinine, Urine: 147 mg/dL (ref 20–275)
Microalb Creat Ratio: 4 mg/g{creat} (ref <30)
Microalb, Ur: 0.6 mg/dL

## 2023-05-10 ENCOUNTER — Other Ambulatory Visit: Payer: Self-pay

## 2023-05-10 MED ORDER — LIDOCAINE 5 % EX PTCH: 1.0000 | MEDICATED_PATCH | CUTANEOUS | 1 refills | Status: DC

## 2023-05-10 NOTE — Telephone Encounter (Signed)
Pt given lab results per notes of Rene Kocher, NP on 05/10/23. Pt verbalized understanding. Pt will restart iron supplement today. She had ran out. Pt also asking for refill on Lidocaine patches d/t back pain and will be having back surgery in near future. Advised pt I would request refill and send to provider if protocol required. No further assistance noted.

## 2023-05-10 NOTE — Telephone Encounter (Signed)
Requested Prescriptions  Pending Prescriptions Disp Refills   lidocaine (LIDODERM) 5 % 90 patch 1    Sig: Place 1 patch onto the skin daily. Remove & Discard patch within 12 hours or as directed by MD     Analgesics:  Topicals Failed - 05/10/2023  3:08 PM      Failed - Manual Review: Labs are only required if the patient has taken medication for more than 8 weeks.      Failed - HGB in normal range and within 360 days    Hemoglobin  Date Value Ref Range Status  05/08/2023 10.8 (L) 11.7 - 15.5 g/dL Final         Passed - PLT in normal range and within 360 days    Platelets  Date Value Ref Range Status  05/08/2023 272 140 - 400 Thousand/uL Final         Passed - HCT in normal range and within 360 days    HCT  Date Value Ref Range Status  05/08/2023 35.7 35.0 - 45.0 % Final         Passed - Cr in normal range and within 360 days    Creat  Date Value Ref Range Status  05/08/2023 0.74 0.60 - 1.00 mg/dL Final   Creatinine, Urine  Date Value Ref Range Status  05/08/2023 147 20 - 275 mg/dL Final         Passed - eGFR is 30 or above and within 360 days    GFR calc Af Amer  Date Value Ref Range Status  09/18/2018 >60 >60 mL/min Final   GFR calc non Af Amer  Date Value Ref Range Status  09/18/2018 >60 >60 mL/min Final   eGFR  Date Value Ref Range Status  05/08/2023 87 > OR = 60 mL/min/1.41m2 Final         Passed - Patient is not pregnant      Passed - Valid encounter within last 12 months    Recent Outpatient Visits           2 days ago DM type 2 with diabetic mixed hyperlipidemia [facility])   [place] [facility] [place], Salvadore Oxford, NP   5 months ago Encounter for general adult medical examination with abnormal findings   [place] [facility] [place], Salvadore Oxford, NP   8 months ago Chest pain, unspecified type   [place] [facility] [place], Salvadore Oxford, NP   9 months ago Vaginal bleeding   [facility] [place], Oswaldo Conroy, PA-C   12 months ago Diverticulitis of large intestine without bleeding, unspecified complication status   [facility] [place], Oswaldo Conroy, PA-C       Future Appointments             In 1 month [place], Salvadore Oxford, NP [place] [facility], [facility]   In 7 months [place], Salvadore Oxford, NP [place] [facility], [facility]

## 2023-05-15 ENCOUNTER — Other Ambulatory Visit: Payer: 59

## 2023-05-17 ENCOUNTER — Ambulatory Visit: Payer: Self-pay | Admitting: *Deleted

## 2023-05-17 NOTE — Telephone Encounter (Signed)
Mailbox is full, unable to reach patient. Would like to schedule for a virtual visit to discuss further.

## 2023-05-17 NOTE — Telephone Encounter (Signed)
  Chief Complaint: Muscle cramps in left thigh, right leg and toes from time to time Symptoms: painful cramps Frequency: Off and on Pertinent Negatives: Patient denies Cramps no Disposition: [] ED /[] Urgent Care (no appt availability in office) / [] Appointment(In office/virtual)/ []  [place] [facility]/ [] Home Care/ [x] Refused Recommended Disposition /[] [place] [facility]/ []  Follow-up with PCP Additional Notes: Pt would like a muscle relaxer called in for her muscle cramps in her left thigh, rt leg and toes. Pt does not want an appt with provider. Pt is on a statin.  Please advise.Pt would like a call back regarding whether this will be called in or not.  Summary: muscle cramps     Patients states that she is having muscle cramps in her upper left leg. Patient is requesting a muscle relaxer.     Reason for Disposition  [1] SEVERE pain AND [2] taking a statin medicine (a lipid or cholesterol lowering drug)  Answer Assessment - Initial Assessment Questions 1. ONSET: "When did the muscle aches or body pains start?"      Since last year 2. LOCATION: "What part of your body is hurting?" (e.g., entire body, arms, legs)      Upper left leg 3. SEVERITY: "How bad is the pain?" (Scale 1-10; or mild, moderate, severe)   - MILD (1-3): doesn't interfere with normal activities    - MODERATE (4-7): interferes with normal activities or awakens from sleep    - SEVERE (8-10):  excruciating pain, unable to do any normal activities      Last night was 7/10 4. CAUSE: "What do you think is causing the pains?"     no 5. FEVER: "Have you been having fever?"     no 6. OTHER SYMPTOMS: "Do you have any other symptoms?" (e.g., chest pain, weakness, rash, cold or flu symptoms, weight loss)     no  Protocols used: Muscle Aches and Body Pain-A-AH

## 2023-05-17 NOTE — Telephone Encounter (Signed)
Summary: muscle cramps   Patients states that she is having muscle cramps in her upper left leg. Patient is requesting a muscle relaxer.         Attempted to contact patient- no answer- mailbox is full- unable to leave call back message. (No answer home number)

## 2023-05-17 NOTE — Telephone Encounter (Signed)
Summary: muscle cramps   Patients states that she is having muscle cramps in her upper left leg. Patient is requesting a muscle relaxer.     Called pt - unable to leave a message  - mailbox is full.

## 2023-05-18 DIAGNOSIS — R1032 Left lower quadrant pain: Secondary | ICD-10-CM | POA: Diagnosis not present

## 2023-05-21 ENCOUNTER — Telehealth (INDEPENDENT_AMBULATORY_CARE_PROVIDER_SITE_OTHER): Payer: 59 | Admitting: Internal Medicine

## 2023-05-21 ENCOUNTER — Encounter: Payer: Self-pay | Admitting: Internal Medicine

## 2023-05-21 DIAGNOSIS — R059 Cough, unspecified: Secondary | ICD-10-CM

## 2023-05-21 DIAGNOSIS — J069 Acute upper respiratory infection, unspecified: Secondary | ICD-10-CM | POA: Diagnosis not present

## 2023-05-21 DIAGNOSIS — M62838 Other muscle spasm: Secondary | ICD-10-CM

## 2023-05-21 MED ORDER — BENZONATATE 100 MG PO CAPS: 100.0000 mg | ORAL_CAPSULE | Freq: Three times a day (TID) | ORAL | 0 refills | Status: DC | PRN

## 2023-05-21 MED ORDER — FLUTICASONE PROPIONATE 50 MCG/ACT NA SUSP: 2.0000 | Freq: Every day | NASAL | 3 refills | Status: DC

## 2023-05-21 NOTE — Patient Instructions (Signed)
Muscle Cramps and Spasms Muscle cramps and spasms occur when a muscle or muscles tighten and you have no control over this tightening (involuntary muscle contraction). They are a common problem that can happen in any muscle. The most common place is in the calf muscles of the leg. There are a few ways that muscle cramps and spasms differ: Muscle cramps are painful. They come and go and may last for a few seconds or up to 15 minutes. Muscle cramps are often more forceful and last longer than muscle spasms. Muscle spasms may or may not be painful. They may last just a few seconds or last much longer. Certain conditions, such as diabetes or Parkinson's disease, can make you more likely to have cramps or spasms. But in most cases, cramps and spasms are not caused by other conditions. Common causes include: Overexertion. This is when you do more physical work or exercise than your body is ready for. Overuse from doing the same movements too many times. Staying in one position for too long. Improper preparation, form, or technique when playing a sport or doing an activity. Not enough water or other fluids in your body (dehydration). Other causes may include: Injury. Side effects of some medicines. Too few salts and minerals in your body (electrolytes), such as potassium and calcium. This could happen if you are taking water pills (diuretics) or if you are pregnant. In many cases, the cause of muscle cramps or spasms is not known. Follow these instructions at home: Eating and drinking Drink enough fluid to keep your pee (urine) pale yellow. This can help prevent cramps or spasms. Eat a healthy diet that includes a lot of nutrients to help your muscles work. A healthy diet includes fruits and vegetables, lean protein, whole grains, and low-fat or nonfat dairy products. Managing pain and stiffness     Try to massage, stretch, and relax the affected muscle. Do this for a few minutes at a time. If told,  put ice on the muscles. This may help if you are sore or have pain after a cramp or spasm. Put ice in a plastic bag. Place a towel between your skin and the bag. Leave the ice on for 20 minutes, 2-3 times a day. If told, apply heat to tight or tense muscles as often as told by your health care provider. Use the heat source that your provider recommends, such as a moist heat pack or a heating pad. Place a towel between your skin and the heat source. Leave the heat on for 20-30 minutes. If your skin turns bright red, remove the ice or heat right away to prevent skin damage. The risk of damage is higher if you cannot feel pain, heat, or cold. Take hot showers or baths to help relax tight muscles. General instructions If you are having cramps often, avoid intense exercise for a few days. Take over-the-counter and prescription medicines only as told by your provider. Watch for any changes in your symptoms. Contact a health care provider if: Your cramps or spasms get more severe or happen more often. Your cramps or spasms do not get better over time. This information is not intended to replace advice given to you by your health care provider. Make sure you discuss any questions you have with your health care provider. Document Revised: 11/29/2021 Document Reviewed: 11/29/2021 Elsevier Patient Education  2024 [employer].

## 2023-05-21 NOTE — Progress Notes (Signed)
Virtual Visit via Video Note  I connected with Stacy Cunningham on 05/21/23 at 11:20 AM EST by a video enabled telemedicine application and verified that I am speaking with the correct person using two identifiers.  Location: Patient: Home Provider: Office  Person's participating in this video call: [name], NP-C and [name]   I discussed the limitations of evaluation and management by telemedicine and the availability of in person appointments. The patient expressed understanding and agreed to proceed.  History of Present Illness:   Discussed the use of AI scribe software for clinical note transcription with the patient, who gave verbal consent to proceed.  History of Present Illness   The patient presents with a chief complaint of severe sinus problems, which have been ongoing for a couple of days. She reports difficulty breathing, particularly when lying down, and has been unable to afford over-the-counter remedies. Accompanying symptoms include nasal congestion, ear pain, runny nose, sore throat, and coughing. The patient denies any productive cough, fever, chills, body aches, nausea, vomiting, or diarrhea.  The patient has been using a nasal spray (atrovent) and a 24-hour allegra, but reports minimal relief from these treatments. She expresses a desire for a different nasal spray and cough tablets.  In addition to the sinus issues, the patient also reports spasms under the rib cage and in the back. Despite regular hydration, these spasms persist and cause significant discomfort. The patient denies taking any over-the-counter magnesium supplements.      Past Medical History:  Diagnosis Date   Anemia    Borderline diabetes mellitus    Diabetes mellitus without complication (HCC)    Elevated lipids    GERD (gastroesophageal reflux disease)    Glaucoma    Headache    h/o migraines   Hypertension    Neuropathy    Palpitations    a. 05/2020 [facility]: Predominantly sinus rhythm,  80 (60-148).  1 SVT run-4 beats at 148.  Rare PACs and PVCs; b. 04/2021 [facility]: Predominantly sinus rhythm, 80 (59-140).  Rare PACs/PVCs.   Panic attack     Current Outpatient Medications  Medication Sig Dispense Refill   aspirin 81 MG EC tablet Take 81 mg by mouth daily.     Blood Glucose Monitoring Suppl (ACCU-CHEK GUIDE ME) w/Device KIT      brimonidine (ALPHAGAN) 0.15 % ophthalmic solution Place 1 drop into both eyes 3 (three) times daily.     butalbital-acetaminophen-caffeine (FIORICET) 50-325-40 MG tablet Take 1 tablet by mouth every 4 (four) hours as needed.     clotrimazole-betamethasone (LOTRISONE) cream APPLY TOPICALLY TO AFFECTED AREAS 2 TIMES DAILY 45 g 0   dorzolamide-timolol (COSOPT) 22.3-6.8 MG/ML ophthalmic solution Place 1 drop into both eyes at bedtime.     famotidine (PEPCID) 40 MG tablet Take 1 tablet (40 mg total) by mouth daily. 90 tablet 2   ferrous sulfate 325 (65 FE) MG tablet Take 1 tablet (325 mg total) by mouth 2 (two) times daily with a meal. 180 tablet 1   fexofenadine (ALLEGRA ALLERGY) 180 MG tablet Take 1 tablet (180 mg total) by mouth daily. 90 tablet 0   gabapentin (NEURONTIN) 800 MG tablet Take one tablet AM, one PM, two QHS 360 tablet 3   hydrochlorothiazide (HYDRODIURIL) 25 MG tablet TAKE 1 TABLET BY MOUTH DAILY 90 tablet 1   ibuprofen (ADVIL) 600 MG tablet Take 1 tablet (600 mg total) by mouth every 8 (eight) hours as needed for moderate pain (pain score 4-6). 100 tablet 2  lidocaine (LIDODERM) 5 % Place 1 patch onto the skin daily. Remove & Discard patch within 12 hours or as directed by MD 90 patch 1   lisinopril (ZESTRIL) 2.5 MG tablet TAKE 1 TABLET BY MOUTH DAILY FOR KIDNEY PROTECTION 90 tablet 1   Magnesium Oxide 400 MG CAPS Take 1 capsule (400 mg total) by mouth daily at 12 noon. 90 capsule 0   metFORMIN (GLUCOPHAGE) 500 MG tablet TAKE 2 TABLETS BY MOUTH DAILY WITH BREAKFAST AND 1 TABLET IN THE EVENING 270 tablet 0   nortriptyline (PAMELOR) 10 MG capsule  Take 10 mg by mouth 2 (two) times daily.     omeprazole (PRILOSEC) 20 MG capsule Take 1 capsule (20 mg total) by mouth daily. 90 capsule 1   propranolol (INDERAL) 20 MG tablet Take 1 tablet (20 mg total) by mouth 2 (two) times daily. 180 tablet 0   ROCKLATAN 0.02-0.005 % SOLN Apply to eye.     simvastatin (ZOCOR) 20 MG tablet TAKE 1 TABLET BY MOUTH EVERY EVENING 90 tablet 2   No current facility-administered medications for this visit.    Allergies  Allergen Reactions   Sulfa Antibiotics Dermatitis   Darvon [Propoxyphene] Hives   Other Other (See Comments) and Rash    PAPER TAPE OK TO USE PAPER TAPE OK TO USE    Tape Rash    PAPER TAPE OK TO USE    Family History  Problem Relation Age of Onset   Breast cancer Sister 62   Heart attack Mother 80   Hyperlipidemia Mother    Hypertension Mother     Social History   Socioeconomic History   Marital status: Married    Spouse name: Not on file   Number of children: Not on file   Years of education: Not on file   Highest education level: Not on file  Occupational History   Not on file  Tobacco Use   Smoking status: Never   Smokeless tobacco: Never  Vaping Use   Vaping status: Never Used  Substance and Sexual Activity   Alcohol use: Not Currently    Comment: rare   Drug use: No   Sexual activity: Yes  Other Topics Concern   Not on file  Social History Narrative   Not on file   Social Drivers of Health   Financial Resource Strain: Medium Risk (03/16/2023)   Received from [facility]   Overall Financial Resource Strain (CARDIA)    Difficulty of Paying Living Expenses: Somewhat hard  Food Insecurity: Food Insecurity Present (03/16/2023)   [facility]   Hunger Vital Sign    Worried About Running Out of Food in the Last Year: Sometimes true    Ran Out of Food in the Last Year: Sometimes true  Transportation Needs: No Transportation Needs (03/16/2023)   Received from  [facility] - Transportation    In the past 12 months, has lack of transportation kept you from medical appointments or from getting medications?: No    Lack of Transportation (Non-Medical): No  Physical Activity: Insufficiently Active (09/29/2022)   Exercise Vital Sign    Days of Exercise per Week: 3 days    Minutes of Exercise per Session: 10 min  Stress: No Stress Concern Present (09/29/2022)   [employer] of Occupational Health - Occupational Stress Questionnaire    Feeling of Stress : Not at all  Social Connections: Moderately Integrated (09/29/2022)   Social Connection and  Isolation Panel [NHANES]    Frequency of Communication with Friends and Family: More than three times a week    Frequency of Social Gatherings with Friends and Family: More than three times a week    Attends Religious Services: More than 4 times per year    Active Member of [employer] or Organizations: No    Attends Banker Meetings: Never    Marital Status: Married  Catering manager Violence: Not At Risk (09/29/2022)   Humiliation, Afraid, Rape, and Kick questionnaire    Fear of Current or Ex-Partner: No    Emotionally Abused: No    Physically Abused: No    Sexually Abused: No     Constitutional: Patient reports intermittent headaches.  Denies fever, malaise, fatigue, or abrupt weight changes.  HEENT: Pt reports sinus pressure, nasal congestion, ear pain, runny nose and sore throat. Denies eye pain, eye redness, ringing in the ears, wax buildup, bloody nose. Respiratory: Pt reports cough and mild shortness of breath. Denies difficulty breathing.   Cardiovascular: Denies chest pain, chest tightness, palpitations or swelling in the hands or feet.  Gastrointestinal: Denies abdominal pain, bloating, constipation, diarrhea or blood in the stool.  Musculoskeletal: Patient reports intermittent back pain, muscle cramps.  Denies decrease in range of motion, difficulty with gait, or  joint swelling.  Skin: Denies redness, rashes, lesions or ulcercations.  Neurological: Denies dizziness, difficulty with memory, difficulty with speech or problems with balance and coordination.   No other specific complaints in a complete review of systems (except as listed in HPI above).  Observations/Objective:   Wt Readings from Last 3 Encounters:  05/08/23 197 lb 6.4 oz (89.5 kg)  03/20/23 201 lb (91.2 kg)  12/07/22 201 lb (91.2 kg)    General: Appears her stated age, well developed, well nourished in NAD. HEENT: Nose: congestion noted; Throat: hoarseness noted. Pulmonary/Chest: Normal effort. No respiratory distress.  Neurological: Alert and oriented. Coordination normal.    BMET    Component Value Date/Time   NA 142 05/08/2023 1123   K 3.7 05/08/2023 1123   CL 106 05/08/2023 1123   CO2 30 05/08/2023 1123   GLUCOSE 142 (H) 05/08/2023 1123   BUN 7 05/08/2023 1123   CREATININE 0.74 05/08/2023 1123   CALCIUM 9.5 05/08/2023 1123   GFRNONAA >60 09/18/2018 1030   GFRAA >60 09/18/2018 1030    Lipid Panel     Component Value Date/Time   CHOL 135 05/08/2023 1123   TRIG 103 05/08/2023 1123   HDL 51 05/08/2023 1123   CHOLHDL 2.6 05/08/2023 1123   LDLCALC 65 05/08/2023 1123    CBC    Component Value Date/Time   WBC 7.0 05/08/2023 1123   RBC 4.94 05/08/2023 1123   HGB 10.8 (L) 05/08/2023 1123   HCT 35.7 05/08/2023 1123   PLT 272 05/08/2023 1123   MCV 72.3 (L) 05/08/2023 1123   MCH 21.9 (L) 05/08/2023 1123   MCHC 30.3 (L) 05/08/2023 1123   RDW 16.2 (H) 05/08/2023 1123   LYMPHSABS 2.4 06/05/2018 0608   MONOABS 0.5 06/05/2018 0608   EOSABS 0.1 06/05/2018 0608   BASOSABS 0.1 06/05/2018 0608    Hgb A1C Lab Results  Component Value Date   HGBA1C 6.8 (A) 05/08/2023       Assessment and Plan: Assessment and Plan    Viral Upper Respiratory Infection With Cough Nasal congestion, cough, and sore throat for a few days. No fever, chills, body aches, or  productive cough. Likely viral in nature. -Continue  Allegra 24 hour as available. -Prescribe Fluticasone nasal spray, use twice daily. -Prescribe Benzonate 100mg , take three times daily.  Muscle Spasms Complaints of spasms under the rib cage and in the back. Possible dehydration or electrolyte imbalance. -Recommend over-the-counter Magnesium 400mg  daily. -Consider further evaluation if symptoms persist.  Follow-up Re-evaluate if symptoms do not improve by the end of the week.        RTC in 7 months for annual exam  Follow Up Instructions:    I discussed the assessment and treatment plan with the patient. The patient was provided an opportunity to ask questions and all were answered. The patient agreed with the plan and demonstrated an understanding of the instructions.   The patient was advised to call back or seek an in-person evaluation if the symptoms worsen or if the condition fails to improve as anticipated.   [name], NP

## 2023-05-22 ENCOUNTER — Ambulatory Visit
Admission: RE | Admit: 2023-05-22 | Discharge: 2023-05-22 | Disposition: A | Discharge status: Home / Self Care | Payer: 59 | Source: Ambulatory Visit | Attending: Otolaryngology | Admitting: Otolaryngology

## 2023-05-22 DIAGNOSIS — E041 Nontoxic single thyroid nodule: Secondary | ICD-10-CM

## 2023-05-22 DIAGNOSIS — E042 Nontoxic multinodular goiter: Secondary | ICD-10-CM | POA: Diagnosis not present

## 2023-05-23 ENCOUNTER — Ambulatory Visit: Payer: Self-pay | Admitting: *Deleted

## 2023-05-23 NOTE — Telephone Encounter (Signed)
  Chief Complaint: sore arm with COVID vaccine Symptoms: arm sore- no redness, knot , small amount of swelling Frequency: vaccine yesterday Pertinent Negatives: Patient denies fever, other symptoms Disposition: [] ED /[] Urgent Care (no appt availability in office) / [] Appointment(In office/virtual)/ []  [facility]/ [x] Home Care/ [] Refused Recommended Disposition /[] [facility]/ []  Follow-up with PCP Additional Notes: Patient advised common vaccine reactions- home care and advice on when to call office for complication

## 2023-05-23 NOTE — Telephone Encounter (Signed)
COVID Reason for Disposition . COVID-19 vaccine, injection site reaction (e.g., pain, redness, swelling), questions about  Answer Assessment - Initial Assessment Questions 1. MAIN CONCERN OR SYMPTOM:  "What is your main concern right now?" "What question do you have?" "What's the main symptom you're worried about?" (e.g., fever, pain, redness, swelling)     Arm sore after COVID injection 2. VACCINE: "What vaccination did you receive?" (e.g., none; AstraZeneca, J&J, Moderna, Pfizer, other)      unsure 3. SYMPTOM ONSET: "When did the soreness begin?" (e.g., not relevant; hours, days)      Yesterday evening 4. SYMPTOM SEVERITY: "How bad is it?"      sore 5. FEVER: "Is there a fever?" If Yes, ask: "What is it, how was it measured, and when did it start?"      no 6. PAST REACTIONS: "Have you reacted to immunizations before?" If Yes, ask: "What happened?"     Yes- sore arm, swelling 7. OTHER SYMPTOMS: "Do you have any other symptoms?" (e.g., fatigue, headache, joint or muscle pain)     No other symptoms- patient is treating sinus symptoms  Protocols used: Coronavirus (COVID-19) Vaccine Questions and Reactions-A-AH

## 2023-05-30 DIAGNOSIS — L600 Ingrowing nail: Secondary | ICD-10-CM | POA: Diagnosis not present

## 2023-05-30 DIAGNOSIS — B351 Tinea unguium: Secondary | ICD-10-CM | POA: Diagnosis not present

## 2023-05-30 DIAGNOSIS — E1140 Type 2 diabetes mellitus with diabetic neuropathy, unspecified: Secondary | ICD-10-CM | POA: Diagnosis not present

## 2023-06-01 DIAGNOSIS — H401123 Primary open-angle glaucoma, left eye, severe stage: Secondary | ICD-10-CM | POA: Diagnosis not present

## 2023-06-04 ENCOUNTER — Ambulatory Visit: Payer: Self-pay

## 2023-06-04 NOTE — Telephone Encounter (Signed)
 Summary: bad appetite   Pt called in says not able to eat well, doesn't have a appetite. She didn't want an appt, , just wanted to know what vitamin she can take.      Called pt - unable to leave a message voice mail is full.

## 2023-06-04 NOTE — Telephone Encounter (Signed)
 Summary: bad appetite   Pt called in says not able to eat well, doesn't have a appetite. She didn't want an appt, , just wanted to know what vitamin she can take.     Called pt - unable to leave a message- mailbox is full.

## 2023-06-04 NOTE — Telephone Encounter (Signed)
 Summary: bad appetite   Pt called in says not able to eat well, doesn't have a appetite. She didn't want an appt, , just wanted to know what vitamin she can take.     Called pt unable to [facility] - mail box is full. 3 tries without reaching pt. Per protocol I will forward encounter to clinic for follow up

## 2023-06-05 ENCOUNTER — Other Ambulatory Visit: Payer: Self-pay | Admitting: Internal Medicine

## 2023-06-05 NOTE — Telephone Encounter (Signed)
Scheduled for 2/13 at 11:40 with [facility].

## 2023-06-05 NOTE — Telephone Encounter (Signed)
She will need an appt to discuss these types of medications. Virtual appt is fine.

## 2023-06-05 NOTE — Telephone Encounter (Signed)
There are prescription medications that we can give her to improve her appetite however I am not aware of any herbal or mineral supplements that she can take to increase her appetite.  Maybe she can try a boost protein shakes 2 times a day in addition to her meals.

## 2023-06-06 DIAGNOSIS — E042 Nontoxic multinodular goiter: Secondary | ICD-10-CM | POA: Diagnosis not present

## 2023-06-06 NOTE — Telephone Encounter (Signed)
Requested Prescriptions  Pending Prescriptions Disp Refills   metFORMIN (GLUCOPHAGE) 500 MG tablet [Pharmacy Med Name: METFORMIN HCL 500 MG TAB] 270 tablet 0    Sig: TAKE 2 TABLETS BY MOUTH DAILY WITH BREAKFAST AND 1 TABLET IN THE EVENING     Endocrinology:  Diabetes - Biguanides Failed - 06/06/2023 11:08 AM      Failed - B12 Level in normal range and within 720 days    No results found for: "VITAMINB12"       Failed - CBC within normal limits and completed in the last 12 months    WBC  Date Value Ref Range Status  05/08/2023 7.0 3.8 - 10.8 Thousand/uL Final   RBC  Date Value Ref Range Status  05/08/2023 4.94 3.80 - 5.10 Million/uL Final   Hemoglobin  Date Value Ref Range Status  05/08/2023 10.8 (L) 11.7 - 15.5 g/dL Final   HCT  Date Value Ref Range Status  05/08/2023 35.7 35.0 - 45.0 % Final   MCHC  Date Value Ref Range Status  05/08/2023 30.3 (L) 32.0 - 36.0 g/dL Final    Comment:    For adults, a slight decrease in the calculated MCHC value (in the range of 30 to 32 g/dL) is most likely not clinically significant; however, it should be interpreted with caution in correlation with other red cell parameters and the patient's clinical condition.    [facility]  Date Value Ref Range Status  05/08/2023 21.9 (L) 27.0 - 33.0 pg Final   MCV  Date Value Ref Range Status  05/08/2023 72.3 (L) 80.0 - 100.0 fL Final   No results found for: "PLTCOUNTKUC", "LABPLAT", "POCPLA" RDW  Date Value Ref Range Status  05/08/2023 16.2 (H) 11.0 - 15.0 % Final         Passed - Cr in normal range and within 360 days    Creat  Date Value Ref Range Status  05/08/2023 0.74 0.60 - 1.00 mg/dL Final   Creatinine, Urine  Date Value Ref Range Status  05/08/2023 147 20 - 275 mg/dL Final         Passed - HBA1C is between 0 and 7.9 and within 180 days    Hemoglobin A1C  Date Value Ref Range Status  05/08/2023 6.8 (A) 4.0 - 5.6 % Final   HbA1c, POC (controlled diabetic range)  Date Value Ref  Range Status  12/07/2022 7.4 (A) 0.0 - 7.0 % Final         Passed - eGFR in normal range and within 360 days    GFR calc Af Amer  Date Value Ref Range Status  09/18/2018 >60 >60 mL/min Final   GFR calc non Af Amer  Date Value Ref Range Status  09/18/2018 >60 >60 mL/min Final   eGFR  Date Value Ref Range Status  05/08/2023 87 > OR = 60 mL/min/1.59m2 Final         Passed - Valid encounter within last 6 months    Recent Outpatient Visits           2 weeks ago Viral URI with cough   [place] [facility] [place], Salvadore Oxford, NP   4 weeks ago DM type 2 with diabetic mixed hyperlipidemia [facility])   [place] [facility] [place], Salvadore Oxford, NP   6 months ago Encounter for general adult medical examination with abnormal findings   [place] [facility] [place], Salvadore Oxford, NP   9 months ago Chest pain,  unspecified type   [place] [facility] [place], Salvadore Oxford, NP   10 months ago Vaginal bleeding   [facility] [place], Oswaldo [place], [place]       Future Appointments             Tomorrow [place], Salvadore Oxford, NP [place] [facility]   In 1 week [place], Salvadore Oxford, NP [place] [facility], [place]   In 6 months [place], Salvadore Oxford, NP [facility], [place]

## 2023-06-07 ENCOUNTER — Telehealth (INDEPENDENT_AMBULATORY_CARE_PROVIDER_SITE_OTHER): Payer: 59 | Admitting: Internal Medicine

## 2023-06-07 ENCOUNTER — Encounter: Payer: Self-pay | Admitting: Internal Medicine

## 2023-06-07 ENCOUNTER — Telehealth: Payer: Self-pay | Admitting: Internal Medicine

## 2023-06-07 DIAGNOSIS — R634 Abnormal weight loss: Secondary | ICD-10-CM | POA: Diagnosis not present

## 2023-06-07 DIAGNOSIS — K219 Gastro-esophageal reflux disease without esophagitis: Secondary | ICD-10-CM

## 2023-06-07 DIAGNOSIS — R630 Anorexia: Secondary | ICD-10-CM | POA: Diagnosis not present

## 2023-06-07 NOTE — Progress Notes (Signed)
Virtual Visit via Video Note  I connected with Stacy Cunningham on 06/07/23 at 11:40 AM EST by a video enabled telemedicine application and verified that I am speaking with the correct person using two identifiers.  Location: Patient: Home Provider: Office  Person's participating in this video call: Stacy Reaper, NP-C and Stacy Cunningham   I discussed the limitations of evaluation and management by telemedicine and the availability of in person appointments. The patient expressed understanding and agreed to proceed.  History of Present Illness:   Discussed the use of AI scribe software for clinical note transcription with the patient, who gave verbal consent to proceed.  Stacy Cunningham is a 73 year old female who presents with poor appetite and mild weight loss.  She has experienced poor appetite for the past week, describing difficulty in eating as she normally would. Despite being an 'eater,' she finds herself unable to consume much food, stating, 'I start eating, but I ain't eating much.' No recent changes in her diet or new medications that could suppress her appetite. Alongside the poor appetite, she notes a mild weight loss of about two to three pounds over the past week. She does not express concern over the weight loss itself, as she feels she could benefit from losing some weight. No nausea, vomiting, abdominal pain, or constipation are reported.  She reports ongoing diarrhea, which is not a new symptom and has been present alongside the poor appetite. No new symptoms aside from the diarrhea.  Her current medications include metformin for diabetes and omeprazole (Prilosec) for heartburn, which she takes daily. She denies taking any injectable diabetic medications. In the review of symptoms, no heartburn, reflux, nausea, vomiting, abdominal pain, anxiety, or depression. She has not been checked for H. pylori in the past.      Past Medical History:  Diagnosis Date   Anemia    Borderline  diabetes mellitus    Diabetes mellitus without complication (HCC)    Elevated lipids    GERD (gastroesophageal reflux disease)    Glaucoma    Headache    h/o migraines   Hypertension    Neuropathy    Palpitations    a. 05/2020 [facility]: Predominantly sinus rhythm, 80 (60-148).  1 SVT run-4 beats at 148.  Rare PACs and PVCs; b. 04/2021 [facility]: Predominantly sinus rhythm, 80 (59-140).  Rare PACs/PVCs.   Panic attack     Current Outpatient Medications  Medication Sig Dispense Refill   aspirin 81 MG EC tablet Take 81 mg by mouth daily.     benzonatate (TESSALON) 100 MG capsule Take 1 capsule (100 mg total) by mouth 3 (three) times daily as needed for cough. 30 capsule 0   Blood Glucose Monitoring Suppl (ACCU-CHEK GUIDE ME) w/Device KIT      brimonidine (ALPHAGAN) 0.15 % ophthalmic solution Place 1 drop into both eyes 3 (three) times daily.     butalbital-acetaminophen-caffeine (FIORICET) 50-325-40 MG tablet Take 1 tablet by mouth every 4 (four) hours as needed.     clotrimazole-betamethasone (LOTRISONE) cream APPLY TOPICALLY TO AFFECTED AREAS 2 TIMES DAILY 45 g 0   dorzolamide-timolol (COSOPT) 22.3-6.8 MG/ML ophthalmic solution Place 1 drop into both eyes at bedtime.     famotidine (PEPCID) 40 MG tablet Take 1 tablet (40 mg total) by mouth daily. 90 tablet 2   ferrous sulfate 325 (65 FE) MG tablet Take 1 tablet (325 mg total) by mouth 2 (two) times daily with a meal. 180 tablet 1   fexofenadine (ALLEGRA ALLERGY)  180 MG tablet Take 1 tablet (180 mg total) by mouth daily. 90 tablet 0   fluticasone (FLONASE) 50 MCG/ACT nasal spray Place 2 sprays into both nostrils daily. Use for 4-6 weeks then stop and use seasonally or as needed. 16 g 3   gabapentin (NEURONTIN) 800 MG tablet Take one tablet AM, one PM, two QHS 360 tablet 3   hydrochlorothiazide (HYDRODIURIL) 25 MG tablet TAKE 1 TABLET BY MOUTH DAILY 90 tablet 1   ibuprofen (ADVIL) 600 MG tablet Take 1 tablet (600 mg total) by mouth every 8 (eight) hours  as needed for moderate pain (pain score 4-6). 100 tablet 2   lidocaine (LIDODERM) 5 % Place 1 patch onto the skin daily. Remove & Discard patch within 12 hours or as directed by MD 90 patch 1   lisinopril (ZESTRIL) 2.5 MG tablet TAKE 1 TABLET BY MOUTH DAILY FOR KIDNEY PROTECTION 90 tablet 1   Magnesium Oxide 400 MG CAPS Take 1 capsule (400 mg total) by mouth daily at 12 noon. 90 capsule 0   metFORMIN (GLUCOPHAGE) 500 MG tablet TAKE 2 TABLETS BY MOUTH DAILY WITH BREAKFAST AND 1 TABLET IN THE EVENING 270 tablet 0   nortriptyline (PAMELOR) 10 MG capsule Take 10 mg by mouth 2 (two) times daily.     omeprazole (PRILOSEC) 20 MG capsule Take 1 capsule (20 mg total) by mouth daily. 90 capsule 1   propranolol (INDERAL) 20 MG tablet Take 1 tablet (20 mg total) by mouth 2 (two) times daily. 180 tablet 0   ROCKLATAN 0.02-0.005 % SOLN Apply to eye.     simvastatin (ZOCOR) 20 MG tablet TAKE 1 TABLET BY MOUTH EVERY EVENING 90 tablet 2   No current facility-administered medications for this visit.    Allergies  Allergen Reactions   Sulfa Antibiotics Dermatitis   Darvon [Propoxyphene] Hives   Other Other (See Comments) and Rash    PAPER TAPE OK TO USE PAPER TAPE OK TO USE    Tape Rash    PAPER TAPE OK TO USE    Family History  Problem Relation Age of Onset   Breast cancer Sister 71   Heart attack Mother 35   Hyperlipidemia Mother    Hypertension Mother     Social History   Socioeconomic History   Marital status: Married    Spouse name: Not on file   Number of children: Not on file   Years of education: Not on file   Highest education level: Not on file  Occupational History   Not on file  Tobacco Use   Smoking status: Never   Smokeless tobacco: Never  Vaping Use   Vaping status: Never Used  Substance and Sexual Activity   Alcohol use: Not Currently    Comment: rare   Drug use: No   Sexual activity: Yes  Other Topics Concern   Not on file  Social History Narrative   Not on file    Social Drivers of Health   Financial Resource Strain: High Risk (05/30/2023)   Received from [facility]   Overall Financial Resource Strain (CARDIA)    Difficulty of Paying Living Expenses: Hard  Food Insecurity: Food Insecurity Present (05/30/2023)   Received from [facility]   Hunger Vital Sign    Worried About Running Out of Food in the Last Year: Sometimes true    Ran Out of Food in the Last Year: Sometimes true  Transportation Needs: No Transportation Needs (05/30/2023)   Received  from [facility] - Transportation    In the past 12 months, has lack of transportation kept you from medical appointments or from getting medications?: No    Lack of Transportation (Non-Medical): No  Physical Activity: Insufficiently Active (09/29/2022)   Exercise Vital Sign    Days of Exercise per Week: 3 days    Minutes of Exercise per Session: 10 min  Stress: No Stress Concern Present (09/29/2022)   [employer] of Occupational Health - Occupational Stress Questionnaire    Feeling of Stress : Not at all  Social Connections: Moderately Integrated (09/29/2022)   Social Connection and Isolation Panel [NHANES]    Frequency of Communication with Friends and Family: More than three times a week    Frequency of Social Gatherings with Friends and Family: More than three times a week    Attends Religious Services: More than 4 times per year    Active Member of [employer] or Organizations: No    Attends Banker Meetings: Never    Marital Status: Married  Catering manager Violence: Not At Risk (09/29/2022)   Humiliation, Afraid, Rape, and Kick questionnaire    Fear of Current or Ex-Partner: No    Emotionally Abused: No    Physically Abused: No    Sexually Abused: No     Constitutional: Patient reports intermittent headaches, unintentional weight loss.  Denies fever, malaise, fatigue, or abrupt weight changes.  HEENT: Denies eye pain,  eye redness, ear pain, ringing in the ears, wax buildup, runny nose, nasal congestion, bloody nose, or sore throat. Respiratory: Denies difficulty breathing, shortness of breath, cough or sputum production.   Cardiovascular: Denies chest pain, chest tightness, palpitations or swelling in the hands or feet.  Gastrointestinal: Patient reports poor appetite.  Denies abdominal pain, bloating, constipation, diarrhea or blood in the stool.  GU: Denies urgency, frequency, pain with urination, burning sensation, blood in urine, odor or discharge. Musculoskeletal: Patient reports chronic low back pain.  Denies decrease in range of motion, difficulty with gait, muscle pain or joint swelling.  Skin: Denies redness, rashes, lesions or ulcercations.  Neurological: Denies dizziness, difficulty with memory, difficulty with speech or problems with balance and coordination.  Psych: Denies anxiety, depression, SI/HI.  No other specific complaints in a complete review of systems (except as listed in HPI above).  Observations/Objective:  Wt Readings from Last 3 Encounters:  05/08/23 197 lb 6.4 oz (89.5 kg)  03/20/23 201 lb (91.2 kg)  12/07/22 201 lb (91.2 kg)    General: Appears her stated age, obese, in NAD. Pulmonary/Chest: Normal effort No respiratory distress. Neurological: Alert and oriented.   BMET    Component Value Date/Time   NA 142 05/08/2023 1123   K 3.7 05/08/2023 1123   CL 106 05/08/2023 1123   CO2 30 05/08/2023 1123   GLUCOSE 142 (H) 05/08/2023 1123   BUN 7 05/08/2023 1123   CREATININE 0.74 05/08/2023 1123   CALCIUM 9.5 05/08/2023 1123   GFRNONAA >60 09/18/2018 1030   GFRAA >60 09/18/2018 1030    Lipid Panel     Component Value Date/Time   CHOL 135 05/08/2023 1123   TRIG 103 05/08/2023 1123   HDL 51 05/08/2023 1123   CHOLHDL 2.6 05/08/2023 1123   LDLCALC 65 05/08/2023 1123    CBC    Component Value Date/Time   WBC 7.0 05/08/2023 1123   RBC 4.94 05/08/2023 1123   HGB  10.8 (L) 05/08/2023 1123   HCT 35.7 05/08/2023 1123  PLT 272 05/08/2023 1123   MCV 72.3 (L) 05/08/2023 1123   MCH 21.9 (L) 05/08/2023 1123   MCHC 30.3 (L) 05/08/2023 1123   RDW 16.2 (H) 05/08/2023 1123   LYMPHSABS 2.4 06/05/2018 0608   MONOABS 0.5 06/05/2018 0608   EOSABS 0.1 06/05/2018 0608   BASOSABS 0.1 06/05/2018 0608    Hgb A1C Lab Results  Component Value Date   HGBA1C 6.8 (A) 05/08/2023       Assessment and Plan:  Assessment and Plan    Decreased Appetite New onset over the past week with minimal weight loss. No associated nausea, vomiting, abdominal pain, or change in diet. No recent medication changes. Chronic diarrhea reported. No signs of depression or anxiety. Discussed potential use of mirtazapine, but concern for weight gain. -Order stool test for H. pylori to rule out potential cause of decreased appetite. -If H. pylori test is negative and poor appetite persists, consider mirtazapine.  GERD On daily omeprazole. -Continue omeprazole as prescribed.       RTC in 6 months for annual exam  Follow Up Instructions:    I discussed the assessment and treatment plan with the patient. The patient was provided an opportunity to ask questions and all were answered. The patient agreed with the plan and demonstrated an understanding of the instructions.   The patient was advised to call back or seek an in-person evaluation if the symptoms worsen or if the condition fails to improve as anticipated.   Stacy Reaper, NP

## 2023-06-07 NOTE — Telephone Encounter (Signed)
Pt called in and stated that she has an appt with Rene Kocher at 1140 and her phone says she is in the waiting room and someone will come on shortly but she said no one has come on yet. She stated that she is not sure to keep holding or hang up for her appt. Please advise.

## 2023-06-07 NOTE — Patient Instructions (Signed)
Helicobacter Pylori Antibodies Test Why am I having this test? This test is used to check for a type of bacteria called Helicobacter pylori (H. pylori). H. pylori can be found in the cells that line the stomach. Having high levels of H. pylori in your stomach puts you at risk for: Stomach ulcers and small bowel ulcers. Long-term (chronic) inflammation of the lining of the stomach. Ulcers in the part of the body that moves food from the mouth to the stomach (esophagus). Stomach cancer if the infection is not treated. Most people with H. pylori in their stomach have no symptoms. Your health care provider may ask you to have this test if you have symptoms of a stomach ulcer or small bowel ulcer, such as stomach pain before or after eating, heartburn, or nausea after eating. What is being tested? This test checks your blood for antibodies to the H. pylori bacteria. Antibodies are proteins made by your immune system to fight germs and infection. The test checks for antibodies that the immune system produces in response to infection with H. pylori. What kind of sample is taken?  A blood sample is required for this test. It is usually collected by inserting a needle into a blood vessel or by sticking a finger with a small needle. Tell a health care provider about: All medicines you are taking, including vitamins, herbs, eye drops, creams, and over-the-counter medicines. How are the results reported? Your test results will be reported as values that are categorized as positive, negative, or equivocal. Equivocal means that your results are neither positive nor negative. Your health care provider will compare your results to normal ranges that were established after testing a large group of people (reference ranges). Reference ranges may vary among labs and hospitals. For this test, common reference ranges for the two types of antibodies that may be tested are: IgG antibodies: Less than 0.75 units/mL. This  is negative. 1 unit/mL or greater. This is positive. 0.75-0.99 units/mL. This is equivocal. IgM antibodies: 30 units/mL or less. This is negative. 40 units/mL or greater. This is positive. 30.01-39.99 units/mL. This is equivocal. What do the results mean? Test results that are higher than normal, or positive, may indicate various health conditions, such as: Short-term or long-term irritation of the stomach lining (gastritis). Small bowel ulcer. Stomach ulcer. Stomach cancer. Talk with your health care provider about what your results mean. Questions to ask your health care provider Ask your health care provider, or the department that is doing the test: When will my results be ready? How will I get my results? What are my treatment options? What other tests do I need? What are my next steps? Summary This test is used to check for a type of bacteria called Helicobacter pylori (H. pylori). Having high levels of H. pylori in your stomach puts you at risk for ulcers in the gastrointestinal tract or stomach cancer. Most people with H. pylori in their stomach have no symptoms. Your health care provider may ask you to have this test if you have symptoms of a stomach ulcer or small bowel ulcer, such as stomach pain before or after eating, heartburn, or nausea after eating. This test checks your blood for antibodies to the H. pylori bacteria. Talk with your health care provider about what your results mean. This information is not intended to replace advice given to you by your health care provider. Make sure you discuss any questions you have with your health care provider. Document Revised: 10/27/2020  Document Reviewed: 10/27/2020 Elsevier Patient Education  2024 [employer].

## 2023-06-07 NOTE — Telephone Encounter (Signed)
That's fine if she wants to reschedule

## 2023-06-08 ENCOUNTER — Ambulatory Visit: Payer: 59 | Admitting: Internal Medicine

## 2023-06-13 ENCOUNTER — Ambulatory Visit: Payer: Self-pay | Admitting: Internal Medicine

## 2023-06-13 NOTE — Telephone Encounter (Signed)
Copied From CRM [id number]. Reason for Triage: patient would like to know when did she have her last pneumonia vaccination   Attempted to call the patient 3 times without answer. Routing to clinic.

## 2023-06-14 ENCOUNTER — Ambulatory Visit: Payer: Self-pay | Admitting: Internal Medicine

## 2023-06-15 ENCOUNTER — Encounter: Payer: Self-pay | Admitting: Internal Medicine

## 2023-06-15 ENCOUNTER — Ambulatory Visit: Payer: Self-pay | Admitting: Internal Medicine

## 2023-06-15 ENCOUNTER — Other Ambulatory Visit: Payer: Self-pay

## 2023-06-15 ENCOUNTER — Ambulatory Visit: Payer: 59 | Admitting: Internal Medicine

## 2023-06-15 VITALS — BP 124/78 | Ht 66.0 in | Wt 196.6 lb

## 2023-06-15 DIAGNOSIS — R634 Abnormal weight loss: Secondary | ICD-10-CM

## 2023-06-15 DIAGNOSIS — M549 Dorsalgia, unspecified: Secondary | ICD-10-CM | POA: Diagnosis not present

## 2023-06-15 DIAGNOSIS — R630 Anorexia: Secondary | ICD-10-CM

## 2023-06-15 MED ORDER — NAPROXEN 500 MG PO TABS: 500.0000 mg | ORAL_TABLET | Freq: Two times a day (BID) | ORAL | 0 refills | Status: DC

## 2023-06-15 MED ORDER — TRAMADOL HCL 50 MG PO TABS: 50.0000 mg | ORAL_TABLET | Freq: Three times a day (TID) | ORAL | 0 refills | Status: AC | PRN

## 2023-06-15 NOTE — Telephone Encounter (Signed)
This RN made second attempt to contact patient. No answer and vm full so unable to leave a message.

## 2023-06-15 NOTE — Telephone Encounter (Signed)
This RN made first attempt to contact patient. No answer and vm full so unable to leave a message.   Copied from CRM [id number]. Topic: Clinical - Medication Question >> Jun 15, 2023  4:56 PM [place] B wrote: Reason for CRM: pt called in says can't tolerate high does of med. She was prescribed Tramadol 50mg , and wants to know is this a high dosage

## 2023-06-15 NOTE — Telephone Encounter (Signed)
This RN made third attempt to contact patient. No answer and vm full so unable to leave a message.

## 2023-06-15 NOTE — Progress Notes (Signed)
HPI  Discussed the use of AI scribe software for clinical note transcription with the patient, who gave verbal consent to proceed.   Stacy Cunningham is a 73 year old female who presents with new onset upper back pain.  She has been experiencing upper back pain that began over the weekend, located underneath her left shoulder blade. The pain is localized but occasionally extends slightly downward. It is exacerbated by movement, particularly when getting up in the morning or applying pressure to the area. No recent heavy lifting or activities that could have triggered the pain. No associated rashes in the area of pain, and she is not on blood thinners, only taking aspirin. She cannot lay on her left side due to the pain and instead sleeps on her right side.  She uses Lidoderm patches and takes gabapentin for her chronic low back pain, but notes that the Lidoderm patches provide only minimal relief for her current upper back pain. She has tried using a heating pad and cold pad, which offer temporary relief, but the pain returns upon waking. Muscle relaxers have been used but do not alleviate the pain or help her sleep.  She has a history of chronic low back pain for which she takes gabapentin.       Past Medical History:  Diagnosis Date   Anemia    Borderline diabetes mellitus    Diabetes mellitus without complication (HCC)    Elevated lipids    GERD (gastroesophageal reflux disease)    Glaucoma    Headache    h/o migraines   Hypertension    Neuropathy    Palpitations    a. 05/2020 [facility]: Predominantly sinus rhythm, 80 (60-148).  1 SVT run-4 beats at 148.  Rare PACs and PVCs; b. 04/2021 [facility]: Predominantly sinus rhythm, 80 (59-140).  Rare PACs/PVCs.   Panic attack     Current Outpatient Medications  Medication Sig Dispense Refill   aspirin 81 MG EC tablet Take 81 mg by mouth daily.     benzonatate (TESSALON) 100 MG capsule Take 1 capsule (100 mg total) by mouth 3 (three) times daily as  needed for cough. 30 capsule 0   Blood Glucose Monitoring Suppl (ACCU-CHEK GUIDE ME) w/Device KIT      brimonidine (ALPHAGAN) 0.15 % ophthalmic solution Place 1 drop into both eyes 3 (three) times daily.     butalbital-acetaminophen-caffeine (FIORICET) 50-325-40 MG tablet Take 1 tablet by mouth every 4 (four) hours as needed.     clotrimazole-betamethasone (LOTRISONE) cream APPLY TOPICALLY TO AFFECTED AREAS 2 TIMES DAILY 45 g 0   dorzolamide-timolol (COSOPT) 22.3-6.8 MG/ML ophthalmic solution Place 1 drop into both eyes at bedtime.     famotidine (PEPCID) 40 MG tablet Take 1 tablet (40 mg total) by mouth daily. 90 tablet 2   ferrous sulfate 325 (65 FE) MG tablet Take 1 tablet (325 mg total) by mouth 2 (two) times daily with a meal. 180 tablet 1   fexofenadine (ALLEGRA ALLERGY) 180 MG tablet Take 1 tablet (180 mg total) by mouth daily. 90 tablet 0   fluticasone (FLONASE) 50 MCG/ACT nasal spray Place 2 sprays into both nostrils daily. Use for 4-6 weeks then stop and use seasonally or as needed. 16 g 3   gabapentin (NEURONTIN) 800 MG tablet Take one tablet AM, one PM, two QHS 360 tablet 3   hydrochlorothiazide (HYDRODIURIL) 25 MG tablet TAKE 1 TABLET BY MOUTH DAILY 90 tablet 1   ibuprofen (ADVIL) 600 MG tablet Take 1 tablet (600  mg total) by mouth every 8 (eight) hours as needed for moderate pain (pain score 4-6). 100 tablet 2   lidocaine (LIDODERM) 5 % Place 1 patch onto the skin daily. Remove & Discard patch within 12 hours or as directed by MD 90 patch 1   lisinopril (ZESTRIL) 2.5 MG tablet TAKE 1 TABLET BY MOUTH DAILY FOR KIDNEY PROTECTION 90 tablet 1   Magnesium Oxide 400 MG CAPS Take 1 capsule (400 mg total) by mouth daily at 12 noon. 90 capsule 0   metFORMIN (GLUCOPHAGE) 500 MG tablet TAKE 2 TABLETS BY MOUTH DAILY WITH BREAKFAST AND 1 TABLET IN THE EVENING 270 tablet 0   nortriptyline (PAMELOR) 10 MG capsule Take 10 mg by mouth 2 (two) times daily.     omeprazole (PRILOSEC) 20 MG capsule Take 1  capsule (20 mg total) by mouth daily. 90 capsule 1   propranolol (INDERAL) 20 MG tablet Take 1 tablet (20 mg total) by mouth 2 (two) times daily. 180 tablet 0   ROCKLATAN 0.02-0.005 % SOLN Apply to eye.     simvastatin (ZOCOR) 20 MG tablet TAKE 1 TABLET BY MOUTH EVERY EVENING 90 tablet 2   No current facility-administered medications for this visit.    Allergies  Allergen Reactions   Sulfa Antibiotics Dermatitis   Darvon [Propoxyphene] Hives   Other Other (See Comments) and Rash    PAPER TAPE OK TO USE PAPER TAPE OK TO USE    Tape Rash    PAPER TAPE OK TO USE    Family History  Problem Relation Age of Onset   Breast cancer Sister 54   Heart attack Mother 73   Hyperlipidemia Mother    Hypertension Mother     Social History   Socioeconomic History   Marital status: Married    Spouse name: Not on file   Number of children: Not on file   Years of education: Not on file   Highest education level: Not on file  Occupational History   Not on file  Tobacco Use   Smoking status: Never   Smokeless tobacco: Never  Vaping Use   Vaping status: Never Used  Substance and Sexual Activity   Alcohol use: Not Currently    Comment: rare   Drug use: No   Sexual activity: Yes  Other Topics Concern   Not on file  Social History Narrative   Not on file   Social Drivers of Health   Financial Resource Strain: High Risk (05/30/2023)   Received from [facility]   Overall Financial Resource Strain (CARDIA)    Difficulty of Paying Living Expenses: Hard  Food Insecurity: Food Insecurity Present (05/30/2023)   Received from [facility]   Hunger Vital Sign    Worried About Running Out of Food in the Last Year: Sometimes true    Ran Out of Food in the Last Year: Sometimes true  Transportation Needs: No Transportation Needs (05/30/2023)   Received from [facility] - Transportation    In the past 12 months, has lack of  transportation kept you from medical appointments or from getting medications?: No    Lack of Transportation (Non-Medical): No  Physical Activity: Insufficiently Active (09/29/2022)   Exercise Vital Sign    Days of Exercise per Week: 3 days    Minutes of Exercise per Session: 10 min  Stress: No Stress Concern Present (09/29/2022)   [employer] of Occupational Health - Occupational Stress Questionnaire  Feeling of Stress : Not at all  Social Connections: Moderately Integrated (09/29/2022)   Social Connection and Isolation Panel [NHANES]    Frequency of Communication with Friends and Family: More than three times a week    Frequency of Social Gatherings with Friends and Family: More than three times a week    Attends Religious Services: More than 4 times per year    Active Member of [employer] or Organizations: No    Attends Banker Meetings: Never    Marital Status: Married  Catering manager Violence: Not At Risk (09/29/2022)   Humiliation, Afraid, Rape, and Kick questionnaire    Fear of Current or Ex-Partner: No    Emotionally Abused: No    Physically Abused: No    Sexually Abused: No    ROS:  Constitutional: Patient reports daily headaches.  Denies fever, malaise, fatigue, or abrupt weight changes.  Respiratory: Denies difficulty breathing, shortness of breath, cough or sputum production.   Cardiovascular: Denies chest pain, chest tightness, palpitations or swelling in the hands or feet.  Musculoskeletal: Patient reports left upper back pain, chronic low back pain.  Denies decrease in range of motion, difficulty with gait, or joint swelling.  Skin: Denies redness, rashes, lesions or ulcercations.  Neurological: Patient reports neuropathic pain.  Denies dizziness, difficulty with memory, difficulty with speech or problems with balance and coordination.   No other specific complaints in a complete review of systems (except as listed in HPI above).  PE: BP 124/78 (BP  Location: Left Arm, Patient Position: Sitting, Cuff Size: Normal)   Ht 5\' 6"  (1.676 m)   Wt 196 lb 9.6 oz (89.2 kg)   BMI 31.73 kg/m     Wt Readings from Last 3 Encounters:  05/08/23 197 lb 6.4 oz (89.5 kg)  03/20/23 201 lb (91.2 kg)  12/07/22 201 lb (91.2 kg)    General: Appears her stated age, obese, in NAD. Skin: No rash noted. Cardiovascular: Normal rate and rhythm.  Pulmonary/Chest: Normal effort and positive vesicular breath sounds. No respiratory distress. No wheezes, rales or ronchi noted.  Musculoskeletal: Normal flexion, extension and rotation of the spine.  No bony tenderness noted over the thoracic spine.  Pain with palpation in the left subscapular area.  Shoulder shrug equal.  Strength 5/5 BUE.  Handgrips equal.  No difficulty with gait.  Neurological: Alert and oriented.  Coordination normal.     BMET    Component Value Date/Time   NA 142 05/08/2023 1123   K 3.7 05/08/2023 1123   CL 106 05/08/2023 1123   CO2 30 05/08/2023 1123   GLUCOSE 142 (H) 05/08/2023 1123   BUN 7 05/08/2023 1123   CREATININE 0.74 05/08/2023 1123   CALCIUM 9.5 05/08/2023 1123   GFRNONAA >60 09/18/2018 1030   GFRAA >60 09/18/2018 1030    Lipid Panel     Component Value Date/Time   CHOL 135 05/08/2023 1123   TRIG 103 05/08/2023 1123   HDL 51 05/08/2023 1123   CHOLHDL 2.6 05/08/2023 1123   LDLCALC 65 05/08/2023 1123    CBC    Component Value Date/Time   WBC 7.0 05/08/2023 1123   RBC 4.94 05/08/2023 1123   HGB 10.8 (L) 05/08/2023 1123   HCT 35.7 05/08/2023 1123   PLT 272 05/08/2023 1123   MCV 72.3 (L) 05/08/2023 1123   MCH 21.9 (L) 05/08/2023 1123   MCHC 30.3 (L) 05/08/2023 1123   RDW 16.2 (H) 05/08/2023 1123   LYMPHSABS 2.4 06/05/2018 1610  MONOABS 0.5 06/05/2018 0608   EOSABS 0.1 06/05/2018 0608   BASOSABS 0.1 06/05/2018 0608    Hgb A1C Lab Results  Component Value Date   HGBA1C 6.8 (A) 05/08/2023     Assessment and Plan:  Assessment and Plan    Upper Back  Pain New onset, localized to the left shoulder blade area. Likely muscular in nature. No signs of shingles or other skin changes. Pain exacerbated by movement. -Continue gabapentin and muscle relaxers. -Add tramadol 50mg  every 8 hours for 5 days for severe pain. -Rx for naproxen 500 mg twice daily x 5 days, consume with food. -Advise patient to perform stretching exercises and use a pillow for support while sleeping. -If pain worsens, patient to notify the office.  Chronic Low Back Pain Stable on current regimen of gabapentin and Lidoderm patches. -Continue current management.        RTC in 6 months for your annual exam [name], NP

## 2023-06-15 NOTE — Patient Instructions (Signed)

## 2023-06-19 ENCOUNTER — Other Ambulatory Visit: Payer: Self-pay | Admitting: Otolaryngology

## 2023-06-19 DIAGNOSIS — E041 Nontoxic single thyroid nodule: Secondary | ICD-10-CM

## 2023-06-19 NOTE — Progress Notes (Signed)
 Patient for Stacy Cunningham guided FNA LT Inferior thyroid nodule biopsy on Wed 06/20/23, I called and spoke with the patient on the phone and gave pre-procedure instructions. Pt was made aware to be here at 2p and check in at the [facility] registration desk. Pt stated understanding.  Called 06/19/23

## 2023-06-20 ENCOUNTER — Ambulatory Visit
Admission: RE | Admit: 2023-06-20 | Discharge: 2023-06-20 | Disposition: A | Discharge status: Home / Self Care | Payer: 59 | Source: Ambulatory Visit | Attending: Otolaryngology | Admitting: Otolaryngology

## 2023-06-20 DIAGNOSIS — E041 Nontoxic single thyroid nodule: Secondary | ICD-10-CM | POA: Diagnosis not present

## 2023-06-20 MED ORDER — LIDOCAINE HCL (PF) 1 % IJ SOLN
10.0000 mL | Freq: Once | INTRAMUSCULAR | Status: DC
  Filled 2023-06-20: qty 10

## 2023-06-20 NOTE — Procedures (Signed)
 PROCEDURE SUMMARY:  Using direct ultrasound guidance, 5 passes were made using 25 g needles into the nodule within the left lobe of the thyroid.   Ultrasound was used to confirm needle placements on all occasions.   EBL = trace  Specimens were sent to Pathology for analysis.  See procedure note under Imaging tab in Epic for full procedure details.  Jerry Caras Anglea Gordner PA-C 06/20/2023 3:40 PM

## 2023-06-21 LAB — CYTOLOGY - NON PAP

## 2023-06-22 ENCOUNTER — Ambulatory Visit: Payer: Self-pay | Admitting: Internal Medicine

## 2023-06-22 DIAGNOSIS — T7840XA Allergy, unspecified, initial encounter: Secondary | ICD-10-CM | POA: Diagnosis not present

## 2023-06-22 NOTE — Telephone Encounter (Signed)
  Communication  Red Word that prompted transfer to Nurse Triage: Patient nees result from 02/26 Biopsy US Thyroid-Neck has been red where they did the Biopsy                Chief Complaint: Pt. Had thyroid biopsy 06/20/23 , area is red 4-5 inches. No drainage. Symptoms: Mild discomfort Frequency: Last night Pertinent Negatives: Patient denies fever or drainage Disposition: [] ED /[x] Urgent Care (no appt availability in office) / [] Appointment(In office/virtual)/ []  New Site Virtual Care/ [] Home Care/ [] Refused Recommended Disposition /[] [facility]/ []  Follow-up with PCP Additional Notes: No availability, pt. Going to UC.  Reason for Disposition  [1] Incision looks infected (spreading redness, pain) AND [2] large red area (> 2 in. or 5 cm)  Answer Assessment - Initial Assessment Questions 1. SYMPTOM: "What's the main symptom you're concerned about?" (e.g., drainage, incision opening up, pain, redness)     Red 2. ONSET: "When did   start?"     Last night 3. SURGERY: "What surgery did you have?"     Thyroid biopsy 4. DATE of SURGERY: "When was the surgery?"      06/20/23 5. INCISION SITE: "Where is the incision located?"      Neck, front 6. REDNESS: "Is there any redness at the incision site?" If Yes, ask: "How wide across is the redness?" (Inches, centimeters)      4-5 inches 7. PAIN: "Is there any pain?" If Yes, ask: "How bad is it?"  (Scale 1-10; or mild, moderate, severe)   - NONE (0): no pain   - MILD (1-3): doesn't interfere with normal activities    - MODERATE (4-7): interferes with normal activities or awakens from sleep    - SEVERE (8-10): excruciating pain, unable to do any normal activities     Mild 8. BLEEDING: "Is there any bleeding?" If Yes, ask: "How much?" and "Where?"     No 9. DRAINAGE: "Is there any drainage from the incision site?" If Yes, ask: "What color and how much?" (e.g., red, cloudy, pus; drops, teaspoon)     No 10. FEVER: "Do you have a  fever?" If Yes, ask: "What is your temperature, how was it measured, and when did it start?"       No 11. OTHER SYMPTOMS: "Do you have any other symptoms?" (e.g., dizziness, rash elsewhere on body, shaking chills, weakness)       No  Protocols used: Post-Op Incision Symptoms and Questions-A-AH

## 2023-07-31 ENCOUNTER — Other Ambulatory Visit: Payer: Self-pay | Admitting: Internal Medicine

## 2023-07-31 NOTE — Telephone Encounter (Signed)
 Copied from CRM [id number]. Topic: Clinical - Medication Refill >> Jul 31, 2023  3:40 PM Franchot Heidelberg wrote: Most Recent Primary Care Visit:  Provider: Lorre Munroe  Department: [facility]  Visit Type: OFFICE VISIT  Date: 06/15/2023  Medication: benzonatate (TESSALON) 100 MG capsule  Pt says she still coughs and wants to increase the MG.  Has the patient contacted their pharmacy? Yes (Agent: If no, request that the patient contact the pharmacy for the refill. If patient does not wish to contact the pharmacy document the reason why and proceed with request.) (Agent: If yes, when and what did the pharmacy advise?)  Is this the correct pharmacy for this prescription? Yes If no, delete pharmacy and type the correct one.  This is the patient's preferred pharmacy:  [facility] - [place], [place] - [address] [place] [address] Phone: [phone] Fax: [phone]  Has the prescription been filled recently? Yes  Is the patient out of the medication? Yes  Has the patient been seen for an appointment in the last year OR does the patient have an upcoming appointment? Yes  Can we respond through MyChart? Yes  Agent: Please be advised that Rx refills may take up to 3 business days. We ask that you follow-up with your pharmacy.

## 2023-08-01 MED ORDER — BENZONATATE 100 MG PO CAPS: 100.0000 mg | ORAL_CAPSULE | Freq: Three times a day (TID) | ORAL | 0 refills | Status: DC | PRN

## 2023-08-01 NOTE — Telephone Encounter (Signed)
 Requested Prescriptions  Pending Prescriptions Disp Refills   benzonatate (TESSALON) 100 MG capsule 30 capsule 0    Sig: Take 1 capsule (100 mg total) by mouth 3 (three) times daily as needed for cough.     Ear, Nose, and Throat:  Antitussives/Expectorants Passed - 08/01/2023  1:52 PM      Passed - Valid encounter within last 12 months    Recent Outpatient Visits           1 month ago Upper back pain on left side   [facility] [place], Salvadore Oxford, NP   1 month ago Poor appetite   [facility] [place], Salvadore Oxford, NP       Future Appointments             In 4 months Baity, Salvadore Oxford, NP [facility], [facility]

## 2023-08-02 ENCOUNTER — Telehealth: Payer: Self-pay

## 2023-08-02 NOTE — Telephone Encounter (Signed)
 Copied from CRM [id number]. Topic: Clinical - Medical Advice >> Aug 02, 2023  3:15 PM Stacy Cunningham wrote: Reason for CRM: Patient called in stating that she has been sneezing,cough,and head congestion,throat itching She would like suggestions on what she could possibly take over the counter to help with these symptoms? She stated she knows its just sinuses.

## 2023-08-03 NOTE — Telephone Encounter (Unsigned)
 Copied from CRM [id number]. Topic: Appointments - Transfer of Care >> Aug 03, 2023  1:55 PM Gildardo Pounds wrote: Pt is requesting to transfer FROM: Lorre Munroe, NP Pt is requesting to transfer TO: [facility] NP Reason for requested transfer: preference It is the responsibility of the team the patient would like to transfer to [facility]) to reach out to the patient if for any reason this transfer is not acceptable.

## 2023-08-08 NOTE — Telephone Encounter (Signed)
 Appt scheduled

## 2023-08-14 ENCOUNTER — Ambulatory Visit (INDEPENDENT_AMBULATORY_CARE_PROVIDER_SITE_OTHER): Admitting: Internal Medicine

## 2023-08-14 ENCOUNTER — Encounter: Payer: Self-pay | Admitting: Internal Medicine

## 2023-08-14 VITALS — BP 128/88 | Ht 66.0 in | Wt 186.0 lb

## 2023-08-14 DIAGNOSIS — I10 Essential (primary) hypertension: Secondary | ICD-10-CM | POA: Diagnosis not present

## 2023-08-14 DIAGNOSIS — G43719 Chronic migraine without aura, intractable, without status migrainosus: Secondary | ICD-10-CM | POA: Diagnosis not present

## 2023-08-14 MED ORDER — PROPRANOLOL HCL ER 60 MG PO CP24: 60.0000 mg | ORAL_CAPSULE | Freq: Every day | ORAL | 0 refills | Status: DC

## 2023-08-14 NOTE — Patient Instructions (Signed)
 How to Take Your Blood Pressure Blood pressure measures how strongly your blood is pressing against the walls of your arteries. Arteries are blood vessels that carry blood from your heart throughout your body. You can take your blood pressure at home with a machine. You may need to check your blood pressure at home: To check if you have high blood pressure (hypertension). To check your blood pressure over time. To make sure your blood pressure medicine is working. Supplies needed: Blood pressure machine, or monitor. A chair to sit in. This should be a chair where you can sit upright with your back supported. Do not sit on a soft couch or an armchair. Table or desk. Small notebook. Pencil or pen. How to prepare Avoid these things for 30 minutes before checking your blood pressure: Having drinks with caffeine in them, such as coffee or tea. Drinking alcohol. Eating. Smoking. Exercising. Do these things five minutes before checking your blood pressure: Go to the bathroom and pee (urinate). Sit in a chair. Be quiet. Do not talk. How to take your blood pressure Follow the instructions that came with your machine. If you have a digital blood pressure monitor, these may be the instructions: Sit up straight. Place your feet on the floor. Do not cross your ankles or legs. Rest your left arm at the level of your heart. You may rest it on a table, desk, or chair. Pull up your shirt sleeve. Wrap the blood pressure cuff around the upper part of your left arm. The cuff should be 1 inch (2.5 cm) above your elbow. It is best to wrap the cuff around bare skin. Fit the cuff snugly around your arm, but not too tightly. You should be able to place only one finger between the cuff and your arm. Place the cord so that it rests in the bend of your elbow. Press the power button. Sit quietly while the cuff fills with air and loses air. Write down the numbers on the screen. Wait 2-3 minutes and then repeat  steps 1-10. What do the numbers mean? Two numbers make up your blood pressure. The first number is called systolic pressure. The second is called diastolic pressure. An example of a blood pressure reading is "120 over 80" (or 120/80). If you are an adult and do not have a medical condition, use this guide to find out if your blood pressure is normal: Normal First number: below 120. Second number: below 80. Elevated First number: 120-129. Second number: below 80. Hypertension stage 1 First number: 130-139. Second number: 80-89. Hypertension stage 2 First number: 140 or above. Second number: 90 or above. Your blood pressure is above normal even if only the first or only the second number is above normal. Follow these instructions at home: Medicines Take over-the-counter and prescription medicines only as told by your doctor. Tell your doctor if your medicine is causing side effects. General instructions Check your blood pressure as often as your doctor tells you to. Check your blood pressure at the same time every day. Take your monitor to your next doctor's appointment. Your doctor will: Make sure you are using it correctly. Make sure it is working right. Understand what your blood pressure numbers should be. Keep all follow-up visits. General tips You will need a blood pressure machine or monitor. Your doctor can suggest a monitor. You can buy one at a drugstore or online. When choosing one: Choose one with an arm cuff. Choose one that wraps around your  upper arm. Only one finger should fit between your arm and the cuff. Do not choose one that measures your blood pressure from your wrist or finger. Where to find more information American Heart Association: www.heart.org Contact a doctor if: Your blood pressure keeps being high. Your blood pressure is suddenly low. Get help right away if: Your first blood pressure number is higher than 180. Your second blood pressure number is  higher than 120. These symptoms may be an emergency. Do not wait to see if the symptoms will go away. Get help right away. Call 911. Summary Check your blood pressure at the same time every day. Avoid caffeine, alcohol, smoking, and exercise for 30 minutes before checking your blood pressure. Make sure you understand what your blood pressure numbers should be. This information is not intended to replace advice given to you by your health care provider. Make sure you discuss any questions you have with your health care provider. Document Revised: 12/23/2020 Document Reviewed: 12/23/2020 Elsevier Patient Education  2024 [employer].

## 2023-08-14 NOTE — Progress Notes (Signed)
 HPI  Discussed the use of AI scribe software for clinical note transcription with the patient, who gave verbal consent to proceed.   Stacy Cunningham is a 73 year old female with hypertension who presents with concerns about elevated blood pressure readings.  She has been experiencing fluctuating blood pressure readings at home, ranging from 112/80 to 157/99. A recent visit to urgent care was prompted by feeling unwell, where her blood pressure was recorded at 174/105. Recent home readings have been lower, at 115/80 and 112/80.  Her current medication regimen includes lisinopril , hydrochlorothiazide , and propranolol . Lisinopril  is used for kidney protection, hydrochlorothiazide  for blood pressure management, and propranolol  for migraine management, with a current dose of 20 mg twice daily.  She experiences chronic headaches, describing them as a 'nag' every day, and is unsure if her headache medication is strong enough. No chest pain or shortness of breath. She maintains adequate hydration, drinking multiple bottles of water daily, and denies any swelling.       Past Medical History:  Diagnosis Date   Anemia    Borderline diabetes mellitus    Diabetes mellitus without complication (HCC)    Elevated lipids    GERD (gastroesophageal reflux disease)    Glaucoma    Headache    h/o migraines   Hypertension    Neuropathy    Palpitations    a. 05/2020 [facility]: Predominantly sinus rhythm, 80 (60-148).  1 SVT run-4 beats at 148.  Rare PACs and PVCs; b. 04/2021 [facility]: Predominantly sinus rhythm, 80 (59-140).  Rare PACs/PVCs.   Panic attack     Current Outpatient Medications  Medication Sig Dispense Refill   aspirin 81 MG EC tablet Take 81 mg by mouth daily.     benzonatate  (TESSALON ) 100 MG capsule Take 1 capsule (100 mg total) by mouth 3 (three) times daily as needed for cough. 30 capsule 0   Blood Glucose Monitoring Suppl (ACCU-CHEK GUIDE ME) w/Device KIT      brimonidine (ALPHAGAN) 0.15 %  ophthalmic solution Place 1 drop into both eyes 3 (three) times daily.     butalbital-acetaminophen -caffeine (FIORICET) 50-325-40 MG tablet Take 1 tablet by mouth every 4 (four) hours as needed.     clotrimazole -betamethasone  (LOTRISONE ) cream APPLY TOPICALLY TO AFFECTED AREAS 2 TIMES DAILY 45 g 0   dorzolamide-timolol  (COSOPT) 22.3-6.8 MG/ML ophthalmic solution Place 1 drop into both eyes at bedtime.     famotidine  (PEPCID ) 40 MG tablet Take 1 tablet (40 mg total) by mouth daily. 90 tablet 2   ferrous sulfate  325 (65 FE) MG tablet Take 1 tablet (325 mg total) by mouth 2 (two) times daily with a meal. 180 tablet 1   fexofenadine  (ALLEGRA  ALLERGY) 180 MG tablet Take 1 tablet (180 mg total) by mouth daily. 90 tablet 0   fluticasone  (FLONASE ) 50 MCG/ACT nasal spray Place 2 sprays into both nostrils daily. Use for 4-6 weeks then stop and use seasonally or as needed. 16 g 3   gabapentin  (NEURONTIN ) 800 MG tablet Take one tablet AM, one PM, two QHS 360 tablet 3   hydrochlorothiazide  (HYDRODIURIL ) 25 MG tablet TAKE 1 TABLET BY MOUTH DAILY 90 tablet 1   ibuprofen  (ADVIL ) 600 MG tablet Take 1 tablet (600 mg total) by mouth every 8 (eight) hours as needed for moderate pain (pain score 4-6). 100 tablet 2   lidocaine  (LIDODERM ) 5 % Place 1 patch onto the skin daily. Remove & Discard patch within 12 hours or as directed by MD 90 patch 1  lisinopril  (ZESTRIL ) 2.5 MG tablet TAKE 1 TABLET BY MOUTH DAILY FOR KIDNEY PROTECTION 90 tablet 1   Magnesium  Oxide 400 MG CAPS Take 1 capsule (400 mg total) by mouth daily at 12 noon. 90 capsule 0   metFORMIN  (GLUCOPHAGE ) 500 MG tablet TAKE 2 TABLETS BY MOUTH DAILY WITH BREAKFAST AND 1 TABLET IN THE EVENING 270 tablet 0   naproxen  (NAPROSYN ) 500 MG tablet Take 1 tablet (500 mg total) by mouth 2 (two) times daily with a meal. 10 tablet 0   nortriptyline (PAMELOR) 10 MG capsule Take 10 mg by mouth 2 (two) times daily.     omeprazole  (PRILOSEC) 20 MG capsule Take 1 capsule (20 mg  total) by mouth daily. 90 capsule 1   propranolol  (INDERAL ) 20 MG tablet Take 1 tablet (20 mg total) by mouth 2 (two) times daily. 180 tablet 0   ROCKLATAN 0.02-0.005 % SOLN Apply to eye.     simvastatin  (ZOCOR ) 20 MG tablet TAKE 1 TABLET BY MOUTH EVERY EVENING 90 tablet 2   No current facility-administered medications for this visit.    Allergies  Allergen Reactions   Sulfa Antibiotics Dermatitis   Darvon [Propoxyphene] Hives   Other Other (See Comments) and Rash    PAPER TAPE OK TO USE PAPER TAPE OK TO USE    Tape Rash    PAPER TAPE OK TO USE    Family History  Problem Relation Age of Onset   Breast cancer Sister 14   Heart attack Mother 14   Hyperlipidemia Mother    Hypertension Mother     Social History   Socioeconomic History   Marital status: Married    Spouse name: Not on file   Number of children: Not on file   Years of education: Not on file   Highest education level: Not on file  Occupational History   Not on file  Tobacco Use   Smoking status: Never   Smokeless tobacco: Never  Vaping Use   Vaping status: Never Used  Substance and Sexual Activity   Alcohol use: Not Currently    Comment: rare   Drug use: No   Sexual activity: Yes  Other Topics Concern   Not on file  Social History Narrative   Not on file   Social Drivers of Health   Financial Resource Strain: High Risk (05/30/2023)   Received from [facility]   Overall Financial Resource Strain (CARDIA)    Difficulty of Paying Living Expenses: Hard  Food Insecurity: Food Insecurity Present (05/30/2023)   Received from [facility]   Hunger Vital Sign    Worried About Running Out of Food in the Last Year: Sometimes true    Ran Out of Food in the Last Year: Sometimes true  Transportation Needs: No Transportation Needs (05/30/2023)   Received from [facility] - Transportation    In the past 12 months, has lack of transportation kept you  from medical appointments or from getting medications?: No    Lack of Transportation (Non-Medical): No  Physical Activity: Insufficiently Active (09/29/2022)   Exercise Vital Sign    Days of Exercise per Week: 3 days    Minutes of Exercise per Session: 10 min  Stress: No Stress Concern Present (09/29/2022)   [employer] of Occupational Health - Occupational Stress Questionnaire    Feeling of Stress : Not at all  Social Connections: Moderately Integrated (09/29/2022)   Social Connection and Isolation Panel [NHANES]  Frequency of Communication with Friends and Family: More than three times a week    Frequency of Social Gatherings with Friends and Family: More than three times a week    Attends Religious Services: More than 4 times per year    Active Member of [employer] or Organizations: No    Attends Banker Meetings: Never    Marital Status: Married  Catering manager Violence: Not At Risk (09/29/2022)   Humiliation, Afraid, Rape, and Kick questionnaire    Fear of Current or Ex-Partner: No    Emotionally Abused: No    Physically Abused: No    Sexually Abused: No    ROS:  Constitutional: Patient reports daily headaches.  Denies fever, malaise, fatigue, or abrupt weight changes.  HEENT: Denies eye pain, eye redness, ear pain, ringing in the ears, wax buildup, runny nose, nasal congestion, bloody nose, or sore throat. Respiratory: Denies difficulty breathing, shortness of breath, cough or sputum production.   Cardiovascular: Denies chest pain, chest tightness, palpitations or swelling in the hands or feet.  Musculoskeletal: Patient reports chronic low back pain.  Denies decrease in range of motion, difficulty with gait, muscle pain or joint swelling.  Skin: Denies redness, rashes, lesions or ulcercations.  Neurological: Patient reports neuropathic pain.  Denies dizziness, difficulty with memory, difficulty with speech or problems with balance and coordination.    No other  specific complaints in a complete review of systems (except as listed in HPI above).  PE: BP 128/88 (BP Location: Left Arm, Patient Position: Sitting, Cuff Size: Normal)   Ht 5\' 6"  (1.676 m)   Wt 186 lb (84.4 kg)   BMI 30.02 kg/m    Wt Readings from Last 3 Encounters:  06/15/23 196 lb 9.6 oz (89.2 kg)  05/08/23 197 lb 6.4 oz (89.5 kg)  03/20/23 201 lb (91.2 kg)    General: Appears her stated age, obese, in NAD. HEENT: Head: normal shape and size; Eyes: sclera white, no icterus, conjunctiva pink, PERRLA and EOMs intact;  Cardiovascular: Normal rate and rhythm. S1,S2 noted.  No murmur, rubs or gallops noted. No JVD or BLE edema. Pulmonary/Chest: Normal effort and positive vesicular breath sounds. No respiratory distress. No wheezes, rales or ronchi noted.  Musculoskeletal: No difficulty with gait.  Neurological: Alert and oriented. Cranial nerves II-XII grossly intact. Coordination normal.     BMET    Component Value Date/Time   NA 142 05/08/2023 1123   K 3.7 05/08/2023 1123   CL 106 05/08/2023 1123   CO2 30 05/08/2023 1123   GLUCOSE 142 (H) 05/08/2023 1123   BUN 7 05/08/2023 1123   CREATININE 0.74 05/08/2023 1123   CALCIUM 9.5 05/08/2023 1123   GFRNONAA >60 09/18/2018 1030   GFRAA >60 09/18/2018 1030    Lipid Panel     Component Value Date/Time   CHOL 135 05/08/2023 1123   TRIG 103 05/08/2023 1123   HDL 51 05/08/2023 1123   CHOLHDL 2.6 05/08/2023 1123   LDLCALC 65 05/08/2023 1123    CBC    Component Value Date/Time   WBC 7.0 05/08/2023 1123   RBC 4.94 05/08/2023 1123   HGB 10.8 (L) 05/08/2023 1123   HCT 35.7 05/08/2023 1123   PLT 272 05/08/2023 1123   MCV 72.3 (L) 05/08/2023 1123   MCH 21.9 (L) 05/08/2023 1123   MCHC 30.3 (L) 05/08/2023 1123   RDW 16.2 (H) 05/08/2023 1123   LYMPHSABS 2.4 06/05/2018 0608   MONOABS 0.5 06/05/2018 0608   EOSABS 0.1 06/05/2018 6962  BASOSABS 0.1 06/05/2018 0608    Hgb A1C Lab Results  Component Value Date   HGBA1C  6.8 (A) 05/08/2023     Assessment and Plan:  Assessment and Plan    Hypertension Intermittent elevated blood pressure. Current medications include lisinopril , hydrochlorothiazide , and propranolol . Propranolol  adjusted to extended release for better control. - Continue lisinopril  for kidney protection. - Continue hydrochlorothiazide  for blood pressure management. - Start propranolol  extended release 60 mg once daily. - Advise against frequent back-to-back blood pressure checks. - Instruct to monitor for symptoms such as lightheadedness, dizziness, or severe headache before checking blood pressure.  Chronic headaches Chronic headaches possibly linked to blood pressure fluctuations. Adjusted propranolol  to extended release for improved headache control. -Continue nortriptyline as previously prescribed - Monitor headache symptoms with new propranolol  regimen.  - Continue Fioricet as needed for breakthrough       RTC in 4 months for your annual exam Helayne Lo, NP

## 2023-08-24 ENCOUNTER — Encounter: Payer: Self-pay | Admitting: Family Medicine

## 2023-08-24 ENCOUNTER — Ambulatory Visit (INDEPENDENT_AMBULATORY_CARE_PROVIDER_SITE_OTHER): Admitting: Family Medicine

## 2023-08-24 ENCOUNTER — Ambulatory Visit: Payer: Self-pay | Admitting: *Deleted

## 2023-08-24 VITALS — BP 132/84 | HR 82 | Ht 66.0 in | Wt 185.4 lb

## 2023-08-24 DIAGNOSIS — M25471 Effusion, right ankle: Secondary | ICD-10-CM

## 2023-08-24 DIAGNOSIS — H8113 Benign paroxysmal vertigo, bilateral: Secondary | ICD-10-CM | POA: Diagnosis not present

## 2023-08-24 DIAGNOSIS — M25472 Effusion, left ankle: Secondary | ICD-10-CM | POA: Diagnosis not present

## 2023-08-24 NOTE — Progress Notes (Signed)
 Subjective:    Patient ID: Stacy Cunningham, female    DOB: Apr 13, 1951, 73 y.o.   MRN: [id number]  Stacy Cunningham is a 73 y.o. female presenting on 08/24/2023 for Dizziness (Vertigo)  Patient presents for a same day appointment.  PCP Helayne Lo, FNP   HPI  Discussed the use of AI scribe software for clinical note transcription with the patient, who gave verbal consent to proceed.  History of Present Illness   Stacy Cunningham is a 73 year old female who presents with episodes of dizziness and vertigo.  She experiences episodes of dizziness and vertigo, described as a sensation of the room spinning, accompanied by lightheadedness and a feeling of being off balance. These episodes have increased in frequency, occurring twice in the past week, with the most recent episode last night. During these episodes, she feels the need to hold onto something to prevent falling. The dizziness lasts for about a minute, after which she feels woozy and off balance. No recent changes in blood pressure during dizzy spells, and she has not checked her blood pressure during these episodes.  She has a history of sinus issues and allergies, which she experienced about a week ago. She suffers from migraines but notes that the dizziness is not associated with her headaches. She is currently taking propranolol , which was recently adjusted to 60 mg, but she has not started the new dosage yet. She also takes nortriptyline for restless legs, which helps with the restlessness but not with the swelling in her ankles.  She mentions swelling in her ankles, which she attributes to fluid retention. The swelling is described as soft and not painful, but it causes a burning sensation, especially at night, affecting her sleep. She is diabetic and manages the swelling with compression and elevation.  Note she saw PCP recently but the vertigo dizzy spells had resolved. Now she had one return episode. Note she has not started or switched  from Propranolol  IR to ER yet            08/24/2023    3:22 PM 08/14/2023    1:59 PM 12/07/2022    9:15 AM  Depression screen PHQ 2/9  Decreased Interest 0 1 1  Down, Depressed, Hopeless 0 0 0  PHQ - 2 Score 0 1 1  Altered sleeping 3 2   Tired, decreased energy 1 1   Change in appetite 0 2   Feeling bad or failure about yourself  0 0   Trouble concentrating 0 0   Moving slowly or fidgety/restless 0 0   Suicidal thoughts 0 0   PHQ-9 Score 4 6   Difficult doing work/chores Somewhat difficult Not difficult at all        08/24/2023    3:22 PM 08/14/2023    1:59 PM 12/07/2022    9:15 AM 05/08/2022   10:45 AM  GAD 7 : Generalized Anxiety Score  Nervous, Anxious, on Edge 0 1 0 0  Control/stop worrying 0 0 0 0  Worry too much - different things 0 0 0 1  Trouble relaxing 1 1 0 1  Restless 0 0 0 1  Easily annoyed or irritable 0 0 0 1  Afraid - awful might happen 0 0 0 0  Total GAD 7 Score 1 2 0 4  Anxiety Difficulty  Not difficult at all Not difficult at all Not difficult at all    Social History   Tobacco Use   Smoking status: Never   Smokeless  tobacco: Never  Vaping Use   Vaping status: Never Used  Substance Use Topics   Alcohol use: Not Currently    Comment: rare   Drug use: No    Review of Systems Per HPI unless specifically indicated above     Objective:    BP 132/84 (BP Location: Right Arm, Patient Position: Sitting, Cuff Size: Normal)   Pulse 82   Ht 5\' 6"  (1.676 m)   Wt 185 lb 6 oz (84.1 kg)   SpO2 100%   BMI 29.92 kg/m   Wt Readings from Last 3 Encounters:  08/24/23 185 lb 6 oz (84.1 kg)  08/14/23 186 lb (84.4 kg)  06/15/23 196 lb 9.6 oz (89.2 kg)    Physical Exam Vitals and nursing note reviewed.  Constitutional:      General: She is not in acute distress.    Appearance: She is well-developed. She is not diaphoretic.     Comments: Well-appearing, comfortable, cooperative  HENT:     Head: Normocephalic and atraumatic.     Comments:  Dix-Hallpike maneuver questionable positive on LEFT. Worse provoked on sitting up however. No nystagmus identified.    Right Ear: Tympanic membrane, ear canal and external ear normal. There is no impacted cerumen.     Left Ear: Tympanic membrane, ear canal and external ear normal. There is no impacted cerumen.  Eyes:     General:        Right eye: No discharge.        Left eye: No discharge.     Conjunctiva/sclera: Conjunctivae normal.  Neck:     Thyroid : No thyromegaly.  Cardiovascular:     Rate and Rhythm: Normal rate and regular rhythm.     Heart sounds: Normal heart sounds. No murmur heard. Pulmonary:     Effort: Pulmonary effort is normal. No respiratory distress.     Breath sounds: Normal breath sounds. No wheezing or rales.  Musculoskeletal:        General: Normal range of motion.     Cervical back: Normal range of motion and neck supple.     Right lower leg: Edema (trace only bilateral ankles, not significant today. seems improved.) present.     Left lower leg: Edema present.  Lymphadenopathy:     Cervical: No cervical adenopathy.  Skin:    General: Skin is warm and dry.     Findings: No erythema or rash.  Neurological:     Mental Status: She is alert and oriented to person, place, and time.  Psychiatric:        Behavior: Behavior normal.     Comments: Well groomed, good eye contact, normal speech and thoughts     Results for orders placed or performed during the hospital encounter of 06/20/23  Cytology - Non PAP;   Collection Time: 06/20/23 12:00 AM  Result Value Ref Range   CYTOLOGY - NON GYN      CYTOLOGY - NON PAP [facility] [address] [place], [place] [address] Telephone [phone] or [phone] Fax [phone]  CYTOPATHOLOGY REPORT   Accession #: [id number] Patient Name: Stacy Cunningham, Stacy Cunningham Visit # : [id number]  MRN: [id number] Physician: Anice Kerbs Nov 14, 1950 (Age: 80) Gender: F Collected Date:  06/20/2023 Received Date: 06/20/2023  FINAL DIAGNOSIS STATEMENT Of SPECIMEN ADEQUACY:  INTERPRETATION(S):      - BENIGN (BETHESDA II).      - CONSISTENT WITH BENIGN FOLLICULAR NODULE.   Diagnosis Note : Per [facility] the  patient underwent FNA of a 3.8 cm TR3 nodule in the left inferior thyroid . Prior thyroid  FNA notes at least six thyroid  nodules, bilateral. The specimen is technically adequate though there is a relative low cellularity of follicular cells. The specimen consists primarily of hemosiderin-laden macrophages.    DATE SIGNED OUT: 06/21/2023 ELECTRONIC SIGNATURE : Brunetta Capes Md, Alexandria Ida , Pathologist,  Electronic Signature   CASE COMMENTS   CLINICAL HISTORY  SOURCE OF SPECIMEN(S) Thyroid , Fine Needle Aspiration  SPECIMEN COMMENTS: SPECIMEN CLINICAL INFORMATION: 1. Left cystic/solid thyroid  nodule with increase in size    Gross Description Specimen: Received is/are 12cc of dark pink fluid in a sterile vial      Prepared:      # Smears: 0      # Concentration Technique Slides (i.e. ThinPrep): 1      # Cell Block: 0      # Diff-Quick Stain: 0      ThyroSeq testing will be performed if clinically indicated        Report signed out from the following location(s) [place] [address] [place], [place] [address] CLIA #: [id number]  [facility] [address] [place], [place] [address] CLIA #: [id number]       Assessment & Plan:   Problem List Items Addressed This Visit   None Visit Diagnoses       BPPV (benign paroxysmal positional vertigo), bilateral    -  Primary     Ankle edema, bilateral          Vertigo suspected BPPV Based on history and provoked with head turning movements Intermittent episodes likely due to inner ear dysfunction. No central nervous system involvement. Differential includes inner ear issues exacerbated by allergies or sinus problems. - Instructed on Epley maneuver for home exercise. -  Provided diagrams and instructions for Epley maneuver. - Offered referral to therapist for assistance with exercises if needed. ( May be difficult due to her back pain)  RLS / Lower Extremity Edema / Neuropathy She says RLS is controlled on medicine  Bilateral ankle swelling likely due to gravity-dependent fluid retention. No redness, warmth, or firmness. Not concerning for acute pathology. - Recommend compression stockings. - Advise elevation of legs above heart level when resting.  Migraine Dizziness episodes not associated with headaches.        No orders of the defined types were placed in this encounter.   No orders of the defined types were placed in this encounter.   Follow up plan: Return if symptoms worsen or fail to improve.   Domingo Friend, DO [facility] 08/24/2023, 3:43 PM

## 2023-08-24 NOTE — Telephone Encounter (Signed)
  Chief Complaint: dizziness occurring more often  Symptoms: dizziness last night had to hold wall to walk. Felt faint . Denies dizziness now . Has been seen for dizziness from providers before. Staying hydrated. Takes multiple meds for HTN  reports blood glucose WNL and last BP check with PCP good  Frequency: 08/14/23 Pertinent Negatives: Patient denies chest pain no difficulty breathing no fever reported no headaches  Disposition: [] ED /[] Urgent Care (no appt availability in office) / [x] Appointment(In office/virtual)/ []  [facility]/ [] Home Care/ [] Refused Recommended Disposition /[] [facility]/ []  Follow-up with PCP Additional Notes:   Appt scheduled for today , none available with PCP until next week. Due to episode last night recommended evaluation.      Copied from CRM [id number]. Topic: Clinical - Red Word Triage >> Aug 24, 2023  8:27 AM Georgeann Kindred wrote: Red Word that prompted transfer to Nurse Triage: Patient has been experiencing intermittent dizziness. Patient states that she felt weak, faint, and like the room was spinning. She states that she had to hold on to the wall to gain stability and the symptoms subside. In addition, this has twice before  04/22. Reason for Disposition  Taking a medicine that could cause dizziness (e.g., blood pressure medications, diuretics)  Answer Assessment - Initial Assessment Questions 1. DESCRIPTION: "Describe your dizziness."     lightheaded 2. LIGHTHEADED: "Do you feel lightheaded?" (e.g., somewhat faint, woozy, weak upon standing)     Weak upon standing  3. VERTIGO: "Do you feel like either you or the room is spinning or tilting?" (i.e. vertigo)     na 4. SEVERITY: "How bad is it?"  "Do you feel like you are going to faint?" "Can you stand and walk?"   - MILD: Feels slightly dizzy, but walking normally.   - MODERATE: Feels unsteady when walking, but not falling; interferes with normal activities (e.g., school, work).   -  SEVERE: Unable to walk without falling, or requires assistance to walk without falling; feels like passing out now.      Moderate  5. ONSET:  "When did the dizziness begin?"     Last night  6. AGGRAVATING FACTORS: "Does anything make it worse?" (e.g., standing, change in head position)     Standing  7. HEART RATE: "Can you tell me your heart rate?" "How many beats in 15 seconds?"  (Note: not all patients can do this)       na 8. CAUSE: "What do you think is causing the dizziness?"     Not sure  9. RECURRENT SYMPTOM: "Have you had dizziness before?" If Yes, ask: "When was the last time?" "What happened that time?"     Yes  10. OTHER SYMPTOMS: "Do you have any other symptoms?" (e.g., fever, chest pain, vomiting, diarrhea, bleeding)       Dizziness lst night had to hold wall to walk 11. PREGNANCY: "Is there any chance you are pregnant?" "When was your last menstrual period?"       na  Protocols used: Dizziness - Lightheadedness-A-AH

## 2023-08-24 NOTE — Patient Instructions (Addendum)
 Thank you for coming to the office today.  The swelling in ankles is okay, it is mild and likely due to   Use RICE therapy: - R - Rest / relative rest with activity modification avoid overuse of joint - I - Ice packs (make sure you use a towel or sock / something to protect skin) - C - Compression with compression socks to apply pressure and reduce swelling allowing more support - E - Elevation - if significant swelling, lift leg above heart level (toes above your nose) to help reduce swelling, most helpful at night after day of being on your feet  You have symptoms of Vertigo (Benign Paroxysmal Positional Vertigo) - This is commonly caused by inner ear fluid imbalance, sometimes can be worsened by allergies and sinus symptoms, otherwise it can occur randomly sometimes and we may never discover the exact cause. - To treat this, try the Epley Manuever (see diagrams/instructions below) at home up to 3 times a day for 1-2 weeks or until symptoms resolve  No medication required but we can order Meclizine if needed. - You may take Meclizine as needed up to 3 times a day for dizziness, this will not cure symptoms but may help. Caution may make you drowsy.  If you develop significant worsening episode with vertigo that does not improve and you get severe headache, loss of vision, arm or leg weakness, slurred speech, or other concerning symptoms please seek immediate medical attention at Emergency Department.  See the next page for images describing the Epley Manuever.     ----------------------------------------------------------------------------------------------------------------------       Please schedule a Follow-up Appointment to: Return if symptoms worsen or fail to improve.  If you have any other questions or concerns, please feel free to call the office or send a message through MyChart. You may also schedule an earlier appointment if necessary.  Additionally, you may be  receiving a survey about your experience at our office within a few days to 1 week by e-mail or mail. We value your feedback.  Domingo Friend, DO [facility], [place]

## 2023-08-29 ENCOUNTER — Ambulatory Visit: Payer: Self-pay | Admitting: *Deleted

## 2023-08-29 ENCOUNTER — Ambulatory Visit: Payer: Self-pay

## 2023-08-29 ENCOUNTER — Telehealth: Payer: Self-pay | Admitting: Internal Medicine

## 2023-08-29 ENCOUNTER — Other Ambulatory Visit: Payer: Self-pay | Admitting: Internal Medicine

## 2023-08-29 NOTE — Telephone Encounter (Signed)
 This RN made the first attempt to contact the pt. Pt did not answer, RN LVM with a callback number.  Copied from CRM [id number]. Topic: Clinical - Pink Word Triage >> Aug 29, 2023 10:36 AM Essie A wrote: Reason for Triage: Patient complaining of leg cramps in both legs.

## 2023-08-29 NOTE — Telephone Encounter (Signed)
 Duplicate

## 2023-08-29 NOTE — Telephone Encounter (Signed)
 Spoke with patient, stated she had been up all night with leg cramps and pain. Any recommendations?

## 2023-08-29 NOTE — Telephone Encounter (Signed)
 This RN made a second attempt to contact the pt. Pt did not answer. RN LVM with a callback number.

## 2023-08-29 NOTE — Telephone Encounter (Signed)
 Attempted to contact patient- no answer- left message to call office regarding symptoms.    Summary: Leg cramps   Copied From CRM [id number]. Reason for Triage: Patient complaining of leg cramps in both legs.

## 2023-08-29 NOTE — Telephone Encounter (Signed)
 Multiple attempts to contact patient- messages left- see duplicate note for other attempts

## 2023-08-29 NOTE — Telephone Encounter (Signed)
 Copied from CRM [id number]. Topic: Clinical - Medication Refill >> Aug 29, 2023 10:39 AM Essie A wrote: Medication: Magnesium  Oxide 400 MG CAPS  Has the patient contacted their pharmacy? No Medication was discontinued (Agent: If no, request that the patient contact the pharmacy for the refill. If patient does not wish to contact the pharmacy document the reason why and proceed with request.) (Agent: If yes, when and what did the pharmacy advise?)  This is the patient's preferred pharmacy:  [facility] - [place], [place] - [address] [place] [address] Phone: [phone] Fax: [phone]  Is this the correct pharmacy for this prescription? Yes If no, delete pharmacy and type the correct one.   Has the prescription been filled recently? No  Is the patient out of the medication? Yes  Has the patient been seen for an appointment in the last year OR does the patient have an upcoming appointment? Yes  Can we respond through MyChart? No  Agent: Please be advised that Rx refills may take up to 3 business days. We ask that you follow-up with your pharmacy.

## 2023-08-29 NOTE — Telephone Encounter (Signed)
 Duplicate encounter. Third attempt made on other encounter. Please see other Nurse Triage encounter. Pt unable to be contacted at this time. Reason for Disposition . Third attempt to contact caller AND no contact made. Phone number verified.  Protocols used: No Contact or Duplicate Contact Call-A-AH

## 2023-08-30 ENCOUNTER — Other Ambulatory Visit: Payer: Self-pay | Admitting: Internal Medicine

## 2023-08-30 MED ORDER — MAGNESIUM 400 MG PO CAPS: 1.0000 | ORAL_CAPSULE | Freq: Every day | ORAL | 0 refills | Status: DC

## 2023-08-30 NOTE — Telephone Encounter (Signed)
 RX failed to send electronically.

## 2023-08-30 NOTE — Addendum Note (Signed)
 Addended by: Carollynn Cirri on: 08/30/2023 09:43 AM   Modules accepted: Orders

## 2023-08-30 NOTE — Telephone Encounter (Signed)
 Rx called in

## 2023-08-30 NOTE — Telephone Encounter (Signed)
 Her potassium or magnesium  may be low.  She can try magnesium  400 mg at bedtime.  If symptoms persist, would like to see her in the office and check her potassium and magnesium  levels.  She is on HCTZ which is known to cause low potassium which is known to cause leg cramps.  She can try increasing potassium containing foods in her diet such as bananas or green leafy vegetables.

## 2023-08-30 NOTE — Telephone Encounter (Signed)
 [facility]?

## 2023-08-30 NOTE — Telephone Encounter (Signed)
 Copied from CRM [id number]. Topic: Clinical - Medication Refill >> Aug 30, 2023 10:25 AM Stanly Early wrote: Medication: hydrochlorothiazide  (HYDRODIURIL ) 25 MG lisinopril  (ZESTRIL ) 2.5 MG tablet  Has the patient contacted their pharmacy? Yes (Agent: If no, request that the patient contact the pharmacy for the refill. If patient does not wish to contact the pharmacy document the reason why and proceed with request.) (Agent: If yes, when and what did the pharmacy advise?)  This is the patient's preferred pharmacy:  [facility] - [place], [place] - [address] [place] [address] Phone: [phone] Fax: [phone]  Is this the correct pharmacy for this prescription? Yes If no, delete pharmacy and type the correct one.   Has the prescription been filled recently? No  Is the patient out of the medication? Yes 3 left for both medication  Has the patient been seen for an appointment in the last year OR does the patient have an upcoming appointment? Yes  Can we respond through MyChart? Yes  Agent: Please be advised that Rx refills may take up to 3 business days. We ask that you follow-up with your pharmacy.

## 2023-08-31 ENCOUNTER — Ambulatory Visit (INDEPENDENT_AMBULATORY_CARE_PROVIDER_SITE_OTHER): Admitting: Internal Medicine

## 2023-08-31 ENCOUNTER — Telehealth: Payer: Self-pay

## 2023-08-31 ENCOUNTER — Encounter: Payer: Self-pay | Admitting: Internal Medicine

## 2023-08-31 ENCOUNTER — Other Ambulatory Visit: Payer: Self-pay | Admitting: Internal Medicine

## 2023-08-31 ENCOUNTER — Ambulatory Visit: Payer: Self-pay

## 2023-08-31 VITALS — BP 128/78 | Ht 66.0 in | Wt 186.8 lb

## 2023-08-31 DIAGNOSIS — K0889 Other specified disorders of teeth and supporting structures: Secondary | ICD-10-CM | POA: Diagnosis not present

## 2023-08-31 DIAGNOSIS — Z972 Presence of dental prosthetic device (complete) (partial): Secondary | ICD-10-CM

## 2023-08-31 DIAGNOSIS — K121 Other forms of stomatitis: Secondary | ICD-10-CM

## 2023-08-31 DIAGNOSIS — R634 Abnormal weight loss: Secondary | ICD-10-CM

## 2023-08-31 MED ORDER — FAMOTIDINE 40 MG PO TABS
40.0000 mg | ORAL_TABLET | Freq: Every day | ORAL | 1 refills | Status: AC
Start: 2023-08-31 — End: ?

## 2023-08-31 MED ORDER — SIMVASTATIN 20 MG PO TABS: 20.0000 mg | ORAL_TABLET | Freq: Every evening | ORAL | 1 refills | Status: AC

## 2023-08-31 MED ORDER — MAGIC MOUTHWASH W/LIDOCAINE: 5.0000 mL | Freq: Four times a day (QID) | ORAL | 0 refills | Status: DC | PRN

## 2023-08-31 NOTE — Telephone Encounter (Signed)
 Requested Prescriptions  Pending Prescriptions Disp Refills   lisinopril  (ZESTRIL ) 2.5 MG tablet [Pharmacy Med Name: LISINOPRIL  2.5 MG TAB] 90 tablet 1    Sig: TAKE 1 TABLET BY MOUTH DAILY FOR KIDNEY PROTECTION     Cardiovascular:  ACE Inhibitors Passed - 08/31/2023 12:17 PM      Passed - Cr in normal range and within 180 days    Creat  Date Value Ref Range Status  05/08/2023 0.74 0.60 - 1.00 mg/dL Final   Creatinine, Urine  Date Value Ref Range Status  05/08/2023 147 20 - 275 mg/dL Final         Passed - K in normal range and within 180 days    Potassium  Date Value Ref Range Status  05/08/2023 3.7 3.5 - 5.3 mmol/L Final         Passed - Patient is not pregnant      Passed - Last BP in normal range    BP Readings from Last 1 Encounters:  08/31/23 128/78         Passed - Valid encounter within last 6 months    Recent Outpatient Visits           Today Oral ulcer   [place] [facility] [place], Rankin Buzzard, NP   1 week ago BPPV (benign paroxysmal positional vertigo), bilateral   [facility] [place], Kayleen Party, DO   2 weeks ago Benign essential hypertension   [place] [facility] [place], [place], NP   2 months ago Upper back pain on left side   [place] [facility] [place], Rankin Buzzard, NP   2 months ago Poor appetite   [facility] [place], Rankin Buzzard, NP       Future Appointments             In 3 months Baity, Rankin Buzzard, NP [place] [facility]             hydrochlorothiazide  (HYDRODIURIL ) 25 MG tablet [Pharmacy Med Name: HYDROCHLOROTHIAZIDE  25 MG TAB] 90 tablet 1    Sig: TAKE 1 TABLET BY MOUTH DAILY     Cardiovascular: Diuretics - Thiazide Passed - 08/31/2023 12:17 PM      Passed - Cr in normal range and within 180 days    Creat  Date Value Ref Range Status  05/08/2023 0.74 0.60 - 1.00 mg/dL Final   Creatinine, Urine  Date Value  Ref Range Status  05/08/2023 147 20 - 275 mg/dL Final         Passed - K in normal range and within 180 days    Potassium  Date Value Ref Range Status  05/08/2023 3.7 3.5 - 5.3 mmol/L Final         Passed - Na in normal range and within 180 days    Sodium  Date Value Ref Range Status  05/08/2023 142 135 - 146 mmol/L Final         Passed - Last BP in normal range    BP Readings from Last 1 Encounters:  08/31/23 128/78         Passed - Valid encounter within last 6 months    Recent Outpatient Visits           Today Oral ulcer   [place] [facility] [place], Rankin Buzzard, NP   1 week ago BPPV (benign paroxysmal positional vertigo), bilateral  [place] [facility] [place], Kayleen Party, DO   2 weeks ago Benign essential hypertension   [place] [facility] [place], [place], NP   2 months ago Upper back pain on left side   [place] [facility] [place], Rankin Buzzard, NP   2 months ago Poor appetite   [place] [facility] [place], Rankin Buzzard, NP       Future Appointments             In 3 months Baity, Rankin Buzzard, NP [place] [facility], [place]

## 2023-08-31 NOTE — Telephone Encounter (Unsigned)
 Copied from CRM [id number]. Topic: Clinical - Medication Refill >> Aug 31, 2023  3:31 PM Rosaria Common wrote: Medication: magic mouthwash w/lidocaine  SOLN  Has the patient contacted their pharmacy? Yes (Agent: If no, request that the patient contact the pharmacy for the refill. If patient does not wish to contact the pharmacy document the reason why and proceed with request.) (Agent: If yes, when and what did the pharmacy advise?)  This is the patient's preferred pharmacy:  [facility] - [place], [place] - [address] [address] Phone: [phone] Fax: [phone]    Is this the correct pharmacy for this prescription? Yes If no, delete pharmacy and type the correct one.   Has the prescription been filled recently? No  Is the patient out of the medication? Yes  Has the patient been seen for an appointment in the last year OR does the patient have an upcoming appointment? Yes  Can we respond through MyChart? Yes  Agent: Please be advised that Rx refills may take up to 3 business days. We ask that you follow-up with your pharmacy.

## 2023-08-31 NOTE — Patient Instructions (Signed)
 Oral Ulcers Oral ulcers are small sores inside the mouth or near the mouth. They may occur on or inside the lips, inside the cheeks, on the tongue, or anywhere else inside or near the mouth. They may be called canker sores or cold sores, which are two types of oral ulcers. Many oral ulcers are harmless and go away on their own. In some cases, oral ulcers may require medical care to determine the cause and proper treatment. What are the causes? Common causes of this condition include: Infections caused by viruses, bacteria, or fungi. Emotional stress. Foods or chemicals that irritate the mouth. Injury or physical irritation of the mouth. Medicines. Allergies. Tobacco use. Less common causes include: Skin disease. A type of herpes virus infection (herpes simplex or herpes zoster). Oral cancer. In some cases, the cause may not be known. What increases the risk? You are more likely to develop this condition if: You wear dental braces, dentures, or retainers. You do not take good care of your mouth and teeth (poor oral hygiene). You have sensitive skin. You have a condition that affects the entire body (systemic condition), such as an immune disorder. What are the signs or symptoms? The main symptom of this condition is having one or more oval-shaped or round ulcers that have red borders. Symptoms may vary depending on the cause. This includes: Location of the ulcers. Ulcers may be found inside the mouth, on the gums, or on the insides of the lips or cheeks. They may also be found on the lips or on skin that is near the mouth, such as the cheeks or chin. Pain. Ulcers can be painful and uncomfortable, or they can be painless. Appearance of the ulcers. They may look like red blisters and be filled with fluid, or they may be white or yellow patches. Frequency of outbreaks. Ulcers may go away permanently after one outbreak, or they may come back (recur) often or rarely. How is this  diagnosed? This condition is diagnosed with a physical exam. Your health care provider may ask you questions about your lifestyle and your medical history. You may have tests, including: Blood tests. Removal of a small number of cells from an ulcer to be examined under a microscope (biopsy). How is this treated? Treatment depends on the severity and cause of the condition. Oral ulcers often go away on their own in 1-2 weeks. Treatment may include medicines, such as: Medicines to treat a viral infection (antivirals), a bacterial infection (antibiotics), or a fungal infection (antifungals). Medicines to help control pain. This may include: Over-the-counter pain medicines. Gel, cream, or spray to numb the area (topical anesthetic) if you have severe pain. Other medicines to coat or numb your mouth. Follow these instructions at home: Medicines Take or use over-the-counter and prescription medicines only as told by your health care provider. If you were prescribed an antibiotic medicine, take it as told by your health care provider. Do not stop taking the antibiotic even if you start to feel better. Do not use products that contain benzocaine (including numbing gels) to treat teething or mouth pain in children who are younger than 2 years. These products may cause a rare but serious blood condition. Eating and drinking Eat a balanced diet. Do not eat: Spicy foods. Citrus, such as oranges. Other foods and drinks that you think may cause or irritate your ulcers. Drink enough fluid to keep your urine pale yellow. Avoid drinking alcohol. Lifestyle  Practice good oral hygiene: Gently brush your teeth  with a soft toothbrush two times a day. Floss your teeth every day. Get regular dental cleanings and checkups. Do not use any products that contain nicotine or tobacco. These products include cigarettes, chewing tobacco, and vaping devices, such as e-cigarettes. If you need help quitting, ask your  health care provider. Managing pain If directed, put ice on your face in the affected area to help reduce pain. To do this: Put ice in a plastic bag. Place a towel between your skin and the bag. Leave the ice on for 20 minutes, 2-3 times a day. Remove the ice if your skin turns bright red. This is very important. If you cannot feel pain, heat, or cold, you have a greater risk of damage to the area. Avoid physical or chemical irritants that may have caused the ulcers or made them worse, such as mouthwashes that contain alcohol (ethanol). If you wear dental braces, dentures, or retainers, work with your health care provider to make sure these devices are fitted correctly. If you were prescribed a prescription mouthwash to help reduce pain in your mouth, use it as told by your health care provider. General instructions Rinse your mouth with a mixture of salt and water 3-4 times a day or as told by your health care provider. To make salt water, completely dissolve -1 tsp (3-6 g) of salt in 1 cup (237 mL) of warm water. Keep all follow-up visits. This is important. Contact a health care provider if: You have: Pain that gets worse or does not get better with medicine. Four or more ulcers at one time. A fever. New ulcers that look or feel different from other ulcers you have. Inflammation in one eye or both eyes. Ulcers that do not go away after 10 days. You develop new symptoms in your mouth, such as: Bleeding or crusting around your lips or gums. Tooth pain. Difficulty swallowing. You develop symptoms on your skin or genitals, such as: A rash or blisters. Burning or itching sensations. Your ulcers begin or get worse after you start a new medicine. Get help right away if: You have difficulty breathing. You have swelling in your face or neck. You have excessive bleeding from your mouth. You have severe pain. Summary Oral ulcers may occur anywhere inside or near the mouth. They can be  caused by many things, such as infections, stress, injury or irritation, or tobacco use. Oral ulcers can be painful or painless. Treatment may include medicines to relieve pain or to treat an infection (if appropriate). Most oral ulcers go away in 1-2 weeks. This information is not intended to replace advice given to you by your health care provider. Make sure you discuss any questions you have with your health care provider. Document Revised: 01/19/2021 Document Reviewed: 01/19/2021 Elsevier Patient Education  2024 [employer].

## 2023-08-31 NOTE — Telephone Encounter (Signed)
 Patient called, about her mouthwash.  [facility] they stated that the patient informed them she didn't have the funds for the mouthwash. Is there an alternate?

## 2023-08-31 NOTE — Telephone Encounter (Signed)
 Copied from CRM [id number]. Topic: Clinical - Red Word Triage >> Aug 31, 2023  8:07 AM Kita Perish H wrote: [facility] that prompted transfer to Nurse Triage: Knot in upper mouth that's sore and painful  Chief Complaint: mouth pain, lump Symptoms: hard lump in middle of roof of mouth, 6/10 pain today, difficulty eating and swallowing, pain with dentures Frequency: continual Pertinent Negatives: Patient denies fever, mouth sores/ulcers, SOB, injury Disposition: [] 911 / [] ED /[] Urgent Care (no appt availability in office) / [x] Appointment(In office/virtual)/ []  [facility]/ [] Home Care/ [] Refused Recommended Disposition /[] [place] [facility]/ []  Follow-up with PCP Additional Notes: Pt reporting that she first noticed a hard knot in the middle of roof of her mouth this past week, having pain when trying to put denture in even after having denture "sawed down" and difficulty with chewing/eating and swallowing with this. Pt confirms no other symptoms, pt confirms that her leg cramps from 5/7 are not worsening, picking up meds prescribed by doc for this today. Pt reporting she's just wanting to reschedule the appt she made for mouth to this evening, no availability this evening, advised could cancel and have her go to UC, advised still be examined today, pt opted to keep her 10 am appt today. Advised call back if worsening. Pt verbalized understanding.  Reason for Disposition  [1] MILD-MODERATE mouth pain AND [2] present > 3 days  Answer Assessment - Initial Assessment Questions 1. ONSET: "When did the mouth start hurting?" (e.g., hours or days ago)      This past week, any time try to put denture in it hurts, can't hardly chew or eat and trouble swallowing 2. SEVERITY: "How bad is the pain?" (Scale 1-10; mild, moderate or severe)   - MILD (1-3):  doesn't interfere with eating or normal activities   - MODERATE (4-7): interferes with eating some solids and normal activities   - SEVERE (8-10):   excruciating pain, interferes with most normal activities   - SEVERE DYSPHAGIA: can't swallow liquids, drooling     6/10, was worse earlier this week 3. SORES: "Are there any sores or ulcers in the mouth?" If Yes, ask: "What part of the mouth are the sores in?"     no 4. FEVER: "Do you have a fever?" If Yes, ask: "What is your temperature, how was it measured, and when did it start?"     no 6. OTHER SYMPTOMS: "Do you have any other symptoms?" (e.g., difficulty breathing)     Lump in roof of mouth  Protocols used: Mouth Pain-A-AH

## 2023-08-31 NOTE — Progress Notes (Signed)
 Subjective:    Patient ID: Stacy Cunningham, female    DOB: August 04, 1950, 73 y.o.   MRN: [id number]  HPI  Discussed the use of AI scribe software for clinical note transcription with the patient, who gave verbal consent to proceed.  Katalin Craddock is a 73 year old female who presents with oral discomfort and weight loss due to denture-related issues.  She recently underwent dental work, including tooth removal and fitting of a temporary denture. Initially, the denture was positioned too far back, causing breathing difficulties, which required adjustments a few months ago.  Despite these adjustments, she continues to experience irritation and pain, particularly on the roof of her mouth. The area is tender and painful, especially when wearing the denture, leading to significant discomfort while eating.  Due to the pain, she has been unable to eat properly, resulting in a rapid weight loss from over 200 pounds to 186 pounds. She has difficulty chewing and gumming food, which has significantly impacted her nutrition. Eating foods such as rice remains uncomfortable, and she has not been wearing the denture because of the discomfort.     Of note, she would also like a refill on simvastatin  and famotidine  today.  Review of Systems   Past Medical History:  Diagnosis Date   Anemia    Borderline diabetes mellitus    Diabetes mellitus without complication (HCC)    Elevated lipids    GERD (gastroesophageal reflux disease)    Glaucoma    Headache    h/o migraines   Hypertension    Neuropathy    Palpitations    a. 05/2020 Zio: Predominantly sinus rhythm, 80 (60-148).  1 SVT run-4 beats at 148.  Rare PACs and PVCs; b. 04/2021 Zio: Predominantly sinus rhythm, 80 (59-140).  Rare PACs/PVCs.   Panic attack     Current Outpatient Medications  Medication Sig Dispense Refill   aspirin 81 MG EC tablet Take 81 mg by mouth daily.     Blood Glucose Monitoring Suppl (ACCU-CHEK GUIDE ME) w/Device KIT       brimonidine (ALPHAGAN) 0.15 % ophthalmic solution Place 1 drop into both eyes 3 (three) times daily.     butalbital-acetaminophen -caffeine (FIORICET) 50-325-40 MG tablet Take 1 tablet by mouth every 4 (four) hours as needed.     clobetasol ointment (TEMOVATE) 0.05 % Apply 1 Application topically.     clotrimazole -betamethasone  (LOTRISONE ) cream APPLY TOPICALLY TO AFFECTED AREAS 2 TIMES DAILY 45 g 0   dorzolamide-timolol  (COSOPT) 22.3-6.8 MG/ML ophthalmic solution Place 1 drop into both eyes at bedtime.     famotidine  (PEPCID ) 40 MG tablet Take 1 tablet (40 mg total) by mouth daily. 90 tablet 2   ferrous sulfate  325 (65 FE) MG tablet Take 1 tablet (325 mg total) by mouth 2 (two) times daily with a meal. 180 tablet 1   fexofenadine  (ALLEGRA  ALLERGY) 180 MG tablet Take 1 tablet (180 mg total) by mouth daily. 90 tablet 0   fluticasone  (FLONASE ) 50 MCG/ACT nasal spray Place 2 sprays into both nostrils daily. Use for 4-6 weeks then stop and use seasonally or as needed. 16 g 3   gabapentin  (NEURONTIN ) 800 MG tablet Take one tablet AM, one PM, two QHS 360 tablet 3   hydrochlorothiazide  (HYDRODIURIL ) 25 MG tablet TAKE 1 TABLET BY MOUTH DAILY 90 tablet 1   lidocaine  (LIDODERM ) 5 % Place 1 patch onto the skin daily. Remove & Discard patch within 12 hours or as directed by MD 90 patch 1   lisinopril  (  ZESTRIL ) 2.5 MG tablet TAKE 1 TABLET BY MOUTH DAILY FOR KIDNEY PROTECTION 90 tablet 1   Magnesium  400 MG CAPS Take 1 capsule by mouth daily at 12 noon. 30 capsule 0   metFORMIN  (GLUCOPHAGE ) 500 MG tablet TAKE 2 TABLETS BY MOUTH DAILY WITH BREAKFAST AND 1 TABLET IN THE EVENING 270 tablet 0   nortriptyline (PAMELOR) 10 MG capsule Take 10 mg by mouth 2 (two) times daily.     omeprazole  (PRILOSEC) 20 MG capsule Take 1 capsule (20 mg total) by mouth daily. 90 capsule 1   propranolol  ER (INDERAL  LA) 60 MG 24 hr capsule Take 1 capsule (60 mg total) by mouth daily. 90 capsule 0   ROCKLATAN 0.02-0.005 % SOLN Apply to eye.      simvastatin  (ZOCOR ) 20 MG tablet TAKE 1 TABLET BY MOUTH EVERY EVENING 90 tablet 2   No current facility-administered medications for this visit.    Allergies  Allergen Reactions   Sulfa Antibiotics Dermatitis   Darvon [Propoxyphene] Hives   Other Other (See Comments) and Rash    PAPER TAPE OK TO USE PAPER TAPE OK TO USE    Tape Rash    PAPER TAPE OK TO USE    Family History  Problem Relation Age of Onset   Breast cancer Sister 29   Heart attack Mother 49   Hyperlipidemia Mother    Hypertension Mother     Social History   Socioeconomic History   Marital status: Married    Spouse name: Not on file   Number of children: Not on file   Years of education: Not on file   Highest education level: Not on file  Occupational History   Not on file  Tobacco Use   Smoking status: Never   Smokeless tobacco: Never  Vaping Use   Vaping status: Never Used  Substance and Sexual Activity   Alcohol use: Not Currently    Comment: rare   Drug use: No   Sexual activity: Yes  Other Topics Concern   Not on file  Social History Narrative   Not on file   Social Drivers of Health   Financial Resource Strain: High Risk (05/30/2023)   [facility]   Overall Financial Resource Strain (CARDIA)    Difficulty of Paying Living Expenses: Hard  Food Insecurity: Food Insecurity Present (05/30/2023)   [facility]   Hunger Vital Sign    Worried About Running Out of Food in the Last Year: Sometimes true    Ran Out of Food in the Last Year: Sometimes true  Transportation Needs: No Transportation Needs (05/30/2023)   [facility] - [facility]    In the past 12 months, has lack of transportation kept you from medical appointments or from getting medications?: No    Lack of Transportation (Non-Medical): No  Physical Activity: Insufficiently Active (09/29/2022)   Exercise Vital Sign    Days of  Exercise per Week: 3 days    Minutes of Exercise per Session: 10 min  Stress: No Stress Concern Present (09/29/2022)   [employer] of Occupational Health - Occupational Stress Questionnaire    Feeling of Stress : Not at all  Social Connections: Moderately Integrated (09/29/2022)   Social Connection and Isolation Panel [NHANES]    Frequency of Communication with Friends and Family: More than three times a week    Frequency of Social Gatherings with Friends and Family: More than three  times a week    Attends Religious Services: More than 4 times per year    Active Member of Clubs or Organizations: No    Attends Banker Meetings: Never    Marital Status: Married  Catering manager Violence: Not At Risk (09/29/2022)   Humiliation, Afraid, Rape, and Kick questionnaire    Fear of Current or Ex-Partner: No    Emotionally Abused: No    Physically Abused: No    Sexually Abused: No     Constitutional: Patient reports unintentional weight loss.  Denies fever, malaise, fatigue, headache.  HEENT: Patient reports skin lesion in mouth.  Denies eye pain, eye redness, ear pain, ringing in the ears, wax buildup, runny nose, nasal congestion, bloody nose, or sore throat. Respiratory: Denies difficulty breathing, shortness of breath, cough or sputum production.   Cardiovascular: Denies chest pain, chest tightness, palpitations or swelling in the hands or feet.   No other specific complaints in a complete review of systems (except as listed in HPI above).      Objective:   Physical Exam  BP 128/78 (BP Location: Left Arm, Patient Position: Sitting, Cuff Size: Normal)   Ht 5\' 6"  (1.676 m)   Wt 186 lb 12.8 oz (84.7 kg)   BMI 30.15 kg/m   Wt Readings from Last 3 Encounters:  08/24/23 185 lb 6 oz (84.1 kg)  08/14/23 186 lb (84.4 kg)  06/15/23 196 lb 9.6 oz (89.2 kg)    General: Appears her stated age, obese, in NAD. HEENT: Head: normal shape and size; E Throat/Mouth: Teeth missing,  mucosa pink and moist, oral ulceration noted at roof of mouth.  Cardiovascular: Normal rate and rhythm.  Pulmonary/Chest: Normal effort and positive vesicular breath sounds. No respiratory distress. No wheezes, rales or ronchi noted.  Neurological: Alert and oriented.   BMET    Component Value Date/Time   NA 142 05/08/2023 1123   K 3.7 05/08/2023 1123   CL 106 05/08/2023 1123   CO2 30 05/08/2023 1123   GLUCOSE 142 (H) 05/08/2023 1123   BUN 7 05/08/2023 1123   CREATININE 0.74 05/08/2023 1123   CALCIUM 9.5 05/08/2023 1123   GFRNONAA >60 09/18/2018 1030   GFRAA >60 09/18/2018 1030    Lipid Panel     Component Value Date/Time   CHOL 135 05/08/2023 1123   TRIG 103 05/08/2023 1123   HDL 51 05/08/2023 1123   CHOLHDL 2.6 05/08/2023 1123   LDLCALC 65 05/08/2023 1123    CBC    Component Value Date/Time   WBC 7.0 05/08/2023 1123   RBC 4.94 05/08/2023 1123   HGB 10.8 (L) 05/08/2023 1123   HCT 35.7 05/08/2023 1123   PLT 272 05/08/2023 1123   MCV 72.3 (L) 05/08/2023 1123   MCH 21.9 (L) 05/08/2023 1123   MCHC 30.3 (L) 05/08/2023 1123   RDW 16.2 (H) 05/08/2023 1123   LYMPHSABS 2.4 06/05/2018 0608   MONOABS 0.5 06/05/2018 0608   EOSABS 0.1 06/05/2018 0608   BASOSABS 0.1 06/05/2018 0608    Hgb A1C Lab Results  Component Value Date   HGBA1C 6.8 (A) 05/08/2023            Assessment & Plan:  Assessment and Plan    Oral ulcer 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 Viral etiology considered but less likely. - Prescribed Magic Mouthwash qid for 5 days. - Advised against denture use until ulcer heals. - Recommend dental follow-up if ulcer persists or recurs.  Weight loss Weight loss  from 200 lbs to 186 lbs due to oral ulcer pain affecting eating. - Recommend protein shakes like Boost to maintain nutrition and prevent further weight loss.        RTC in 3 months for your annual exam Helayne Lo, NP

## 2023-09-01 NOTE — Telephone Encounter (Signed)
 Salt water gargles would be what I would recommend

## 2023-09-03 ENCOUNTER — Other Ambulatory Visit: Payer: Self-pay | Admitting: Internal Medicine

## 2023-09-03 NOTE — Telephone Encounter (Signed)
 Left message for patient to use salt water gurgles.

## 2023-09-03 NOTE — Telephone Encounter (Signed)
 Duplicate request.  Requested Prescriptions  Pending Prescriptions Disp Refills   magic mouthwash w/lidocaine  SOLN 120 mL 0    Sig: Take 5 mLs by mouth 4 (four) times daily as needed for mouth pain.     Off-Protocol Failed - 09/03/2023  3:57 PM      Failed - Medication not assigned to a protocol, review manually.      Passed - Valid encounter within last 12 months    Recent Outpatient Visits           3 days ago Oral ulcer   [place] [facility] [place], [place] W, NP   1 week ago BPPV (benign paroxysmal positional vertigo), bilateral   [place] [place], Kayleen Party, DO   2 weeks ago Benign essential hypertension   [place] [facility] [place], [place] W, NP   2 months ago Upper back pain on left side   [place] [facility] [place], Rankin Buzzard, NP   2 months ago Poor appetite   [employer] [facility] [place], Rankin Buzzard, NP       Future Appointments             In 3 months Baity, Rankin Buzzard, NP [place] [facility], [facility]           Gastroenterology:  Laxatives Passed - 09/03/2023  3:57 PM      Passed - Valid encounter within last 12 months    Recent Outpatient Visits           3 days ago Oral ulcer   [place] [facility] [place], [place] W, NP   1 week ago BPPV (benign paroxysmal positional vertigo), bilateral   [facility] [place], Kayleen Party, DO   2 weeks ago Benign essential hypertension   [facility] [place], [place] W, NP   2 months ago Upper back pain on left side   [place] [facility] [place], Rankin Buzzard, NP   2 months ago Poor appetite   [place] [facility] [place], Rankin Buzzard, NP       Future Appointments             In 3 months Baity, Rankin Buzzard, NP [place] [facility], [facility]           Over the Counter:  OTC Passed -  09/03/2023  3:57 PM      Passed - Valid encounter within last 12 months    Recent Outpatient Visits           3 days ago Oral ulcer   [place] [facility] [place], [place] W, NP   1 week ago BPPV (benign paroxysmal positional vertigo), bilateral   [place] [facility] [place], Kayleen Party, DO   2 weeks ago Benign essential hypertension   [place] [place], [place], NP   2 months ago Upper back pain on left side   [place] [facility] [place], Rankin Buzzard, NP   2 months ago Poor appetite   [place] [facility] [place], Rankin Buzzard, NP       Future Appointments             In 3 months Baity, Rankin Buzzard, NP  [facility], [facility]

## 2023-09-03 NOTE — Telephone Encounter (Signed)
 Requested Prescriptions  Refused Prescriptions Disp Refills   lisinopril  (ZESTRIL ) 2.5 MG tablet 90 tablet 1     Cardiovascular:  ACE Inhibitors Passed - 09/03/2023  8:00 AM      Passed - Cr in normal range and within 180 days    Creat  Date Value Ref Range Status  05/08/2023 0.74 0.60 - 1.00 mg/dL Final   Creatinine, Urine  Date Value Ref Range Status  05/08/2023 147 20 - 275 mg/dL Final         Passed - K in normal range and within 180 days    Potassium  Date Value Ref Range Status  05/08/2023 3.7 3.5 - 5.3 mmol/L Final         Passed - Patient is not pregnant      Passed - Last BP in normal range    BP Readings from Last 1 Encounters:  08/31/23 128/78         Passed - Valid encounter within last 6 months    Recent Outpatient Visits           3 days ago Oral ulcer   [place] [facility] [place], [place] W, NP   1 week ago BPPV (benign paroxysmal positional vertigo), bilateral   [place] [facility] [place], Kayleen Party, DO   2 weeks ago Benign essential hypertension   [facility] [place], [place] W, NP   2 months ago Upper back pain on left side   [place] [facility] [place], Rankin Buzzard, NP   2 months ago Poor appetite   [place] [facility] [place], Rankin Buzzard, NP       Future Appointments             In 3 months [place], Rankin Buzzard, NP [place] [facility], [facility]             hydrochlorothiazide  (HYDRODIURIL ) 25 MG tablet 90 tablet 1    Sig: Take 1 tablet (25 mg total) by mouth daily.     Cardiovascular: Diuretics - Thiazide Passed - 09/03/2023  8:00 AM      Passed - Cr in normal range and within 180 days    Creat  Date Value Ref Range Status  05/08/2023 0.74 0.60 - 1.00 mg/dL Final   Creatinine, Urine  Date Value Ref Range Status  05/08/2023 147 20 - 275 mg/dL Final         Passed - K in normal range and within 180 days    Potassium   Date Value Ref Range Status  05/08/2023 3.7 3.5 - 5.3 mmol/L Final         Passed - Na in normal range and within 180 days    Sodium  Date Value Ref Range Status  05/08/2023 142 135 - 146 mmol/L Final         Passed - Last BP in normal range    BP Readings from Last 1 Encounters:  08/31/23 128/78         Passed - Valid encounter within last 6 months    Recent Outpatient Visits           3 days ago Oral ulcer   [place] [facility] [place], Rankin Buzzard, NP   1 week ago BPPV (benign paroxysmal positional vertigo), bilateral   [place] [facility] [place], Kayleen Party, DO   2 weeks ago Benign  essential hypertension   [place] [facility] [place], [place] W, NP   2 months ago Upper back pain on left side   [place] [facility] [place], Rankin Buzzard, NP   2 months ago Poor appetite   [place] [facility] [place], Rankin Buzzard, NP       Future Appointments             In 3 months Baity, Rankin Buzzard, NP [place] [facility], [facility]

## 2023-09-03 NOTE — Telephone Encounter (Signed)
 Requested medication (s) are due for refill today: yes  Requested medication (s) are on the active medication list: yes  Last refill:  06/10/22  Future visit scheduled: yes  Notes to clinic:  Medication not assigned to a protocol, review manually.      Requested Prescriptions  Pending Prescriptions Disp Refills   clotrimazole -betamethasone  (LOTRISONE ) cream [Pharmacy Med Name: CLOTRIMAZOLE -BETAMETHASONE  1-0.05%] 45 g 0    Sig: APPLY TOPICALLY TO AFFECTED AREAS 2 TIMES DAILY     Off-Protocol Failed - 09/03/2023  8:29 AM      Failed - Medication not assigned to a protocol, review manually.      Passed - Valid encounter within last 12 months    Recent Outpatient Visits           3 days ago Oral ulcer   [place] [facility] [place], [place] W, NP   1 week ago BPPV (benign paroxysmal positional vertigo), bilateral   [place] [facility] [place], Kayleen Party, DO   2 weeks ago Benign essential hypertension   [place] [facility] [place], [place] W, NP   2 months ago Upper back pain on left side   [place] [facility] [place], Rankin Buzzard, NP   2 months ago Poor appetite   [place] [facility] [place], Rankin Buzzard, NP       Future Appointments             In 3 months Baity, Rankin Buzzard, NP [place] [facility], [facility]

## 2023-09-04 NOTE — Telephone Encounter (Unsigned)
 Copied from CRM [id number]. Topic: Clinical - Medication Refill >> Sep 04, 2023  3:01 PM Ja-Kwan M wrote: Medication: metFORMIN  (GLUCOPHAGE ) 500 MG tablet  Has the patient contacted their pharmacy? Yes  This is the patient's preferred pharmacy:  [facility] - [place], [place] - [address] Je[place]en Mike [place] [address] Phone: [phone] Fax: [phone]  Is this the correct pharmacy for this prescription? Yes If no, delete pharmacy and type the correct one.   Has the prescription been filled recently? No  Is the patient out of the medication? Yes  Has the patient been seen for an appointment in the last year OR does the patient have an upcoming appointment? Yes  Can we respond through MyChart? No  Agent: Please be advised that Rx refills may take up to 3 business days. We ask that you follow-up with your pharmacy.

## 2023-09-05 NOTE — Telephone Encounter (Signed)
 Labs in date except no B12.   Requested Prescriptions  Pending Prescriptions Disp Refills   metFORMIN  (GLUCOPHAGE ) 500 MG tablet [Pharmacy Med Name: METFORMIN  HCL 500 MG TAB] 270 tablet 0    Sig: TAKE 2 TABLETS BY MOUTH DAILY WITH BREAKFAST AND 1 TABLET IN THE EVENING     Endocrinology:  Diabetes - Biguanides Failed - 09/05/2023 11:40 AM      Failed - B12 Level in normal range and within 720 days    No results found for: "VITAMINB12"       Failed - CBC within normal limits and completed in the last 12 months    WBC  Date Value Ref Range Status  05/08/2023 7.0 3.8 - 10.8 Thousand/uL Final   RBC  Date Value Ref Range Status  05/08/2023 4.94 3.80 - 5.10 Million/uL Final   Hemoglobin  Date Value Ref Range Status  05/08/2023 10.8 (L) 11.7 - 15.5 g/dL Final   HCT  Date Value Ref Range Status  05/08/2023 35.7 35.0 - 45.0 % Final   MCHC  Date Value Ref Range Status  05/08/2023 30.3 (L) 32.0 - 36.0 g/dL Final    Comment:    For adults, a slight decrease in the calculated MCHC value (in the range of 30 to 32 g/dL) is most likely not clinically significant; however, it should be interpreted with caution in correlation with other red cell parameters and the patient's clinical condition.    [facility]  Date Value Ref Range Status  05/08/2023 21.9 (L) 27.0 - 33.0 pg Final   MCV  Date Value Ref Range Status  05/08/2023 72.3 (L) 80.0 - 100.0 fL Final   No results found for: "PLTCOUNTKUC", "LABPLAT", "POCPLA" RDW  Date Value Ref Range Status  05/08/2023 16.2 (H) 11.0 - 15.0 % Final         Passed - Cr in normal range and within 360 days    Creat  Date Value Ref Range Status  05/08/2023 0.74 0.60 - 1.00 mg/dL Final   Creatinine, Urine  Date Value Ref Range Status  05/08/2023 147 20 - 275 mg/dL Final         Passed - HBA1C is between 0 and 7.9 and within 180 days    Hemoglobin A1C  Date Value Ref Range Status  05/08/2023 6.8 (A) 4.0 - 5.6 % Final   HbA1c, POC (controlled  diabetic range)  Date Value Ref Range Status  12/07/2022 7.4 (A) 0.0 - 7.0 % Final         Passed - eGFR in normal range and within 360 days    GFR calc Af Amer  Date Value Ref Range Status  09/18/2018 >60 >60 mL/min Final   GFR calc non Af Amer  Date Value Ref Range Status  09/18/2018 >60 >60 mL/min Final   eGFR  Date Value Ref Range Status  05/08/2023 87 > OR = 60 mL/min/1.23m2 Final         Passed - Valid encounter within last 6 months    Recent Outpatient Visits           5 days ago Oral ulcer   [place] [facility] [place], Rankin Buzzard, NP   1 week ago BPPV (benign paroxysmal positional vertigo), bilateral   [facility] [place], DO   3 weeks ago Benign essential hypertension    [facility] [place], [place] W, NP   2 months ago Upper back pain on  left side   [facility] [place], Rankin Buzzard, NP   3 months ago Poor appetite   [place] [facility] [place], Rankin Buzzard, NP       Future Appointments             In 3 months Baity, Rankin Buzzard, NP [place] [facility], [place]

## 2023-09-07 ENCOUNTER — Telehealth: Payer: Self-pay

## 2023-09-07 ENCOUNTER — Ambulatory Visit (INDEPENDENT_AMBULATORY_CARE_PROVIDER_SITE_OTHER)

## 2023-09-07 VITALS — BP 128/78 | Ht 66.0 in | Wt 187.0 lb

## 2023-09-07 DIAGNOSIS — Z0000 Encounter for general adult medical examination without abnormal findings: Secondary | ICD-10-CM | POA: Diagnosis not present

## 2023-09-07 DIAGNOSIS — Z2821 Immunization not carried out because of patient refusal: Secondary | ICD-10-CM

## 2023-09-07 NOTE — Telephone Encounter (Signed)
 Copied from CRM [id number]. Topic: General - Other >> Sep 07, 2023 10:10 AM Stacy Cunningham wrote: Reason for CRM: Patient is calling in because she wants to know if she has had her Pneumonia shot. Patient says she knows she hasn't had her shingles or whooping cough, but is unsure if she has had the pneumonia. Patient is requesting a call back.

## 2023-09-07 NOTE — Telephone Encounter (Signed)
 Spoke with patient and advised on vaccines and dates. No further action needed at this time.

## 2023-09-07 NOTE — Patient Instructions (Signed)
 Ms. Stacy Cunningham , Thank you for taking time out of your busy schedule to complete your Annual Wellness Visit with me. I enjoyed our conversation and look forward to speaking with you again next year. I, as well as your care team,  appreciate your ongoing commitment to your health goals. Please review the following plan we discussed and let me know if I can assist you in the future. Your Game plan/ To Do List    Referrals: If you haven't heard from the office you've been referred to, please reach out to them at the phone provided.   Follow up Visits: Next Medicare AWV with our clinical staff: 09/19/2024   Have you seen your provider in the last 6 months (3 months if uncontrolled diabetes)? Yes Next Office Visit with your provider: 0818/2025  Clinician Recommendations:  Aim for 30 minutes of exercise or brisk walking, 6-8 glasses of water, and 5 servings of fruits and vegetables each day.       This is a list of the screening recommended for you and due dates:  Health Maintenance  Topic Date Due   Zoster (Shingles) Vaccine (1 of 2) Never done   Hemoglobin A1C  11/05/2023   Flu Shot  11/23/2023   Complete foot exam   12/07/2023   Eye exam for diabetics  05/03/2024   Yearly kidney function blood test for diabetes  05/07/2024   Yearly kidney health urinalysis for diabetes  05/07/2024   Medicare Annual Wellness Visit  09/06/2024   Mammogram  12/03/2024   DTaP/Tdap/Td vaccine (3 - Td or Tdap) 12/10/2028   Colon Cancer Screening  06/19/2032   Pneumonia Vaccine  Completed   DEXA scan (bone density measurement)  Completed   Hepatitis C Screening  Completed   HPV Vaccine  Aged Out   Meningitis B Vaccine  Aged Out   COVID-19 Vaccine  Discontinued    Advanced directives: (Declined) Advance directive discussed with you today. Even though you declined this today, please call our office should you change your mind, and we can give you the proper paperwork for you to fill out. Advance Care Planning is  important because it:  [x]  Makes sure you receive the medical care that is consistent with your values, goals, and preferences  [x]  It provides guidance to your family and loved ones and reduces their decisional burden about whether or not they are making the right decisions based on your wishes.  Follow the link provided in your after visit summary or read over the paperwork we have mailed to you to help you started getting your Advance Directives in place. If you need assistance in completing these, please reach out to us  so that we can help you!  See attachments for Preventive Care and Fall Prevention Tips.

## 2023-09-07 NOTE — Progress Notes (Signed)
 Because this visit was a virtual/telehealth visit,  certain criteria was not obtained, such a blood pressure, CBG if applicable, and timed get up and go. Any medications not marked as "taking" were not mentioned during the medication reconciliation part of the visit. Any vitals not documented were not able to be obtained due to this being a telehealth visit or patient was unable to self-report a recent blood pressure reading due to a lack of equipment at home via telehealth. Vitals that have been documented are verbally provided by the patient.   This visit was performed by a medical professional under my direct supervision. I was immediately available for consultation/collaboration. I have reviewed and agree with the Annual Wellness Visit documentation.  Subjective:   Stacy Cunningham is a 73 y.o. who presents for a Medicare Wellness preventive visit.  As a reminder, Annual Wellness Visits don't include a physical exam, and some assessments may be limited, especially if this visit is performed virtually. We may recommend an in-person follow-up visit with your provider if needed.  Visit Complete: Virtual I connected with  Stacy Cunningham on 09/07/23 by a audio enabled telemedicine application and verified that I am speaking with the correct person using two identifiers.  Patient Location: Home  Provider Location: Home Office  I discussed the limitations of evaluation and management by telemedicine. The patient expressed understanding and agreed to proceed.  Vital Signs: Because this visit was a virtual/telehealth visit, some criteria may be missing or patient reported. Any vitals not documented were not able to be obtained and vitals that have been documented are patient reported.  VideoDeclined- This patient declined Librarian, academic. Therefore the visit was completed with audio only.  Persons Participating in Visit: Patient.  AWV Questionnaire: No: Patient Medicare  AWV questionnaire was not completed prior to this visit.  Cardiac Risk Factors include: advanced age (>66men, >27 women);obesity (BMI >30kg/m2);diabetes mellitus;dyslipidemia;hypertension     Objective:     Today's Vitals   09/07/23 1145  BP: 128/78  Weight: 187 lb (84.8 kg)  Height: 5\' 6"  (1.676 m)   Body mass index is 30.18 kg/m.     09/07/2023   11:45 AM 09/29/2022    2:01 PM 06/19/2022    8:28 AM 02/15/2022    6:57 AM 11/24/2020   12:41 PM 11/09/2020    1:50 PM 09/30/2018   10:33 AM  Advanced Directives  Does Patient Have a Medical Advance Directive? No No No No No No No  Would patient like information on creating a medical advance directive? No - Patient declined No - Patient declined  No - Patient declined  No - Patient declined No - Patient declined    Current Medications (verified) Outpatient Encounter Medications as of 09/07/2023  Medication Sig   aspirin 81 MG EC tablet Take 81 mg by mouth daily.   Blood Glucose Monitoring Suppl (ACCU-CHEK GUIDE ME) w/Device KIT    brimonidine (ALPHAGAN) 0.15 % ophthalmic solution Place 1 drop into both eyes 3 (three) times daily.   butalbital-acetaminophen -caffeine (FIORICET) 50-325-40 MG tablet Take 1 tablet by mouth every 4 (four) hours as needed.   clobetasol ointment (TEMOVATE) 0.05 % Apply 1 Application topically.   clotrimazole -betamethasone  (LOTRISONE ) cream APPLY TOPICALLY TO AFFECTED AREAS 2 TIMES DAILY   dorzolamide-timolol  (COSOPT) 22.3-6.8 MG/ML ophthalmic solution Place 1 drop into both eyes at bedtime.   famotidine  (PEPCID ) 40 MG tablet Take 1 tablet (40 mg total) by mouth daily.   ferrous sulfate  325 (65 FE) MG tablet  Take 1 tablet (325 mg total) by mouth 2 (two) times daily with a meal.   fexofenadine  (ALLEGRA  ALLERGY) 180 MG tablet Take 1 tablet (180 mg total) by mouth daily.   fluticasone  (FLONASE ) 50 MCG/ACT nasal spray Place 2 sprays into both nostrils daily. Use for 4-6 weeks then stop and use seasonally or as needed.    gabapentin  (NEURONTIN ) 800 MG tablet Take one tablet AM, one PM, two QHS   hydrochlorothiazide  (HYDRODIURIL ) 25 MG tablet TAKE 1 TABLET BY MOUTH DAILY   lidocaine  (LIDODERM ) 5 % Place 1 patch onto the skin daily. Remove & Discard patch within 12 hours or as directed by MD   lisinopril  (ZESTRIL ) 2.5 MG tablet TAKE 1 TABLET BY MOUTH DAILY FOR KIDNEY PROTECTION   magic mouthwash w/lidocaine  SOLN Take 5 mLs by mouth 4 (four) times daily as needed for mouth pain.   Magnesium  400 MG CAPS Take 1 capsule by mouth daily at 12 noon.   metFORMIN  (GLUCOPHAGE ) 500 MG tablet TAKE 2 TABLETS BY MOUTH DAILY WITH BREAKFAST AND 1 TABLET IN THE EVENING   nortriptyline (PAMELOR) 10 MG capsule Take 10 mg by mouth 2 (two) times daily.   omeprazole  (PRILOSEC) 20 MG capsule Take 1 capsule (20 mg total) by mouth daily.   propranolol  ER (INDERAL  LA) 60 MG 24 hr capsule Take 1 capsule (60 mg total) by mouth daily.   ROCKLATAN 0.02-0.005 % SOLN Apply to eye.   simvastatin  (ZOCOR ) 20 MG tablet Take 1 tablet (20 mg total) by mouth every evening.   No facility-administered encounter medications on file as of 09/07/2023.    Allergies (verified) Sulfa antibiotics, Darvon [propoxyphene], Other, and Tape   History: Past Medical History:  Diagnosis Date   Anemia    Borderline diabetes mellitus    Diabetes mellitus without complication (HCC)    Elevated lipids    GERD (gastroesophageal reflux disease)    Glaucoma    Headache    h/o migraines   Hypertension    Neuropathy    Palpitations    a. 05/2020 Zio: Predominantly sinus rhythm, 80 (60-148).  1 SVT run-4 beats at 148.  Rare PACs and PVCs; b. 04/2021 Zio: Predominantly sinus rhythm, 80 (59-140).  Rare PACs/PVCs.   Panic attack    Past Surgical History:  Procedure Laterality Date   ANTERIOR CERVICAL DECOMP/DISCECTOMY FUSION N/A 09/30/2018   Procedure: ANTERIOR CERVICAL DECOMPRESSION/DISCECTOMY FUSION 1 LEVEL - C 5/6;  Surgeon: Berta Brittle, MD;  Location: [facility];   Service: Neurosurgery;  Laterality: N/A;   CATARACT EXTRACTION W/PHACO Right 02/15/2022   Procedure: CATARACT EXTRACTION PHACO AND INTRAOCULAR LENS PLACEMENT (IOC) RIGHT KAHOOK DUAL BLADE GONIOTOMY DIABETIC 6.21 01:05.9;  Surgeon: Annell Kidney, MD;  Location[facility];  Service: Ophthalmology;  Laterality: Right;   CHOLECYSTECTOMY N/A 02/06/2018   Procedure: LAPAROSCOPIC CHOLECYSTECTOMY;  Surgeon: Eldred Grego, MD;  Location: [facility];  Service: General;  Laterality: N/A;   COLONOSCOPY WITH PROPOFOL  N/A 07/13/2017   Procedure: COLONOSCOPY WITH PROPOFOL ;  Surgeon: Irby Mannan, MD;  Location: [facility];  Service: Endoscopy;  Laterality: N/A;   COLONOSCOPY WITH PROPOFOL  N/A 06/19/2022   Procedure: COLONOSCOPY WITH PROPOFOL ;  Surgeon: Luke Salaam, MD;  Location: [facility];  Service: Gastroenterology;  Laterality: N/A;   ESOPHAGOGASTRODUODENOSCOPY (EGD) WITH PROPOFOL  N/A 09/14/2017   Procedure: ESOPHAGOGASTRODUODENOSCOPY (EGD) WITH PROPOFOL ;  Surgeon: Irby Mannan, MD;  Location: [facility];  Service: Endoscopy;  Laterality: N/A;   Family History  Problem Relation Age of Onset   Breast  cancer Sister 24   Heart attack Mother 12   Hyperlipidemia Mother    Hypertension Mother    Social History   Socioeconomic History   Marital status: Married    Spouse name: Not on file   Number of children: Not on file   Years of education: Not on file   Highest education level: Not on file  Occupational History   Not on file  Tobacco Use   Smoking status: Never   Smokeless tobacco: Never  Vaping Use   Vaping status: Never Used  Substance and Sexual Activity   Alcohol use: Not Currently    Comment: rare   Drug use: No   Sexual activity: Yes  Other Topics Concern   Not on file  Social History Narrative   Not on file   Social Drivers of Health   Financial Resource Strain: Low Risk  (09/07/2023)   Overall Financial Resource Strain (CARDIA)     Difficulty of Paying Living Expenses: Not hard at all  Food Insecurity: No Food Insecurity (09/07/2023)   Hunger Vital Sign    Worried About Running Out of Food in the Last Year: Never true    Ran Out of Food in the Last Year: Never true  Transportation Needs: No Transportation Needs (09/07/2023)   PRAPARE - Administrator, Civil Service (Medical): No    Lack of Transportation (Non-Medical): No  Physical Activity: Insufficiently Active (09/07/2023)   Exercise Vital Sign    Days of Exercise per Week: 7 days    Minutes of Exercise per Session: 20 min  Stress: No Stress Concern Present (09/07/2023)   [employer] of Occupational Health - Occupational Stress Questionnaire    Feeling of Stress : Not at all  Social Connections: Moderately Isolated (09/07/2023)   Social Connection and Isolation Panel [NHANES]    Frequency of Communication with Friends and Family: More than three times a week    Frequency of Social Gatherings with Friends and Family: More than three times a week    Attends Religious Services: More than 4 times per year    Active Member of [employer] or Organizations: No    Attends Banker Meetings: Never    Marital Status: Separated    Tobacco Counseling Counseling given: Not Answered    Clinical Intake:  Pre-visit preparation completed: Yes        BMI - recorded: 30.18 Nutritional Status: BMI > 30  Obese Nutritional Risks: None Diabetes: Yes CBG done?: No Did pt. bring in CBG monitor from home?: No  Lab Results  Component Value Date   HGBA1C 6.8 (A) 05/08/2023   HGBA1C 7.4 (A) 12/07/2022   HGBA1C 7.0 (H) 05/08/2022     How often do you need to have someone help you when you read instructions, pamphlets, or other written materials from your doctor or pharmacy?: 1 - Never  Interpreter Needed?: No  Information entered by :: Juliann Ochoa   Activities of Daily Living     09/07/2023   11:49 AM 12/07/2022    9:16 AM  In  your present state of health, do you have any difficulty performing the following activities:  Hearing? 0 0  Vision? 0 1  Difficulty concentrating or making decisions? 0 1  Walking or climbing stairs? 0 1  Dressing or bathing? 1 0  Comment patient has a aid   Doing errands, shopping? 0 0  Preparing Food and eating ? Y   Using the Toilet? N  In the past six months, have you accidently leaked urine? N   Do you have problems with loss of bowel control? N   Managing your Medications? N   Managing your Finances? N   Housekeeping or managing your Housekeeping? Y   Comment patient has a aid     Patient Care Team: Carollynn Cirri, NP as PCP - General (Internal Medicine) Constancia Delton, MD as PCP - Cardiology (Cardiology)  Indicate any recent Medical Services you may have received from other than [facility] providers in the past year (date may be approximate).     Assessment:    This is a routine wellness examination for Stacy Cunningham.  Hearing/Vision screen Hearing Screening - Comments:: No hearing aids Vision Screening - Comments:: Wears glasses    Goals Addressed               This Visit's Progress     Patient Stated (pt-stated)        Patient would like to get GED       Depression Screen     09/07/2023   11:51 AM 08/31/2023   10:10 AM 08/24/2023    3:22 PM 08/14/2023    1:59 PM 12/07/2022    9:15 AM 09/29/2022    2:00 PM 07/26/2022   11:36 AM  PHQ 2/9 Scores  PHQ - 2 Score 0 1 0 1 1 0 0  PHQ- 9 Score 1 3 4 6   0 0    Fall Risk     09/07/2023   11:48 AM 08/31/2023   10:10 AM 08/24/2023    3:22 PM 08/14/2023    1:59 PM 12/07/2022    9:15 AM  Fall Risk   Falls in the past year? 0 0 0 0 0  Number falls in past yr: 0 0  1   Injury with Fall? 0 0 0 0 0  Risk for fall due to : No Fall Risks    No Fall Risks  Follow up Falls evaluation completed        MEDICARE RISK AT HOME:  Medicare Risk at Home Any stairs in or around the home?: No If so, are there any without handrails?:  No Home free of loose throw rugs in walkways, pet beds, electrical cords, etc?: Yes Adequate lighting in your home to reduce risk of falls?: Yes Life alert?: Yes Use of a cane, walker or w/c?: Yes (cane) Grab bars in the bathroom?: No Shower chair or bench in shower?: Yes Elevated toilet seat or a handicapped toilet?: Yes  TIMED UP AND GO:  Was the test performed?  no  Cognitive Function: 6CIT completed        09/07/2023   11:48 AM 09/29/2022    2:07 PM  6CIT Screen  What Year? 0 points 0 points  What month? 0 points 0 points  What time? 0 points 0 points  Count back from 20 0 points 0 points  Months in reverse 0 points 0 points  Repeat phrase 0 points 0 points  Total Score 0 points 0 points    Immunizations Immunization History  Administered Date(s) Administered   Influenza, Seasonal, Injecte, Preservative Fre 03/29/2006, 03/08/2007   Influenza,inj,Quad PF,6+ Mos 02/02/2019   Influenza-Unspecified 01/19/2022, 05/02/2023   Moderna Covid-19 Vaccine Bivalent Booster 66yrs & up 01/31/2021, 10/20/2021   Moderna SARS-COV2 Booster Vaccination 03/08/2020, 08/16/2020   Moderna Sars-Covid-2 Vaccination 05/29/2019, 06/26/2019   PNEUMOCOCCAL CONJUGATE-20 12/07/2022   Pneumococcal Conjugate-13 02/16/2016   Pneumococcal Polysaccharide-23 12/28/2004, 07/23/2014  Tdap 06/24/2018, 12/11/2018    Screening Tests Health Maintenance  Topic Date Due   Zoster Vaccines- Shingrix (1 of 2) Never done   HEMOGLOBIN A1C  11/05/2023   INFLUENZA VACCINE  11/23/2023   FOOT EXAM  12/07/2023   OPHTHALMOLOGY EXAM  05/03/2024   Diabetic kidney evaluation - eGFR measurement  05/07/2024   Diabetic kidney evaluation - Urine ACR  05/07/2024   Medicare Annual Wellness (AWV)  09/06/2024   MAMMOGRAM  12/03/2024   DTaP/Tdap/Td (3 - Td or Tdap) 12/10/2028   Colonoscopy  06/19/2032   Pneumonia Vaccine 26+ Years old  Completed   DEXA SCAN  Completed   Hepatitis C Screening  Completed   HPV VACCINES   Aged Out   Meningococcal B Vaccine  Aged Out   COVID-19 Vaccine  Discontinued    Health Maintenance  Health Maintenance Due  Topic Date Due   Zoster Vaccines- Shingrix (1 of 2) Never done   Health Maintenance Items Addressed:declined shingles  Additional Screening:  Vision Screening: Recommended annual ophthalmology exams for early detection of glaucoma and other disorders of the eye.  Dental Screening: Recommended annual dental exams for proper oral hygiene  Community Resource Referral / Chronic Care Management: CRR required this visit?  No   CCM required this visit?  No   Plan:    I have personally reviewed and noted the following in the patient's chart:   Medical and social history Use of alcohol, tobacco or illicit drugs  Current medications and supplements including opioid prescriptions. Patient is not currently taking opioid prescriptions. Functional ability and status Nutritional status Physical activity Advanced directives List of other physicians Hospitalizations, surgeries, and ER visits in previous 12 months Vitals Screenings to include cognitive, depression, and falls Referrals and appointments  In addition, I have reviewed and discussed with patient certain preventive protocols, quality metrics, and best practice recommendations. A written personalized care plan for preventive services as well as general preventive health recommendations were provided to patient.   Freeda Jerry, [place]   09/07/2023   After Visit Summary: (MyChart) Due to this being a telephonic visit, the after visit summary with patients personalized plan was offered to patient via MyChart   Notes: Nothing significant to report at this time.

## 2023-09-11 ENCOUNTER — Ambulatory Visit: Payer: Self-pay

## 2023-09-11 NOTE — Telephone Encounter (Signed)
 Unable to reach patient by phone. Routing to clinic for further assistance.    Copied from CRM [id number]. Topic: Clinical - Medical Advice >> Sep 11, 2023  4:25 PM Fonda T wrote: Reason for CRM: Patient calling requesting to speak with nurse regarding a prescription for Boost to help relieve ulcer pain in her mouth, as advised by provider as she cannot afford to buy over the counter.   Patient also wanted to make office aware that the magic mouthwash has helped a little, but ulcer is still there.   Patient states she also has an upcoming appointment on Tuesday 09/18/23.  Please call to discuss further [phone].

## 2023-09-12 NOTE — Telephone Encounter (Addendum)
 Tried calling patient no answer or VM    Spoke with patient, explained to her we can not write a prescription for boost. She did state that she is able to eat soft things. Also suggested she switch with salt water.

## 2023-09-28 ENCOUNTER — Telehealth: Payer: Self-pay | Admitting: Internal Medicine

## 2023-09-28 ENCOUNTER — Other Ambulatory Visit: Payer: Self-pay

## 2023-09-28 MED ORDER — NAPROXEN 500 MG PO TABS
500.0000 mg | ORAL_TABLET | Freq: Two times a day (BID) | ORAL | 0 refills | Status: DC
Start: 2023-09-28 — End: 2023-11-08

## 2023-09-28 NOTE — Telephone Encounter (Signed)
 Attempted to call numerous times regarding prescription refill request- Naproxen . Call went straight to VM.  Left VM appropriately.     Please see below that pt is requesting Naproxen - it is not noted on her med list.   Message routed appropriately.

## 2023-09-28 NOTE — Telephone Encounter (Signed)
 Refill sent to pharmacy.

## 2023-09-28 NOTE — Telephone Encounter (Signed)
 Copied from CRM [id number]. Topic: Clinical - Medication Refill >> Sep 28, 2023  9:34 AM Rosaria Common wrote: Medication: Naproxen  500 MG (not on list)  Has the patient contacted their pharmacy? Yes (Agent: If no, request that the patient contact the pharmacy for the refill. If patient does not wish to contact the pharmacy document the reason why and proceed with request.) (Agent: If yes, when and what did the pharmacy advise?)  This is the patient's preferred pharmacy:  [facility] - [place], [place] - [address] [place] [place] [address] Phone: [phone] Fax: [phone]   Is this the correct pharmacy for this prescription? Yes If no, delete pharmacy and type the correct one.   Has the prescription been filled recently? No  Is the patient out of the medication? Yes  Has the patient been seen for an appointment in the last year OR does the patient have an upcoming appointment? Yes  Can we respond through MyChart? Yes  Agent: Please be advised that Rx refills may take up to 3 business days. We ask that you follow-up with your pharmacy.

## 2023-10-15 ENCOUNTER — Encounter: Admitting: Nurse Practitioner

## 2023-10-17 ENCOUNTER — Telehealth: Payer: Self-pay

## 2023-10-17 ENCOUNTER — Other Ambulatory Visit: Payer: Self-pay | Admitting: Internal Medicine

## 2023-10-17 NOTE — Telephone Encounter (Unsigned)
 Copied from CRM [id number]. Topic: Clinical - Medication Refill >> Oct 17, 2023  1:35 PM Nathanel C wrote: Medication: Magnesium  400 MG CAPS Pt has requested 90 day supply please  Has the patient contacted their pharmacy? No   This is the patient's preferred pharmacy:  [facility] - [place], [place] - [address] [place] [address] Phone: [phone] Fax: [phone]  Is this the correct pharmacy for this prescription? Yes If no, delete pharmacy and type the correct one.   Has the prescription been filled recently? Yes  Is the patient out of the medication? Yes  Has the patient been seen for an appointment in the last year OR does the patient have an upcoming appointment? Yes  Can we respond through MyChart? No  Agent: Please be advised that Rx refills may take up to 3 business days. We ask that you follow-up with your pharmacy.

## 2023-10-17 NOTE — Telephone Encounter (Signed)
 Copied from CRM [id number]. Topic: Clinical - Medical Advice >> Oct 17, 2023  1:37 PM Nathanel BROCKS wrote: Reason for CRM:  pt is wanting to know if she is in need of any vaccines. Please call pt and advise

## 2023-10-18 MED ORDER — MAGNESIUM 400 MG PO CAPS
1.0000 | ORAL_CAPSULE | Freq: Every day | ORAL | 0 refills | Status: DC
Start: 2023-10-18 — End: 2023-12-27

## 2023-10-18 NOTE — Telephone Encounter (Signed)
 Patient advised per [place] Record and verbalized understanding.

## 2023-10-18 NOTE — Addendum Note (Signed)
 Addended by: ZELIA GAUZE D on: 10/18/2023 08:10 AM   Modules accepted: Orders

## 2023-10-18 NOTE — Telephone Encounter (Signed)
 Refilled today

## 2023-10-18 NOTE — Telephone Encounter (Signed)
 She needs her Shingrix vaccine.  She can have this done at the pharmacy

## 2023-10-22 ENCOUNTER — Telehealth: Payer: Self-pay

## 2023-10-22 NOTE — Telephone Encounter (Signed)
 She is already taking famotidine  and omeprazole .  I would have her continue with Pepto Bismol however if symptoms do not improve would recommend appointment for evaluation

## 2023-10-22 NOTE — Telephone Encounter (Signed)
 Spoke with patient she is taking her prescription medicines. She will take pepto as directed on the bottle. Explained to her if no better she will need to schedule an appointment.

## 2023-10-22 NOTE — Telephone Encounter (Signed)
 Copied from CRM [id number]. Topic: Clinical - Medical Advice >> Oct 22, 2023 10:56 AM Sophia H wrote: Reason for CRM: Patient states her stomach has been bothering her, wanting to know if there is anything she can take to help out with her sore stomach. Please advise # [phone]

## 2023-10-30 ENCOUNTER — Other Ambulatory Visit: Payer: Self-pay | Admitting: Internal Medicine

## 2023-11-01 NOTE — Telephone Encounter (Signed)
 Requested Prescriptions  Pending Prescriptions Disp Refills   omeprazole  (PRILOSEC) 20 MG capsule [Pharmacy Med Name: OMEPRAZOLE  DR 20 MG CAP] 90 capsule 1    Sig: TAKE 1 CAPSULE BY MOUTH DAILY     Gastroenterology: Proton Pump Inhibitors Passed - 11/01/2023 12:10 PM      Passed - Valid encounter within last 12 months    Recent Outpatient Visits           2 months ago Oral ulcer   [place] [facility] [place], [place] W, NP   2 months ago BPPV (benign paroxysmal positional vertigo), bilateral   [place] [facility] [place], Marsa PARAS, DO   2 months ago Benign essential hypertension   [facility] [place], [place] W, NP   4 months ago Upper back pain on left side   [place] [facility] [place], Angeline ORN, NP   4 months ago Poor appetite   [place] [facility] [place], Angeline ORN, NP       Future Appointments             In 1 month [place], Angeline ORN, NP  [facility], [facility]

## 2023-11-07 ENCOUNTER — Other Ambulatory Visit: Payer: Self-pay | Admitting: Internal Medicine

## 2023-11-07 ENCOUNTER — Ambulatory Visit: Payer: Self-pay

## 2023-11-07 DIAGNOSIS — Z1231 Encounter for screening mammogram for malignant neoplasm of breast: Secondary | ICD-10-CM

## 2023-11-07 NOTE — Telephone Encounter (Signed)
 Pt is experiencing diarrhea, hard to control, nothing OTC is helping.    Best contact: [phone]   Phone call placed to patient with no answer. Voicemail left to call back to Nurse Triage.

## 2023-11-07 NOTE — Telephone Encounter (Signed)
 FYI Only or Action Required?: FYI only for provider.  Patient was last seen in primary care on 08/31/2023 by Antonette Angeline ORN, NP.  Called Nurse Triage reporting Diarrhea.  Symptoms began several days ago.  Interventions attempted: OTC medications: Pepto Bismol, Imodium and Rest, hydration, or home remedies.  Symptoms are: diarrhea more than 4 stools a day, abdominal pain relieved by bowel movements (resolved), few spots of light blood on toilet tissue when wiping unchanged (she states the symptoms will improve and then return within a day).  Triage Disposition: See Physician Within 24 Hours  Patient/caregiver understands and will follow disposition?: Yes          Summary: Diarrhea   Pt is experiencing diarrhea, hard to control, nothing OTC is helping.   Best contact: [phone]           Reason for Disposition  [1] MODERATE diarrhea (e.g., 4-6 times / day more than normal) AND [2] present > 48 hours (2 days)  Answer Assessment - Initial Assessment Questions 1. DIARRHEA SEVERITY: How bad is the diarrhea? How many more stools have you had in the past 24 hours than normal?      She states she has had 4 episodes and it started back up 30 minutes ago.  2. ONSET: When did the diarrhea begin?      Saturday night.  3. STOOL DESCRIPTION:  How loose or watery is the diarrhea? What is the stool color? Is there any blood or mucous in the stool?     No blood or mucus, brown liquid and alternates with watery.  4. VOMITING: Are you also vomiting? If Yes, ask: How many times in the past 24 hours?      No.  5. ABDOMEN PAIN: Are you having any abdomen pain? If Yes, ask: What does it feel like? (e.g., crampy, dull, intermittent, constant)      None now, she states about 2 days ago she was having abdominal pain prior to bowel movements.  6. ABDOMEN PAIN SEVERITY: If present, ask: How bad is the pain?  (e.g., Scale 1-10; mild, moderate, or severe)     0.  7.  ORAL INTAKE: If vomiting, Have you been able to drink liquids? How much liquids have you had in the past 24 hours?     She states she drinks a lot of water, at least 4-5 bottles and drinks 2 bottles at night that she keeps on her nightstand.   8. HYDRATION: Any signs of dehydration? (e.g., dry mouth [not just dry lips], too weak to stand, dizziness, new weight loss) When did you last urinate?     Denies.  9. EXPOSURE: Have you traveled to a foreign country recently? Have you been exposed to anyone with diarrhea? Could you have eaten any food that was spoiled?     No spoiled food or travel to foreign countries, exposure to diarrhea or anyone else in the home having symptoms.  10. ANTIBIOTIC USE: Are you taking antibiotics now or have you taken antibiotics in the past 2 months?       No.  11. OTHER SYMPTOMS: Do you have any other symptoms? (e.g., fever, blood in stool)       She states sometimes at the end of her stool she will wipe and see spots of light blood on the toilet tissue (she states she thinks it is from her hemorrhoids). Patient denies any fever.  12. PREGNANCY: Is there any chance you are pregnant? When was your last  menstrual period?       N/A.  Protocols used: [facility]

## 2023-11-07 NOTE — Telephone Encounter (Unsigned)
 Copied from CRM [id number]. Topic: Clinical - Medication Refill >> Nov 07, 2023  1:19 PM Santiya F wrote: Medication: lidocaine  (LIDODERM ) 5 % [[id number]]  Has the patient contacted their pharmacy? Yes  (Agent: If yes, when and what did the pharmacy advise?) call office   This is the patient's preferred pharmacy:  [facility] - [place], [place] - [address] [place] [address] Phone: [phone] Fax: [phone]  Is this the correct pharmacy for this prescription? Yes   Has the prescription been filled recently? Yes  Is the patient out of the medication? Yes  Has the patient been seen for an appointment in the last year OR does the patient have an upcoming appointment? Yes  Can we respond through MyChart? No  Agent: Please be advised that Rx refills may take up to 3 business days. We ask that you follow-up with your pharmacy.

## 2023-11-08 ENCOUNTER — Encounter: Payer: Self-pay | Admitting: Internal Medicine

## 2023-11-08 ENCOUNTER — Ambulatory Visit (INDEPENDENT_AMBULATORY_CARE_PROVIDER_SITE_OTHER): Admitting: Internal Medicine

## 2023-11-08 ENCOUNTER — Telehealth: Payer: Self-pay

## 2023-11-08 VITALS — BP 124/78 | Ht 66.0 in | Wt 184.4 lb

## 2023-11-08 DIAGNOSIS — R8290 Unspecified abnormal findings in urine: Secondary | ICD-10-CM | POA: Diagnosis not present

## 2023-11-08 DIAGNOSIS — R102 Pelvic and perineal pain: Secondary | ICD-10-CM

## 2023-11-08 DIAGNOSIS — R197 Diarrhea, unspecified: Secondary | ICD-10-CM

## 2023-11-08 LAB — POCT URINE DIPSTICK
Bilirubin, UA: NEGATIVE
Glucose, UA: NEGATIVE mg/dL
Ketones, POC UA: NEGATIVE mg/dL
Nitrite, UA: NEGATIVE
POC PROTEIN,UA: NEGATIVE
Spec Grav, UA: 1.01 (ref 1.010–1.025)
Urobilinogen, UA: 0.2 U/dL
pH, UA: 6 (ref 5.0–8.0)

## 2023-11-08 MED ORDER — NAPROXEN 500 MG PO TABS: 500.0000 mg | ORAL_TABLET | Freq: Two times a day (BID) | ORAL | 0 refills | Status: AC

## 2023-11-08 MED ORDER — LIDOCAINE 5 % EX PTCH: 1.0000 | MEDICATED_PATCH | CUTANEOUS | 1 refills | Status: DC

## 2023-11-08 MED ORDER — TRIAMCINOLONE ACETONIDE 0.1 % EX CREA: 1.0000 | TOPICAL_CREAM | Freq: Two times a day (BID) | CUTANEOUS | 0 refills | Status: AC

## 2023-11-08 MED ORDER — FLUTICASONE PROPIONATE 50 MCG/ACT NA SUSP: 2.0000 | Freq: Every day | NASAL | 3 refills | Status: AC

## 2023-11-08 NOTE — Telephone Encounter (Signed)
 Copied from CRM [id number]. Topic: Clinical - Lab/Test Results >> Nov 08, 2023  3:16 PM Santiya F wrote: Reason for CRM: Patient is calling in because she received a notification on MyChart saying one of her labs was abnormal and she had concerns about what that meant.

## 2023-11-08 NOTE — Telephone Encounter (Signed)
 Patient advised and verbalized understanding

## 2023-11-08 NOTE — Telephone Encounter (Signed)
 Will discuss at upcoming appointment

## 2023-11-08 NOTE — Telephone Encounter (Signed)
 Requested Prescriptions  Pending Prescriptions Disp Refills   lidocaine  (LIDODERM ) 5 % 90 patch 1    Sig: Place 1 patch onto the skin daily. Remove & Discard patch within 12 hours or as directed by MD     Analgesics:  Topicals Failed - 11/08/2023  5:00 PM      Failed - Manual Review: Labs are only required if the patient has taken medication for more than 8 weeks.      Failed - HGB in normal range and within 360 days    Hemoglobin  Date Value Ref Range Status  05/08/2023 10.8 (L) 11.7 - 15.5 g/dL Final         Passed - PLT in normal range and within 360 days    Platelets  Date Value Ref Range Status  05/08/2023 272 140 - 400 Thousand/uL Final         Passed - HCT in normal range and within 360 days    HCT  Date Value Ref Range Status  05/08/2023 35.7 35.0 - 45.0 % Final         Passed - Cr in normal range and within 360 days    Creat  Date Value Ref Range Status  05/08/2023 0.74 0.60 - 1.00 mg/dL Final   Creatinine, Urine  Date Value Ref Range Status  05/08/2023 147 20 - 275 mg/dL Final         Passed - eGFR is 30 or above and within 360 days    GFR calc Af Amer  Date Value Ref Range Status  09/18/2018 >60 >60 mL/min Final   GFR calc non Af Amer  Date Value Ref Range Status  09/18/2018 >60 >60 mL/min Final   eGFR  Date Value Ref Range Status  05/08/2023 87 > OR = 60 mL/min/1.67m2 Final         Passed - Patient is not pregnant      Passed - Valid encounter within last 12 months    Recent Outpatient Visits           Today Suprapubic pain   [place] [facility] [place], Angeline ORN, NP   2 months ago Oral ulcer   [place] [place], [place] W, NP   2 months ago BPPV (benign paroxysmal positional vertigo), bilateral   [place] [place], Marsa PARAS, DO   2 months ago Benign essential hypertension   [place] [facility] [place], [place] W, NP   4 months ago Upper back  pain on left side   [facility] [place], Angeline ORN, NP       Future Appointments             In 1 month [place], Angeline ORN, NP  [facility], [facility]

## 2023-11-08 NOTE — Patient Instructions (Signed)
 Diarrhea, Adult Diarrhea is when you pass loose and sometimes watery poop (stool) often. Diarrhea can make you feel weak and cause you to lose water in your body (get dehydrated). Losing water in your body can cause you to: Feel tired and thirsty. Have a dry mouth. Go pee (urinate) less often. Diarrhea often lasts 2-3 days. It can last longer if it is a sign of something more serious. Be sure to treat your diarrhea as told by your doctor. Follow these instructions at home: Eating and drinking     Follow these instructions as told by your doctor: Take an ORS (oral rehydration solution). This is a drink that helps you replace fluids and minerals your body lost. It is sold at pharmacies and stores. Drink enough fluid to keep your pee (urine) pale yellow. Drink fluids such as: Water. You can also get fluids by sucking on ice chips. Diluted fruit juice. Low-calorie sports drinks. Milk. Avoid drinking fluids that have a lot of sugar or caffeine in them. These include soda, energy drinks, and regular sports drinks. Avoid alcohol. Eat bland, easy-to-digest foods in small amounts as you are able. These foods include: Bananas. Applesauce. Rice. Low-fat (lean) meats. Toast. Crackers. Avoid spicy or fatty foods.  Medicines Take over-the-counter and prescription medicines only as told by your doctor. If you were prescribed antibiotics, take them as told by your doctor. Do not stop taking them even if you start to feel better. General instructions  Wash your hands often using soap and water for 20 seconds. If soap and water are not available, use hand sanitizer. Others in your home should wash their hands as well. Wash your hands: After using the toilet or changing a diaper. Before preparing, cooking, or serving food. While caring for a sick person. While visiting someone in a hospital. Rest at home while you get better. Take a warm bath to help with any burning or pain from having  diarrhea. Watch your condition for any changes. Contact a doctor if: You have a fever. Your diarrhea gets worse. You have new symptoms. You vomit every time you eat or drink. You feel light-headed, dizzy, or you have a headache. You have muscle cramps. You have signs of losing too much water in your body, such as: Dark pee, very little pee, or no pee. Cracked lips. Dry mouth. Sunken eyes. Sleepiness. Weakness. You have bloody or black poop or poop that looks like tar. You have very bad pain, cramping, or bloating in your belly (abdomen). Your skin feels cold and clammy. You feel confused. Get help right away if: You have chest pain. Your heart is beating very quickly. You have trouble breathing or you are breathing very quickly. You feel very weak or you faint. These symptoms may be an emergency. Get help right away. Call 911. Do not wait to see if the symptoms will go away. Do not drive yourself to the hospital. This information is not intended to replace advice given to you by your health care provider. Make sure you discuss any questions you have with your health care provider. Document Revised: 09/27/2021 Document Reviewed: 09/27/2021 Elsevier Patient Education  2024 [employer].

## 2023-11-08 NOTE — Telephone Encounter (Signed)
 Urine showed some trace blood and trace leuks.  We are sending urine culture for further evaluation.

## 2023-11-08 NOTE — Progress Notes (Signed)
 HPI  Discussed the use of AI scribe software for clinical note transcription with the patient, who gave verbal consent to proceed.   Stacy Cunningham is a 73 year old female who presents with diarrhea.  She has been experiencing diarrhea since Saturday, initially with three stools per day, increasing to four on Sunday. She took Pepto Bismol on Monday, which provided some relief, but the diarrhea returned on Tuesday with three episodes back to back, continuing on Wednesday and Thursday with three episodes each day. She notes lower abdominal pain 2 days ago which has since resolved.  She also noted a small amount of blood when wiping, attributed to irritation from frequent wiping, which is not unusual for her.  No nausea, vomiting, fever, or chills. Her stools are described as brown with a 'funny smell,' but not foul-smelling.  There have been no recent changes in diet or medications, and she has been on metformin  for years without previous issues. She denies recent travel or antibiotic use. She has been consuming a bland diet including gatorade, crackers, chicken soup, and applesauce.   Colonoscopy from 05/2022 did show evidence of diverticulosis but she has not had any cases of diverticulitis that she can remember.      Past Medical History:  Diagnosis Date   Anemia    Borderline diabetes mellitus    Diabetes mellitus without complication (HCC)    Elevated lipids    GERD (gastroesophageal reflux disease)    Glaucoma    Headache    h/o migraines   Hypertension    Neuropathy    Palpitations    a. 05/2020 Zio: Predominantly sinus rhythm, 80 (60-148).  1 SVT run-4 beats at 148.  Rare PACs and PVCs; b. 04/2021 Zio: Predominantly sinus rhythm, 80 (59-140).  Rare PACs/PVCs.   Panic attack     Current Outpatient Medications  Medication Sig Dispense Refill   aspirin 81 MG EC tablet Take 81 mg by mouth daily.     Blood Glucose Monitoring Suppl (ACCU-CHEK GUIDE ME) w/Device KIT      brimonidine  (ALPHAGAN) 0.15 % ophthalmic solution Place 1 drop into both eyes 3 (three) times daily.     butalbital-acetaminophen -caffeine (FIORICET) 50-325-40 MG tablet Take 1 tablet by mouth every 4 (four) hours as needed.     clobetasol ointment (TEMOVATE) 0.05 % Apply 1 Application topically.     clotrimazole -betamethasone  (LOTRISONE ) cream APPLY TOPICALLY TO AFFECTED AREAS 2 TIMES DAILY 45 g 0   dorzolamide-timolol  (COSOPT) 22.3-6.8 MG/ML ophthalmic solution Place 1 drop into both eyes at bedtime.     famotidine  (PEPCID ) 40 MG tablet Take 1 tablet (40 mg total) by mouth daily. 90 tablet 1   ferrous sulfate  325 (65 FE) MG tablet Take 1 tablet (325 mg total) by mouth 2 (two) times daily with a meal. 180 tablet 1   fexofenadine  (ALLEGRA  ALLERGY) 180 MG tablet Take 1 tablet (180 mg total) by mouth daily. 90 tablet 0   fluticasone  (FLONASE ) 50 MCG/ACT nasal spray Place 2 sprays into both nostrils daily. Use for 4-6 weeks then stop and use seasonally or as needed. 16 g 3   gabapentin  (NEURONTIN ) 800 MG tablet Take one tablet AM, one PM, two QHS 360 tablet 3   hydrochlorothiazide  (HYDRODIURIL ) 25 MG tablet TAKE 1 TABLET BY MOUTH DAILY 90 tablet 1   lidocaine  (LIDODERM ) 5 % Place 1 patch onto the skin daily. Remove & Discard patch within 12 hours or as directed by MD 90 patch 1   lisinopril  (  ZESTRIL ) 2.5 MG tablet TAKE 1 TABLET BY MOUTH DAILY FOR KIDNEY PROTECTION 90 tablet 1   magic mouthwash w/lidocaine  SOLN Take 5 mLs by mouth 4 (four) times daily as needed for mouth pain. 120 mL 0   Magnesium  400 MG CAPS Take 1 capsule by mouth daily at 12 noon. 90 capsule 0   metFORMIN  (GLUCOPHAGE ) 500 MG tablet TAKE 2 TABLETS BY MOUTH DAILY WITH BREAKFAST AND 1 TABLET IN THE EVENING 270 tablet 0   naproxen  (NAPROSYN ) 500 MG tablet Take 1 tablet (500 mg total) by mouth 2 (two) times daily with a meal. 60 tablet 0   nortriptyline (PAMELOR) 10 MG capsule Take 10 mg by mouth 2 (two) times daily.     omeprazole  (PRILOSEC) 20 MG  capsule TAKE 1 CAPSULE BY MOUTH DAILY 90 capsule 1   propranolol  ER (INDERAL  LA) 60 MG 24 hr capsule Take 1 capsule (60 mg total) by mouth daily. 90 capsule 0   ROCKLATAN 0.02-0.005 % SOLN Apply to eye.     simvastatin  (ZOCOR ) 20 MG tablet Take 1 tablet (20 mg total) by mouth every evening. 90 tablet 1   No current facility-administered medications for this visit.    Allergies  Allergen Reactions   Sulfa Antibiotics Dermatitis   Darvon [Propoxyphene] Hives   Other Other (See Comments) and Rash    PAPER TAPE OK TO USE PAPER TAPE OK TO USE    Tape Rash    PAPER TAPE OK TO USE    Family History  Problem Relation Age of Onset   Breast cancer Sister 26   Heart attack Mother 24   Hyperlipidemia Mother    Hypertension Mother     Social History   Socioeconomic History   Marital status: Married    Spouse name: Not on file   Number of children: Not on file   Years of education: Not on file   Highest education level: Not on file  Occupational History   Not on file  Tobacco Use   Smoking status: Never   Smokeless tobacco: Never  Vaping Use   Vaping status: Never Used  Substance and Sexual Activity   Alcohol use: Not Currently    Comment: rare   Drug use: No   Sexual activity: Yes  Other Topics Concern   Not on file  Social History Narrative   Not on file   Social Drivers of Health   Financial Resource Strain: Low Risk  (09/07/2023)   Overall Financial Resource Strain (CARDIA)    Difficulty of Paying Living Expenses: Not hard at all  Food Insecurity: No Food Insecurity (09/07/2023)   Hunger Vital Sign    Worried About Running Out of Food in the Last Year: Never true    Ran Out of Food in the Last Year: Never true  Transportation Needs: No Transportation Needs (09/07/2023)   PRAPARE - Administrator, Civil Service (Medical): No    Lack of Transportation (Non-Medical): No  Physical Activity: Insufficiently Active (09/07/2023)   Exercise Vital Sign    Days  of Exercise per Week: 7 days    Minutes of Exercise per Session: 20 min  Stress: No Stress Concern Present (09/07/2023)   [employer] of Occupational Health - Occupational Stress Questionnaire    Feeling of Stress : Not at all  Social Connections: Moderately Isolated (09/07/2023)   Social Connection and Isolation Panel    Frequency of Communication with Friends and Family: More than three times a week  Frequency of Social Gatherings with Friends and Family: More than three times a week    Attends Religious Services: More than 4 times per year    Active Member of [employer] or Organizations: No    Attends Banker Meetings: Never    Marital Status: Separated  Intimate Partner Violence: Not At Risk (09/07/2023)   Humiliation, Afraid, Rape, and Kick questionnaire    Fear of Current or Ex-Partner: No    Emotionally Abused: No    Physically Abused: No    Sexually Abused: No    ROS:  Constitutional: Patient reports daily headaches.  Denies fever, malaise, fatigue, or abrupt weight changes.  Respiratory: Denies difficulty breathing, shortness of breath, cough or sputum production.   Cardiovascular: Denies chest pain, chest tightness, palpitations or swelling in the hands or feet.  Gastrointestinal: Patient reports reflux, diarrhea and blood in stool.  Denies abdominal pain, bloating, constipation, diarrhea or blood in the stool.  GU: Denies frequency, urgency, pain with urination, blood in urine, odor or discharge. Musculoskeletal: Patient reports chronic low back pain.  Denies decrease in range of motion, difficulty with gait, muscle pain or joint swelling.  Skin: Denies redness, rashes, lesions or ulcercations.  Neurological: Patient reports neuropathic pain.  Denies dizziness, difficulty with memory, difficulty with speech or problems with balance and coordination.  No other specific complaints in a complete review of systems (except as listed in HPI above).  PE: BP 124/78  (BP Location: Left Arm, Patient Position: Sitting, Cuff Size: Normal)   Ht 5' 6 (1.676 m)   Wt 184 lb 6.4 oz (83.6 kg)   BMI 29.76 kg/m     Wt Readings from Last 3 Encounters:  09/07/23 187 lb (84.8 kg)  08/31/23 186 lb 12.8 oz (84.7 kg)  08/24/23 185 lb 6 oz (84.1 kg)    General: Appears her stated age, overweight, in NAD. Cardiovascular: Normal rate and rhythm. S1,S2 noted.  No murmur, rubs or gallops noted.  Pulmonary/Chest: Normal effort and positive vesicular breath sounds. No respiratory distress. No wheezes, rales or ronchi noted.  Abdomen:  Soft, tender in the suprapubic region. Hyperactive bowel sounds. No distention or masses noted. No CVA tenderness. Musculoskeletal: No difficulty with gait.  Neurological: Alert and oriented.     BMET    Component Value Date/Time   NA 142 05/08/2023 1123   K 3.7 05/08/2023 1123   CL 106 05/08/2023 1123   CO2 30 05/08/2023 1123   GLUCOSE 142 (H) 05/08/2023 1123   BUN 7 05/08/2023 1123   CREATININE 0.74 05/08/2023 1123   CALCIUM 9.5 05/08/2023 1123   GFRNONAA >60 09/18/2018 1030   GFRAA >60 09/18/2018 1030    Lipid Panel     Component Value Date/Time   CHOL 135 05/08/2023 1123   TRIG 103 05/08/2023 1123   HDL 51 05/08/2023 1123   CHOLHDL 2.6 05/08/2023 1123   LDLCALC 65 05/08/2023 1123    CBC    Component Value Date/Time   WBC 7.0 05/08/2023 1123   RBC 4.94 05/08/2023 1123   HGB 10.8 (L) 05/08/2023 1123   HCT 35.7 05/08/2023 1123   PLT 272 05/08/2023 1123   MCV 72.3 (L) 05/08/2023 1123   MCH 21.9 (L) 05/08/2023 1123   MCHC 30.3 (L) 05/08/2023 1123   RDW 16.2 (H) 05/08/2023 1123   LYMPHSABS 2.4 06/05/2018 0608   MONOABS 0.5 06/05/2018 0608   EOSABS 0.1 06/05/2018 0608   BASOSABS 0.1 06/05/2018 0608    Hgb A1C Lab Results  Component Value Date   HGBA1C 6.8 (A) 05/08/2023     Assessment and Plan:  Assessment and Plan    Suprapubic pain, diarrhea Acute diarrhea with differential diagnosis including  metformin -induced diarrhea, infectious diarrhea, and diverticulitis.  - Continue Imodium or Pepto Bismol as needed. Informed about black stools with Pepto Bismol. - Order stool culture, GI pathogen panel, and C. difficile test. - Order CBC, metabolic panel, and urinalysis. -Urinalysis showed trace blood and small leukocytes, will send urine culture for further evaluation - Advise bland diet and hydration with Gatorade. - Consider CT scan abdomen/pelvis with contrast if symptoms worsen to rule out diverticulitis. - Discuss potential GI referral post-lab results.   RTC in 1 months for your annual exam Angeline Laura, NP

## 2023-11-09 ENCOUNTER — Ambulatory Visit: Payer: Self-pay | Admitting: Internal Medicine

## 2023-11-09 LAB — COMPREHENSIVE METABOLIC PANEL WITH GFR
AG Ratio: 1.8 (calc) (ref 1.0–2.5)
ALT: 15 U/L (ref 6–29)
AST: 16 U/L (ref 10–35)
Albumin: 4.3 g/dL (ref 3.6–5.1)
Alkaline phosphatase (APISO): 89 U/L (ref 37–153)
BUN: 7 mg/dL (ref 7–25)
CO2: 31 mmol/L (ref 20–32)
Calcium: 9.3 mg/dL (ref 8.6–10.4)
Chloride: 103 mmol/L (ref 98–110)
Creat: 0.79 mg/dL (ref 0.60–1.00)
Globulin: 2.4 g/dL (ref 1.9–3.7)
Glucose, Bld: 106 mg/dL — ABNORMAL HIGH (ref 65–99)
Potassium: 4.5 mmol/L (ref 3.5–5.3)
Sodium: 140 mmol/L (ref 135–146)
Total Bilirubin: 0.4 mg/dL (ref 0.2–1.2)
Total Protein: 6.7 g/dL (ref 6.1–8.1)
eGFR: 80 mL/min/1.73m2 (ref >=60)

## 2023-11-09 LAB — CBC
HCT: 36.4 % (ref 35.0–45.0)
Hemoglobin: 10.8 g/dL — ABNORMAL LOW (ref 11.7–15.5)
MCH: 21.8 pg — ABNORMAL LOW (ref 27.0–33.0)
MCHC: 29.7 g/dL — ABNORMAL LOW (ref 32.0–36.0)
MCV: 73.4 fL — ABNORMAL LOW (ref 80.0–100.0)
MPV: 11.1 fL (ref 7.5–12.5)
Platelets: 261 Thousand/uL (ref 140–400)
RBC: 4.96 Million/uL (ref 3.80–5.10)
RDW: 15.8 % — ABNORMAL HIGH (ref 11.0–15.0)
WBC: 7.1 Thousand/uL (ref 3.8–10.8)

## 2023-11-11 LAB — URINE CULTURE
MICRO NUMBER:: 16713329
SPECIMEN QUALITY:: ADEQUATE

## 2023-11-12 ENCOUNTER — Telehealth: Payer: Self-pay

## 2023-11-12 MED ORDER — FERROUS SULFATE 325 (65 FE) MG PO TABS: 325.0000 mg | ORAL_TABLET | Freq: Two times a day (BID) | ORAL | 1 refills | Status: DC

## 2023-11-12 NOTE — Telephone Encounter (Signed)
 Patient was advised of her labs results per [facility]. Verbalized understanding. She states she hasn't had anymore diarrhea, some abdominal pain, but nothing significant. No further questions or concerns at this time.

## 2023-11-12 NOTE — Telephone Encounter (Signed)
 Copied from CRM [id number]. Topic: Clinical - Lab/Test Results >> Nov 12, 2023 12:54 PM Sophia H wrote: Reason for CRM: Patient is calling in regarding questions she has about her lab test results that she doesn't understand.  Please reach out # [phone]

## 2023-11-13 LAB — C. DIFFICILE GDH AND TOXIN A/B
GDH ANTIGEN: NOT DETECTED
MICRO NUMBER:: 16719184
SPECIMEN QUALITY:: ADEQUATE
TOXIN A AND B: NOT DETECTED

## 2023-11-13 LAB — GASTROINTESTINAL PATHOGEN PNL
CampyloBacter Group: NOT DETECTED
Norovirus GI/GII: NOT DETECTED
Rotavirus A: NOT DETECTED
Salmonella species: NOT DETECTED
Shiga Toxin 1: NOT DETECTED
Shiga Toxin 2: NOT DETECTED
Shigella Species: NOT DETECTED
Vibrio Group: NOT DETECTED
Yersinia enterocolitica: NOT DETECTED

## 2023-11-20 ENCOUNTER — Ambulatory Visit: Payer: Self-pay

## 2023-11-20 NOTE — Telephone Encounter (Signed)
Patient is scheduled for Monday at 1pm.  °

## 2023-11-20 NOTE — Telephone Encounter (Signed)
 FYI Only or Action Required?: Action required by provider: requesting medication for her loss of appetite/weight loss.  Patient was last seen in primary care on 11/08/2023 by Antonette Angeline ORN, NP.  Called Nurse Triage reporting Weight Loss.  Symptoms began a week ago.  Symptoms are: gradually worsening.  Triage Disposition: See PCP When Office is Open (Within 3 Days)  Patient/caregiver understands and will follow disposition?: No, wishes to speak with PCP     Patient states she is losing weight and her appetite has not been good. She has to make herself eat. She would like something sent to her preferred pharmacy   Callback #: [phone]   Preferred Pharmacy:  [facility] - [place], [place] - [address]  [address] [place] [address]  Phone: [phone] Fax: [phone]  Hours: Not open 24 hours     Reason for Disposition  MODERATE unexplained weight loss (e.g., 5%, 8 to 10 pounds [4 - 4.5 kg] in person who weighs 170 to 200 pounds [75 - 90 kg])  Answer Assessment - Initial Assessment Questions Patient declined an appointment at this time and would like to know if there are any medications or vitamins she could be prescribed to help with her recent loss of appetite and weight loss. Please advise.     1. MAIN CONCERN: What is your main concern today?     Losing weight due to loss of appetite 2. WEIGHT LOSS: How much weight have you lost?  (e.g., lbs., kgs.)  Over what period of time have you lost this weight?  (e.g., number of days, weeks, months, years)     Approximately 180 3. BASELINE WEIGHT: What is your baseline or normal weight? (e.g., How much do you usually weigh?)     Usually around 200 4. CAUSE: What do you think is causing the weight loss? (e.g., depression, anxiety, medicine side effect, pain, trouble swallowing, substance or alcohol use problem, eating disorder)     Unsure  5. PRIOR EVALUATION: Have you been  evaluated by a doctor for your weight loss? If Yes, ask When was your last visit? What did your doctor (or NP/PA) tell you about the possible cause?     No 6. HEART FAILURE TREATMENT: Do you have heart failure? If Yes, ask: Have you taken new or extra water pills (diuretics) recently? (e.g., furosemide; bumetanide). What is your target weight?     No 7. OTHER SYMPTOMS: Do you have any other symptoms? (e.g., anxiety or depression, blood in stool, breathing difficulty, diarrhea, fever, trouble swallowing)     No  Protocols used: Weight Loss - Unintended-A-AH

## 2023-11-20 NOTE — Telephone Encounter (Signed)
 She may have been in the office recently however we did not discuss this.  We have not discussed this issue since February 2025.  She will need an appointment to discuss further.

## 2023-11-26 ENCOUNTER — Ambulatory Visit (INDEPENDENT_AMBULATORY_CARE_PROVIDER_SITE_OTHER): Admitting: Internal Medicine

## 2023-11-26 ENCOUNTER — Encounter: Payer: Self-pay | Admitting: Internal Medicine

## 2023-11-26 ENCOUNTER — Ambulatory Visit
Admission: RE | Admit: 2023-11-26 | Discharge: 2023-11-26 | Disposition: A | Discharge status: Home / Self Care | Source: Ambulatory Visit | Attending: Internal Medicine | Admitting: Internal Medicine

## 2023-11-26 VITALS — BP 118/82 | Ht 66.0 in | Wt 183.6 lb

## 2023-11-26 DIAGNOSIS — K219 Gastro-esophageal reflux disease without esophagitis: Secondary | ICD-10-CM

## 2023-11-26 DIAGNOSIS — R634 Abnormal weight loss: Secondary | ICD-10-CM | POA: Insufficient documentation

## 2023-11-26 DIAGNOSIS — Z114 Encounter for screening for human immunodeficiency virus [HIV]: Secondary | ICD-10-CM

## 2023-11-26 DIAGNOSIS — Z6829 Body mass index (BMI) 29.0-29.9, adult: Secondary | ICD-10-CM

## 2023-11-26 DIAGNOSIS — E663 Overweight: Secondary | ICD-10-CM

## 2023-11-26 DIAGNOSIS — R630 Anorexia: Secondary | ICD-10-CM

## 2023-11-26 NOTE — Progress Notes (Signed)
 Subjective:    Patient ID: Stacy Cunningham, female    DOB: December 16, 1950, 73 y.o.   MRN: [id number]  HPI  Discussed the use of AI scribe software for clinical note transcription with the patient, who gave verbal consent to proceed.   Stacy Cunningham is a 73 year old female who presents with decreased appetite and unintentional weight loss.  Over the past six months, she has experienced a significant decrease in appetite, leading to unintentional weight loss. She feels hungry but is unable to eat much once food is in front of her, stating, 'I'm hungry and when I get to the table and get my food, I start eating and I eat a little bit of it and then I don't want no more.' She struggles to eat breakfast and lunch, often only eating in the evening, and even then, she cannot finish her meals. Over the past year, she has lost 16 pounds, decreasing from 201 pounds to 184 pounds. Her pants have become baggy, indicating weight loss, although she has not lost weight since her last visit.  She has a history of diarrhea, which has resolved, and she suspects lactose intolerance as a contributing factor. She has been diluting whole milk with water to manage symptoms.  She had a negative GI pathogen panel and C. difficile, 10/2023.  Her appetite issues have persisted despite the resolution of previous dental problems, which included painful dentures and healing gums.  She takes omeprazole  and famotidine  regularly for reflux. She never completed her H. pylori stool study.   She reports feeling sluggish and tired despite taking iron supplements twice daily.   No mood issues such as anxiety or depression, and no postmenopausal vaginal bleeding.  She no longer screens for cervical cancer.  She gets her mammogram screenings yearly and has a upcoming mammogram scheduled.  She has a history of a thyroid  condition with nodules, which is monitored by her doctor.  She has never smoked.    Financial constraints prevent her from  purchasing nutritional supplements like Boost or Ensure.       Review of Systems    Past Medical History:  Diagnosis Date   Anemia    Borderline diabetes mellitus    Diabetes mellitus without complication (HCC)    Elevated lipids    GERD (gastroesophageal reflux disease)    Glaucoma    Headache    h/o migraines   Hypertension    Neuropathy    Palpitations    a. 05/2020 [facility]: Predominantly sinus rhythm, 80 (60-148).  1 SVT run-4 beats at 148.  Rare PACs and PVCs; b. 04/2021 [facility]: Predominantly sinus rhythm, 80 (59-140).  Rare PACs/PVCs.   Panic attack     Current Outpatient Medications  Medication Sig Dispense Refill   aspirin 81 MG EC tablet Take 81 mg by mouth daily.     brimonidine (ALPHAGAN) 0.15 % ophthalmic solution Place 1 drop into both eyes 3 (three) times daily.     butalbital-acetaminophen -caffeine (FIORICET) 50-325-40 MG tablet Take 1 tablet by mouth every 4 (four) hours as needed.     clobetasol ointment (TEMOVATE) 0.05 % Apply 1 Application topically.     clotrimazole -betamethasone  (LOTRISONE ) cream APPLY TOPICALLY TO AFFECTED AREAS 2 TIMES DAILY 45 g 0   dorzolamide-timolol  (COSOPT) 22.3-6.8 MG/ML ophthalmic solution Place 1 drop into both eyes at bedtime.     famotidine  (PEPCID ) 40 MG tablet Take 1 tablet (40 mg total) by mouth daily. 90 tablet 1   ferrous sulfate  325 (65  FE) MG tablet Take 1 tablet (325 mg total) by mouth 2 (two) times daily with a meal. 180 tablet 1   fexofenadine  (ALLEGRA  ALLERGY) 180 MG tablet Take 1 tablet (180 mg total) by mouth daily. 90 tablet 0   fluticasone  (FLONASE ) 50 MCG/ACT nasal spray Place 2 sprays into both nostrils daily. Use for 4-6 weeks then stop and use seasonally or as needed. 16 g 3   gabapentin  (NEURONTIN ) 800 MG tablet Take one tablet AM, one PM, two QHS 360 tablet 3   hydrochlorothiazide  (HYDRODIURIL ) 25 MG tablet TAKE 1 TABLET BY MOUTH DAILY 90 tablet 1   lidocaine  (LIDODERM ) 5 % Place 1 patch onto the skin daily. Remove &  Discard patch within 12 hours or as directed by MD 90 patch 1   lisinopril  (ZESTRIL ) 2.5 MG tablet TAKE 1 TABLET BY MOUTH DAILY FOR KIDNEY PROTECTION 90 tablet 1   Magnesium  400 MG CAPS Take 1 capsule by mouth daily at 12 noon. 90 capsule 0   metFORMIN  (GLUCOPHAGE ) 500 MG tablet TAKE 2 TABLETS BY MOUTH DAILY WITH BREAKFAST AND 1 TABLET IN THE EVENING 270 tablet 0   naproxen  (NAPROSYN ) 500 MG tablet Take 1 tablet (500 mg total) by mouth 2 (two) times daily with a meal. 60 tablet 0   nortriptyline (PAMELOR) 10 MG capsule Take 10 mg by mouth 2 (two) times daily.     omeprazole  (PRILOSEC) 20 MG capsule TAKE 1 CAPSULE BY MOUTH DAILY 90 capsule 1   propranolol  ER (INDERAL  LA) 60 MG 24 hr capsule Take 1 capsule (60 mg total) by mouth daily. (Patient not taking: Reported on 11/08/2023) 90 capsule 0   ROCKLATAN 0.02-0.005 % SOLN Apply to eye.     simvastatin  (ZOCOR ) 20 MG tablet Take 1 tablet (20 mg total) by mouth every evening. 90 tablet 1   triamcinolone  cream (KENALOG ) 0.1 % Apply 1 Application topically 2 (two) times daily. 30 g 0   No current facility-administered medications for this visit.    Allergies  Allergen Reactions   Sulfa Antibiotics Dermatitis   Darvon [Propoxyphene] Hives   Other Other (See Comments) and Rash    PAPER TAPE OK TO USE PAPER TAPE OK TO USE    Tape Rash    PAPER TAPE OK TO USE    Family History  Problem Relation Age of Onset   Breast cancer Sister 64   Heart attack Mother 34   Hyperlipidemia Mother    Hypertension Mother     Social History   Socioeconomic History   Marital status: Married    Spouse name: Not on file   Number of children: Not on file   Years of education: Not on file   Highest education level: Not on file  Occupational History   Not on file  Tobacco Use   Smoking status: Never   Smokeless tobacco: Never  Vaping Use   Vaping status: Never Used  Substance and Sexual Activity   Alcohol use: Not Currently    Comment: rare   Drug  use: No   Sexual activity: Yes  Other Topics Concern   Not on file  Social History Narrative   Not on file   Social Drivers of Health   Financial Resource Strain: Low Risk  (09/07/2023)   Overall Financial Resource Strain (CARDIA)    Difficulty of Paying Living Expenses: Not hard at all  Food Insecurity: No Food Insecurity (09/07/2023)   Hunger Vital Sign    Worried About Running Out of  Food in the Last Year: Never true    Ran Out of Food in the Last Year: Never true  Transportation Needs: No Transportation Needs (09/07/2023)   PRAPARE - Administrator, Civil Service (Medical): No    Lack of Transportation (Non-Medical): No  Physical Activity: Insufficiently Active (09/07/2023)   Exercise Vital Sign    Days of Exercise per Week: 7 days    Minutes of Exercise per Session: 20 min  Stress: No Stress Concern Present (09/07/2023)   [employer] of Occupational Health - Occupational Stress Questionnaire    Feeling of Stress : Not at all  Social Connections: Moderately Isolated (09/07/2023)   Social Connection and Isolation Panel    Frequency of Communication with Friends and Family: More than three times a week    Frequency of Social Gatherings with Friends and Family: More than three times a week    Attends Religious Services: More than 4 times per year    Active Member of [employer] or Organizations: No    Attends Banker Meetings: Never    Marital Status: Separated  Intimate Partner Violence: Not At Risk (09/07/2023)   Humiliation, Afraid, Rape, and Kick questionnaire    Fear of Current or Ex-Partner: No    Emotionally Abused: No    Physically Abused: No    Sexually Abused: No     Constitutional: Patient reports unintentional weight loss, intermittent headaches, fatigue.  Denies fever, malaise, headache.  HEENT: Denies eye pain, eye redness, ear pain, ringing in the ears, wax buildup, runny nose, nasal congestion, bloody nose, or sore throat. Respiratory:  Denies difficulty breathing, shortness of breath, cough or sputum production.   Cardiovascular: Denies chest pain, chest tightness, palpitations or swelling in the hands or feet.  Gastrointestinal: Patient reports poor appetite, intermittent diarrhea.  Denies abdominal pain, bloating, constipation, or blood in the stool.  GU: Denies urgency, frequency, pain with urination, burning sensation, blood in urine, odor or discharge. Musculoskeletal: Patient reports chronic back pain.  Denies decrease in range of motion, difficulty with gait, muscle pain or joint  swelling.  Skin: Denies redness, rashes, lesions or ulcercations.  Neurological: Denies dizziness, difficulty with memory, difficulty with speech or problems with balance and coordination.  Psych: Denies anxiety, depression, SI/HI.  No other specific complaints in a complete review of systems (except as listed in HPI above).      Objective:   Physical Exam  BP 118/82 (BP Location: Left Arm, Patient Position: Sitting, Cuff Size: Normal)   Ht 5' 6 (1.676 m)   Wt 183 lb 9.6 oz (83.3 kg)   BMI 29.63 kg/m    Wt Readings from Last 3 Encounters:  11/08/23 184 lb 6.4 oz (83.6 kg)  09/07/23 187 lb (84.8 kg)  08/31/23 186 lb 12.8 oz (84.7 kg)    General: Appears her stated age, overweight,  in NAD. HEENT: Head: normal shape and size; Throat/Mouth: Teeth missing, mucosa pink and moist, oral ulceration noted at roof of mouth.  Cardiovascular: Normal rate and rhythm.  Pulmonary/Chest: Normal effort and positive vesicular breath sounds. No respiratory distress. No wheezes, rales or ronchi noted.  Neurological: Alert and oriented.   BMET    Component Value Date/Time   NA 140 11/08/2023 1338   K 4.5 11/08/2023 1338   CL 103 11/08/2023 1338   CO2 31 11/08/2023 1338   GLUCOSE 106 (H) 11/08/2023 1338   BUN 7 11/08/2023 1338   CREATININE 0.79 11/08/2023 1338   CALCIUM 9.3 11/08/2023  1338   GFRNONAA >60 09/18/2018 1030   GFRAA >60  09/18/2018 1030    Lipid Panel     Component Value Date/Time   CHOL 135 05/08/2023 1123   TRIG 103 05/08/2023 1123   HDL 51 05/08/2023 1123   CHOLHDL 2.6 05/08/2023 1123   LDLCALC 65 05/08/2023 1123    CBC    Component Value Date/Time   WBC 7.1 11/08/2023 1338   RBC 4.96 11/08/2023 1338   HGB 10.8 (L) 11/08/2023 1338   HCT 36.4 11/08/2023 1338   PLT 261 11/08/2023 1338   MCV 73.4 (L) 11/08/2023 1338   MCH 21.8 (L) 11/08/2023 1338   MCHC 29.7 (L) 11/08/2023 1338   RDW 15.8 (H) 11/08/2023 1338   LYMPHSABS 2.4 06/05/2018 0608   MONOABS 0.5 06/05/2018 0608   EOSABS 0.1 06/05/2018 0608   BASOSABS 0.1 06/05/2018 0608    Hgb A1C Lab Results  Component Value Date   HGBA1C 6.8 (A) 05/08/2023            Assessment & Plan:  Assessment and Plan    Decreased appetite and unintentional weight loss Decreased appetite and weight loss of 16 pounds over the past year. Differential includes age-related changes, H. pylori infection, cancer, or mood issues. Previous H. pylori test incomplete. Denies anxiety or depression. Declined appetite-stimulating medications due to potential weight gain. Unable to afford nutritional supplements. - Order chest X-ray to rule out lung nodules. - Order HIV test for unintentional weight loss workup. - Will obtain H Pylori stool study. - Discussed that decrease in appetite is not that uncommon at her age.  Her body is expending less calories therefore she does not require as many calories however given her weight loss we would need to do her due diligence to rule out things like cancer and infection.  Overweight BMI 29.63, transitioned from obese to overweight due to weight loss.  Gastroesophageal reflux disease GERD managed with omeprazole  and famotidine . Famotidine  taken twice daily and as needed, resolves symptoms. - Continue current medications as previously prescribed        RTC in 3 weeks for your annual exam Angeline Laura, NP

## 2023-11-26 NOTE — Patient Instructions (Signed)
 High-Protein and High-Calorie Diet Eating high-protein and high-calorie foods can help you to gain weight, heal after an injury, and get better after an illness or surgery. The amount of daily protein and calories you need depends on: Your body weight. The reason you were told to follow this diet. Usually, a high-protein, high-calorie diet means that you should: Eat 250-500 extra calories each day. Make sure that you get enough of your daily calories from protein. Ask your health care provider how many of your calories should come from protein and how many calories total you need each day. Follow the diet as told by your provider. What are tips for following this plan? Reading food labels Check the nutrition facts label for calories, and grams of fat and protein. Items with more than 4 grams of protein are high-protein foods. General information  Ask your provider if you should take a nutritional supplement. Try to eat six small meals each day instead of three large meals. A goal is usually to eat every 2 to 3 hours. Eat a balanced diet. In each meal, include one food that's high in protein and one food with fat in it. Keep nutritious snacks available, such as nuts, trail mixes, dried fruit, and whole-milk yogurt. If you have kidney disease or diabetes, talk with your provider about how much protein is safe for you. Too much protein may put extra stress on your kidneys. Replace zero-calorie drinks with drinks that have calories in them, such as milk and 100% fruit juice. Consider setting a timer to remind you to eat. You'll want to eat even if you do not feel very hungry. Preparing meals Milk and dairy foods. Add whole milk, half-and-half, or heavy cream to cereal, pudding, soup, or hot cocoa. Add whole milk to instant breakfast drinks. Add powdered milk to baked goods, smoothies, or milkshakes. Add powdered milk, cream, or butter to mashed potatoes. Replace water with milk or heavy cream  when making foods such as oatmeal, pudding, or cocoa. Make cream-based pastas and soups. Add cheese to cooked vegetables. Make whole-milk yogurt parfaits. Top them with granola, fruit, or nuts. Add cottage cheese to fruit. Add cream cheese to sandwiches or as a topping on crackers and bread. Eggs. Add hard-boiled eggs to salads. Keep hard-boiled eggs in the fridge to snack on. Add cheese to cooked eggs. Beans, nuts, and seeds. Add peanut butter to oatmeal or smoothies. Use peanut butter as a dip for fruits and vegetables or as a topping for pretzels, celery, or crackers. Add beans to casseroles, dips, and spreads. Add pureed beans to sauces and soups. Salads, soups, and other foods. Add avocado, cheese, or both to sandwiches or salads. Add avocado to smoothies. Add meat, poultry, or seafood to rice, pasta, casseroles, salads, and soups. Use mayonnaise when making egg salad, chicken salad, or tuna salad. Add oil or butter to cooked vegetables and grains. What high-protein foods should I eat?  Vegetables Soybeans. Peas. Grains Quinoa. Bulgur wheat. Buckwheat. Meats and other proteins Beef, pork, and poultry. Fish and seafood. Eggs. Tofu. Textured vegetable protein (TVP). Peanut butter. Nuts and seeds. Dried beans. Protein powders. Hummus. Jerky. Dairy Whole milk. Whole-milk yogurt. Powdered milk. Cheese. Cottage cheese. Eggnog. Beverages High-protein supplement drinks. Soy milk. Other foods Protein bars. The items listed above may not be all the foods and drinks you can have. Talk with an expert in healthy eating called a dietitian to learn more. What high-calorie foods should I eat? Fruits Dried fruit. Fruit leather. Canned  fruit in syrup. Fruit juice. Avocado. Vegetables Vegetables cooked in oil or butter. Fried potatoes. Grains Pasta. Quick breads. Muffins. Pancakes. Granola. Meats and other proteins Peanut butter and other nut butters. Nuts and seeds. Dairy Heavy cream.  Whipped cream. Cream cheese. Sour cream. Ice cream. Custard. Pudding. Whole-milk dairy products. Beverages Meal-replacement beverages. Nutrition shakes. Fruit juice. Seasonings and condiments Salad dressing. Mayonnaise. Alfredo sauce. Fruit preserves or jelly. Honey. Syrup. Sweets and desserts Cake. Cookies. Pie. Pastries. Candy bars. Chocolate. Fats and oils Butter or margarine. Oil. Gravy. Other foods Meal-replacement bars. The items listed above may not be all the foods and drinks you can have. Talk with an expert in healthy eating to learn more. This information is not intended to replace advice given to you by your health care provider. Make sure you discuss any questions you have with your health care provider. Document Revised: 09/04/2022 Document Reviewed: 09/04/2022 Elsevier Patient Education  2024 [employer].

## 2023-11-27 LAB — HIV ANTIBODY (ROUTINE TESTING W REFLEX): HIV 1&2 Ab, 4th Generation: NONREACTIVE

## 2023-12-01 ENCOUNTER — Other Ambulatory Visit: Payer: Self-pay | Admitting: Podiatry

## 2023-12-01 ENCOUNTER — Other Ambulatory Visit: Payer: Self-pay | Admitting: Internal Medicine

## 2023-12-03 ENCOUNTER — Other Ambulatory Visit: Payer: Self-pay | Admitting: Internal Medicine

## 2023-12-03 ENCOUNTER — Ambulatory Visit: Payer: Self-pay | Admitting: Internal Medicine

## 2023-12-03 NOTE — Telephone Encounter (Signed)
 Copied from CRM [id number]. Topic: Clinical - Medication Refill >> Dec 03, 2023  1:28 PM Stacy Cunningham wrote: Medication:  metFORMIN  (GLUCOPHAGE ) 500 MG tablet gabapentin  (NEURONTIN ) 800 MG tablet clotrimazole -betamethasone  (LOTRISONE ) cream  Has the patient contacted their pharmacy? Yes (Agent: If no, request that the patient contact the pharmacy for the refill. If patient does not wish to contact the pharmacy document the reason why and proceed with request.) (Agent: If yes, when and what did the pharmacy advise?) Pharmacy needs orders to refill   This is the patient's preferred pharmacy:  [facility] - [place], [place] - [address] [place]acy [place] [address] Phone: [phone] Fax: [phone]   Is this the correct pharmacy for this prescription? Yes If no, delete pharmacy and type the correct one.   Has the prescription been filled recently? No  Is the patient out of the medication? Yes She has been out since 11/30/23  Has the patient been seen for an appointment in the last year OR does the patient have an upcoming appointment? Yes  Can we respond through MyChart? Yes  Agent: Please be advised that Rx refills may take up to 3 business days. We ask that you follow-up with your pharmacy.

## 2023-12-05 ENCOUNTER — Telehealth: Payer: Self-pay

## 2023-12-05 ENCOUNTER — Telehealth: Payer: Self-pay | Admitting: Podiatry

## 2023-12-05 ENCOUNTER — Telehealth: Payer: Self-pay | Admitting: Lab

## 2023-12-05 ENCOUNTER — Other Ambulatory Visit: Payer: Self-pay | Admitting: Lab

## 2023-12-05 MED ORDER — GABAPENTIN 800 MG PO TABS: ORAL_TABLET | ORAL | 0 refills | Status: DC

## 2023-12-05 NOTE — Telephone Encounter (Signed)
Medication refilled for 30 day supply only

## 2023-12-05 NOTE — Telephone Encounter (Signed)
 Patient is need of refill on her gabapentin  was advised to schedule appointment due to no current visit on file and last appointment she had with Dr. Artist she was a no show. She has scheduled with Dr.Patel for next week and wants you to refill her medication.

## 2023-12-05 NOTE — Telephone Encounter (Signed)
 Requested medication (s) are due for refill today: yes  Requested medication (s) are on the active medication list: yes  Last refill:  09/03/23  Future visit scheduled: yes  Notes to clinic:  Medication not assigned to a protocol, review manually.      Requested Prescriptions  Pending Prescriptions Disp Refills   clotrimazole -betamethasone  (LOTRISONE ) cream [Pharmacy Med Name: CLOTRIMAZOLE -BETAMETHASONE  1-0.05%] 45 g 0    Sig: APPLY TOPICALLY TO AFFECTED AREAS 2 TIMES DAILY     Off-Protocol Failed - 12/05/2023  8:59 AM      Failed - Medication not assigned to a protocol, review manually.      Passed - Valid encounter within last 12 months    Recent Outpatient Visits           1 week ago Unintentional weight loss   [place] [facility] [place], [place], NP   3 weeks ago Suprapubic pain   [place] [facility] [place], Angeline ORN, NP   3 months ago Oral ulcer   [place] [facility] [place], [place], NP   3 months ago BPPV (benign paroxysmal positional vertigo), bilateral   [place] [facility] [place], Marsa PARAS, DO   3 months ago Benign essential hypertension   [place] [facility] [place], Angeline ORN, NP       Future Appointments             In 5 days [place], Angeline ORN, NP [facility] [place], [place]            Signed Prescriptions Disp Refills   metFORMIN  (GLUCOPHAGE ) 500 MG tablet 270 tablet 0    Sig: TAKE 2 TABLETS BY MOUTH DAILY WITH BREAKFAST AND 1 TABLET IN THE EVENING     Endocrinology:  Diabetes - Biguanides Failed - 12/05/2023  8:59 AM      Failed - HBA1C is between 0 and 7.9 and within 180 days    Hemoglobin A1C  Date Value Ref Range Status  05/08/2023 6.8 (A) 4.0 - 5.6 % Final   HbA1c, POC (controlled diabetic range)  Date Value Ref Range Status  12/07/2022 7.4 (A) 0.0 - 7.0 % Final         Failed - B12 Level in normal range and within 720  days    No results found for: VITAMINB12       Failed - CBC within normal limits and completed in the last 12 months    WBC  Date Value Ref Range Status  11/08/2023 7.1 3.8 - 10.8 Thousand/uL Final   RBC  Date Value Ref Range Status  11/08/2023 4.96 3.80 - 5.10 Million/uL Final   Hemoglobin  Date Value Ref Range Status  11/08/2023 10.8 (L) 11.7 - 15.5 g/dL Final   HCT  Date Value Ref Range Status  11/08/2023 36.4 35.0 - 45.0 % Final   MCHC  Date Value Ref Range Status  11/08/2023 29.7 (L) 32.0 - 36.0 g/dL Final    Comment:    For adults, a slight decrease in the calculated MCHC value (in the range of 30 to 32 g/dL) is most likely not clinically significant; however, it should be interpreted with caution in correlation with other red cell parameters and the patient's clinical condition.    [facility]  Date Value Ref Range Status  11/08/2023 21.8 (L) 27.0 - 33.0 pg Final   MCV  Date Value Ref Range Status  11/08/2023  73.4 (L) 80.0 - 100.0 fL Final   No results found for: PLTCOUNTKUC, LABPLAT, POCPLA RDW  Date Value Ref Range Status  11/08/2023 15.8 (H) 11.0 - 15.0 % Final         Passed - Cr in normal range and within 360 days    Creat  Date Value Ref Range Status  11/08/2023 0.79 0.60 - 1.00 mg/dL Final   Creatinine, Urine  Date Value Ref Range Status  05/08/2023 147 20 - 275 mg/dL Final         Passed - eGFR in normal range and within 360 days    GFR calc Af Amer  Date Value Ref Range Status  09/18/2018 >60 >60 mL/min Final   GFR calc non Af Amer  Date Value Ref Range Status  09/18/2018 >60 >60 mL/min Final   eGFR  Date Value Ref Range Status  11/08/2023 80 > OR = 60 mL/min/1.2m2 Final         Passed - Valid encounter within last 6 months    Recent Outpatient Visits           1 week ago Unintentional weight loss   [place] [place], Angeline ORN, NP   3 weeks ago Suprapubic pain   [facility] [place], Angeline ORN, NP   3 months ago Oral ulcer   [place] [facility] [place], [place], NP   3 months ago BPPV (benign paroxysmal positional vertigo), bilateral   [place] [facility] [place], Marsa PARAS, DO   3 months ago Benign essential hypertension   [place] [facility] [place], Angeline ORN, NP       Future Appointments             In 5 days [place], Angeline ORN, NP [place] [facility]

## 2023-12-05 NOTE — Telephone Encounter (Signed)
 Called patient asked to schedule appointment since she hasn't been seen in office in some time and her last appointment was a no show.

## 2023-12-05 NOTE — Telephone Encounter (Signed)
 Patient called stating she needs a refill on her RX. Patient states she is out of it.

## 2023-12-05 NOTE — Telephone Encounter (Signed)
 Requested Prescriptions  Pending Prescriptions Disp Refills   metFORMIN  (GLUCOPHAGE ) 500 MG tablet [Pharmacy Med Name: METFORMIN  HCL 500 MG TAB] 270 tablet 0    Sig: TAKE 2 TABLETS BY MOUTH DAILY WITH BREAKFAST AND 1 TABLET IN THE EVENING     Endocrinology:  Diabetes - Biguanides Failed - 12/05/2023  8:34 AM      Failed - HBA1C is between 0 and 7.9 and within 180 days    Hemoglobin A1C  Date Value Ref Range Status  05/08/2023 6.8 (A) 4.0 - 5.6 % Final   HbA1c, POC (controlled diabetic range)  Date Value Ref Range Status  12/07/2022 7.4 (A) 0.0 - 7.0 % Final         Failed - B12 Level in normal range and within 720 days    No results found for: VITAMINB12       Failed - CBC within normal limits and completed in the last 12 months    WBC  Date Value Ref Range Status  11/08/2023 7.1 3.8 - 10.8 Thousand/uL Final   RBC  Date Value Ref Range Status  11/08/2023 4.96 3.80 - 5.10 Million/uL Final   Hemoglobin  Date Value Ref Range Status  11/08/2023 10.8 (L) 11.7 - 15.5 g/dL Final   HCT  Date Value Ref Range Status  11/08/2023 36.4 35.0 - 45.0 % Final   MCHC  Date Value Ref Range Status  11/08/2023 29.7 (L) 32.0 - 36.0 g/dL Final    Comment:    For adults, a slight decrease in the calculated MCHC value (in the range of 30 to 32 g/dL) is most likely not clinically significant; however, it should be interpreted with caution in correlation with other red cell parameters and the patient's clinical condition.    [facility]  Date Value Ref Range Status  11/08/2023 21.8 (L) 27.0 - 33.0 pg Final   MCV  Date Value Ref Range Status  11/08/2023 73.4 (L) 80.0 - 100.0 fL Final   No results found for: PLTCOUNTKUC, LABPLAT, POCPLA RDW  Date Value Ref Range Status  11/08/2023 15.8 (H) 11.0 - 15.0 % Final         Passed - Cr in normal range and within 360 days    Creat  Date Value Ref Range Status  11/08/2023 0.79 0.60 - 1.00 mg/dL Final   Creatinine, Urine  Date Value Ref  Range Status  05/08/2023 147 20 - 275 mg/dL Final         Passed - eGFR in normal range and within 360 days    GFR calc Af Amer  Date Value Ref Range Status  09/18/2018 >60 >60 mL/min Final   GFR calc non Af Amer  Date Value Ref Range Status  09/18/2018 >60 >60 mL/min Final   eGFR  Date Value Ref Range Status  11/08/2023 80 > OR = 60 mL/min/1.71m2 Final         Passed - Valid encounter within last 6 months    Recent Outpatient Visits           1 week ago Unintentional weight loss   [place] [facility] [place], Angeline ORN, NP   3 weeks ago Suprapubic pain   [place] [facility] [place], Angeline ORN, NP   3 months ago Oral ulcer   [place] [facility] [place], [place] W, NP   3 months ago BPPV (benign paroxysmal positional vertigo), bilateral   [place] [facility] [place],  Marsa PARAS, DO   3 months ago Benign essential hypertension   [place], Angeline ORN, NP       Future Appointments             In 5 days [place], Angeline ORN, NP [place] [facility]             clotrimazole -betamethasone  (LOTRISONE ) cream [Pharmacy Med Name: CLOTRIMAZOLE -BETAMETHASONE  1-0.05%] 45 g 0    Sig: APPLY TOPICALLY TO AFFECTED AREAS 2 TIMES DAILY     Off-Protocol Failed - 12/05/2023  8:34 AM      Failed - Medication not assigned to a protocol, review manually.      Passed - Valid encounter within last 12 months    Recent Outpatient Visits           1 week ago Unintentional weight loss   [place] [facility] [place], Angeline ORN, NP   3 weeks ago Suprapubic pain   [place] [facility] [place], Angeline ORN, NP   3 months ago Oral ulcer   [place] [facility] [place], [place] W, NP   3 months ago BPPV (benign paroxysmal positional vertigo), bilateral   [place] [facility] [place], Marsa PARAS,  DO   3 months ago Benign essential hypertension   [place] [facility] [place], Angeline ORN, NP       Future Appointments             In 5 days [place], Angeline ORN, NP  [facility], [facility]

## 2023-12-05 NOTE — Telephone Encounter (Signed)
 Copied from CRM #[id number]. Topic: Clinical - Prescription Issue >> Dec 05, 2023  1:34 PM Sasha H wrote: Reason for CRM: Pt states she has ran out of the gabapentin  (NEURONTIN ) 800 MG tablet that was given to her until her next appointment and she needs another filler until she is seen.

## 2023-12-05 NOTE — Telephone Encounter (Signed)
 Requested medication (s) are due for refill today: yes  Requested medication (s) are on the active medication list: yes  Last refill:  09/03/23  Future visit scheduled: yes  Notes to clinic:  Medication not assigned to a protocol, review manually.      Requested Prescriptions  Pending Prescriptions Disp Refills   clotrimazole -betamethasone  (LOTRISONE ) cream [Pharmacy Med Name: CLOTRIMAZOLE -BETAMETHASONE  1-0.05%] 45 g 0    Sig: APPLY TOPICALLY TO AFFECTED AREAS 2 TIMES DAILY     Off-Protocol Failed - 12/05/2023  8:59 AM      Failed - Medication not assigned to a protocol, review manually.      Passed - Valid encounter within last 12 months    Recent Outpatient Visits           1 week ago Unintentional weight loss   [place] [facility] [place], [place], NP   3 weeks ago Suprapubic pain   [place] [place], Angeline ORN, NP   3 months ago Oral ulcer   [place] [facility] [place], [place], NP   3 months ago BPPV (benign paroxysmal positional vertigo), bilateral   [place] [facility] [place], Marsa PARAS, DO   3 months ago Benign essential hypertension   [place] [facility] [place], Angeline ORN, NP       Future Appointments             In 5 days [place], Angeline ORN, NP [place] [facility]            Signed Prescriptions Disp Refills   metFORMIN  (GLUCOPHAGE ) 500 MG tablet 270 tablet 0    Sig: TAKE 2 TABLETS BY MOUTH DAILY WITH BREAKFAST AND 1 TABLET IN THE EVENING     Endocrinology:  Diabetes - Biguanides Failed - 12/05/2023  8:59 AM      Failed - HBA1C is between 0 and 7.9 and within 180 days    Hemoglobin A1C  Date Value Ref Range Status  05/08/2023 6.8 (A) 4.0 - 5.6 % Final   HbA1c, POC (controlled diabetic range)  Date Value Ref Range Status  12/07/2022 7.4 (A) 0.0 - 7.0 % Final         Failed - B12 Level in normal range and within 720  days    No results found for: VITAMINB12       Failed - CBC within normal limits and completed in the last 12 months    WBC  Date Value Ref Range Status  11/08/2023 7.1 3.8 - 10.8 Thousand/uL Final   RBC  Date Value Ref Range Status  11/08/2023 4.96 3.80 - 5.10 Million/uL Final   Hemoglobin  Date Value Ref Range Status  11/08/2023 10.8 (L) 11.7 - 15.5 g/dL Final   HCT  Date Value Ref Range Status  11/08/2023 36.4 35.0 - 45.0 % Final   MCHC  Date Value Ref Range Status  11/08/2023 29.7 (L) 32.0 - 36.0 g/dL Final    Comment:    For adults, a slight decrease in the calculated MCHC value (in the range of 30 to 32 g/dL) is most likely not clinically significant; however, it should be interpreted with caution in correlation with other red cell parameters and the patient's clinical condition.    [facility]  Date Value Ref Range Status  11/08/2023 21.8 (L) 27.0 - 33.0 pg Final   MCV  Date Value Ref Range Status  11/08/2023  73.4 (L) 80.0 - 100.0 fL Final   No results found for: PLTCOUNTKUC, LABPLAT, POCPLA RDW  Date Value Ref Range Status  11/08/2023 15.8 (H) 11.0 - 15.0 % Final         Passed - Cr in normal range and within 360 days    Creat  Date Value Ref Range Status  11/08/2023 0.79 0.60 - 1.00 mg/dL Final   Creatinine, Urine  Date Value Ref Range Status  05/08/2023 147 20 - 275 mg/dL Final         Passed - eGFR in normal range and within 360 days    GFR calc Af Amer  Date Value Ref Range Status  09/18/2018 >60 >60 mL/min Final   GFR calc non Af Amer  Date Value Ref Range Status  09/18/2018 >60 >60 mL/min Final   eGFR  Date Value Ref Range Status  11/08/2023 80 > OR = 60 mL/min/1.6m2 Final         Passed - Valid encounter within last 6 months    Recent Outpatient Visits           1 week ago Unintentional weight loss   [place] [facility] [place], Angeline ORN, NP   3 weeks ago Suprapubic pain   [place] [facility] [place], Angeline ORN, NP   3 months ago Oral ulcer   [place] [facility] [place], [place] W, NP   3 months ago BPPV (benign paroxysmal positional vertigo), bilateral   [place] [facility] [place], Marsa PARAS, DO   3 months ago Benign essential hypertension   [place] [facility] [place], Angeline ORN, NP       Future Appointments             In 5 days [place], Angeline ORN, NP [place] [facility], [facility]

## 2023-12-06 ENCOUNTER — Ambulatory Visit
Admission: RE | Admit: 2023-12-06 | Discharge: 2023-12-06 | Disposition: A | Discharge status: Home / Self Care | Source: Ambulatory Visit | Attending: Internal Medicine | Admitting: Internal Medicine

## 2023-12-06 DIAGNOSIS — Z1231 Encounter for screening mammogram for malignant neoplasm of breast: Secondary | ICD-10-CM | POA: Diagnosis present

## 2023-12-06 NOTE — Telephone Encounter (Signed)
 It looks like this was filled yesterday by Dr. Verta

## 2023-12-06 NOTE — Telephone Encounter (Signed)
 Duplicate request, refilled 12/05/23.  Requested Prescriptions  Pending Prescriptions Disp Refills   clotrimazole -betamethasone  (LOTRISONE ) cream 45 g 0     Off-Protocol Failed - 12/06/2023  1:59 PM      Failed - Medication not assigned to a protocol, review manually.      Passed - Valid encounter within last 12 months    Recent Outpatient Visits           1 week ago Unintentional weight loss   [place] [facility] [place], [place], NP   4 weeks ago Suprapubic pain   [place] [facility] [place], Angeline ORN, NP   3 months ago Oral ulcer   [place] [facility] [place], [place], NP   3 months ago BPPV (benign paroxysmal positional vertigo), bilateral   [place] [facility] [place], Marsa PARAS, DO   3 months ago Benign essential hypertension   [place] [facility] [place], Angeline ORN, NP       Future Appointments             In 4 days [place], Angeline ORN, NP [facility]             metFORMIN  (GLUCOPHAGE ) 500 MG tablet 270 tablet 0     Endocrinology:  Diabetes - Biguanides Failed - 12/06/2023  1:59 PM      Failed - HBA1C is between 0 and 7.9 and within 180 days    Hemoglobin A1C  Date Value Ref Range Status  05/08/2023 6.8 (A) 4.0 - 5.6 % Final   HbA1c, POC (controlled diabetic range)  Date Value Ref Range Status  12/07/2022 7.4 (A) 0.0 - 7.0 % Final         Failed - B12 Level in normal range and within 720 days    No results found for: VITAMINB12       Failed - CBC within normal limits and completed in the last 12 months    WBC  Date Value Ref Range Status  11/08/2023 7.1 3.8 - 10.8 Thousand/uL Final   RBC  Date Value Ref Range Status  11/08/2023 4.96 3.80 - 5.10 Million/uL Final   Hemoglobin  Date Value Ref Range Status  11/08/2023 10.8 (L) 11.7 - 15.5 g/dL Final   HCT  Date Value Ref Range Status  11/08/2023 36.4 35.0 - 45.0 % Final    MCHC  Date Value Ref Range Status  11/08/2023 29.7 (L) 32.0 - 36.0 g/dL Final    Comment:    For adults, a slight decrease in the calculated MCHC value (in the range of 30 to 32 g/dL) is most likely not clinically significant; however, it should be interpreted with caution in correlation with other red cell parameters and the patient's clinical condition.    [facility]  Date Value Ref Range Status  11/08/2023 21.8 (L) 27.0 - 33.0 pg Final   MCV  Date Value Ref Range Status  11/08/2023 73.4 (L) 80.0 - 100.0 fL Final   No results found for: PLTCOUNTKUC, LABPLAT, POCPLA RDW  Date Value Ref Range Status  11/08/2023 15.8 (H) 11.0 - 15.0 % Final         Passed - Cr in normal range and within 360 days    Creat  Date Value Ref Range Status  11/08/2023 0.79 0.60 - 1.00 mg/dL Final   Creatinine, Urine  Date Value Ref Range Status  05/08/2023 147 20 -  275 mg/dL Final         Passed - eGFR in normal range and within 360 days    GFR calc Af Amer  Date Value Ref Range Status  09/18/2018 >60 >60 mL/min Final   GFR calc non Af Amer  Date Value Ref Range Status  09/18/2018 >60 >60 mL/min Final   eGFR  Date Value Ref Range Status  11/08/2023 80 > OR = 60 mL/min/1.52m2 Final         Passed - Valid encounter within last 6 months    Recent Outpatient Visits           1 week ago Unintentional weight loss   [place] [facility] [place], Angeline ORN, NP   4 weeks ago Suprapubic pain   [place] [facility] [place], Angeline ORN, NP   3 months ago Oral ulcer   [place] [facility] [place], [place] W, NP   3 months ago BPPV (benign paroxysmal positional vertigo), bilateral   [place] [facility] [place], Marsa PARAS, DO   3 months ago Benign essential hypertension   [place] [facility] [place], Angeline ORN, NP       Future Appointments             In 4 days [place], Angeline ORN, NP [place]  [facility]             gabapentin  (NEURONTIN ) 800 MG tablet 360 tablet 3    Sig: Take one tablet AM, one PM, two QHS     Neurology: Anticonvulsants - gabapentin  Passed - 12/06/2023  1:59 PM      Passed - Cr in normal range and within 360 days    Creat  Date Value Ref Range Status  11/08/2023 0.79 0.60 - 1.00 mg/dL Final   Creatinine, Urine  Date Value Ref Range Status  05/08/2023 147 20 - 275 mg/dL Final         Passed - Completed PHQ-2 or PHQ-9 in the last 360 days      Passed - Valid encounter within last 12 months    Recent Outpatient Visits           1 week ago Unintentional weight loss   [place] [facility] [place], Angeline ORN, NP   4 weeks ago Suprapubic pain   [place] [facility] [place], Angeline ORN, NP   3 months ago Oral ulcer   [place] [facility] [place], [place] W, NP   3 months ago BPPV (benign paroxysmal positional vertigo), bilateral    [facility] [place], Marsa PARAS, DO   3 months ago Benign essential hypertension    [facility] [place], Angeline ORN, NP       Future Appointments             In 4 days [place], Angeline ORN, NP  [facility], [facility]

## 2023-12-07 ENCOUNTER — Telehealth: Payer: Self-pay | Admitting: Podiatry

## 2023-12-07 NOTE — Telephone Encounter (Signed)
 Patient reports that the current dose of gabapentin  (Neurontin ) 800 mg tablet causes excessive drowsiness that is concerning to her. She states she is accustomed to taking a 500 mg dose and tolerates it better.  She is requesting a dosage adjustment to 500 mg and would like the updated prescription to be sent to the same pharmacy.

## 2023-12-10 ENCOUNTER — Encounter: Admitting: Internal Medicine

## 2023-12-10 ENCOUNTER — Ambulatory Visit: Payer: Self-pay | Admitting: Internal Medicine

## 2023-12-11 ENCOUNTER — Ambulatory Visit: Admitting: Podiatry

## 2023-12-20 ENCOUNTER — Telehealth: Payer: Self-pay | Admitting: Internal Medicine

## 2023-12-20 NOTE — Telephone Encounter (Unsigned)
 Copied from CRM #[id number]. Topic: General - Other >> Dec 20, 2023  3:50 PM Tiffini S wrote: Reason for CRM: Patient called about a Medicaid [facility] 3051 form that needs to be completed by the provider and faxed to [facility] [phone] for more hours for her personal care. Need to complete a medical change of status and send in to [facility].  Need 30 to 40 hours every two weeks. Need as soon as possible.   Please call the patient to follow up with update at [phone].

## 2023-12-21 ENCOUNTER — Telehealth: Payer: Self-pay

## 2023-12-21 NOTE — Telephone Encounter (Signed)
 Spoke with patient, notified she will have to get the form and have it faxed to us  or drop one off to be completed. She stated she would contact [facility] and have it sent

## 2023-12-21 NOTE — Telephone Encounter (Signed)
 Copied from CRM [id number]. Topic: Referral - Question >> Dec 21, 2023 10:27 AM Nathanel BROCKS wrote: Reason for CRM: pt needs 30-40 hrs of personal care aide every 2 weeks. Please advise.

## 2023-12-21 NOTE — Telephone Encounter (Signed)
 Note, I do not have this form.  She will need to provide it

## 2023-12-21 NOTE — Telephone Encounter (Signed)
 She currently getting personal care services?  This form is complicated and something I am never filled out before.  She is going to need to make an appointment for us  to discuss this and fill out the form together.

## 2023-12-21 NOTE — Telephone Encounter (Signed)
 Spoke to patient appointment scheduled for Tuesday

## 2023-12-25 ENCOUNTER — Ambulatory Visit (INDEPENDENT_AMBULATORY_CARE_PROVIDER_SITE_OTHER): Admitting: Internal Medicine

## 2023-12-25 ENCOUNTER — Other Ambulatory Visit: Payer: Self-pay | Admitting: Internal Medicine

## 2023-12-25 ENCOUNTER — Encounter: Payer: Self-pay | Admitting: Internal Medicine

## 2023-12-25 VITALS — BP 112/72 | Ht 66.0 in | Wt 179.4 lb

## 2023-12-25 DIAGNOSIS — Z0289 Encounter for other administrative examinations: Secondary | ICD-10-CM | POA: Diagnosis not present

## 2023-12-25 DIAGNOSIS — M48062 Spinal stenosis, lumbar region with neurogenic claudication: Secondary | ICD-10-CM | POA: Diagnosis not present

## 2023-12-25 DIAGNOSIS — G629 Polyneuropathy, unspecified: Secondary | ICD-10-CM | POA: Diagnosis not present

## 2023-12-25 NOTE — Patient Instructions (Signed)
 Sciatica  Sciatica is pain, weakness, tingling, or loss of feeling (numbness) along the sciatic nerve. The sciatic nerve starts in the lower back and goes down the back of each leg. Sciatica usually affects one side of the body. Sciatica usually goes away on its own or with treatment. Sometimes, sciatica may come back. What are the causes? This condition happens when the sciatic nerve is pinched or has pressure put on it. This may be caused by: A disk in between the bones of the spine bulging out too far (herniated disk). Changes in the spinal disks due to aging. A condition that affects a muscle in the butt. Extra bone growth near the sciatic nerve. A break (fracture) of the area between your hip bones (pelvis). Pregnancy. Tumor. This is rare. What increases the risk? You are more likely to develop this condition if you: Play sports that put pressure or stress on the spine. Have poor strength and ease of movement (flexibility). Have had a back injury or back surgery. Sit for long periods of time. Do activities that involve bending or lifting over and over again. Are very overweight (obese). What are the signs or symptoms? Symptoms can vary from mild to very bad. They may include: Any of these problems in the lower back, leg, hip, or butt: Mild tingling, loss of feeling, or dull aches. A burning feeling. Sharp pains. Loss of feeling in the back of the calf or the sole of the foot. Leg weakness. Very bad back pain that makes it hard to move. These symptoms may get worse when you cough, sneeze, or laugh. They may also get worse when you sit or stand for long periods of time. How is this treated? This condition often gets better without any treatment. However, treatment may include: Changing or cutting back on physical activity when you have pain. Exercising, including strengthening and stretching. Putting ice or heat on the affected area. Shots of medicines to relieve pain and  swelling or to relax your muscles. Surgery. Follow these instructions at home: Medicines Take over-the-counter and prescription medicines only as told by your doctor. Ask your doctor if you should avoid driving or using machines while you are taking your medicine. Managing pain     If told, put ice on the affected area. To do this: Put ice in a plastic bag. Place a towel between your skin and the bag. Leave the ice on for 20 minutes, 2-3 times a day. If your skin turns bright red, take off the ice right away to prevent skin damage. The risk of skin damage is higher if you cannot feel pain, heat, or cold. If told, put heat on the affected area. Do this as often as told by your doctor. Use the heat source that your doctor tells you to use, such as a moist heat pack or a heating pad. Place a towel between your skin and the heat source. Leave the heat on for 20-30 minutes. If your skin turns bright red, take off the heat right away to prevent burns. The risk of burns is higher if you cannot feel pain, heat, or cold. Activity  Return to your normal activities when your doctor says that it is safe. Avoid activities that make your symptoms worse. Take short rests during the day. When you rest for a long time, do some physical activity or stretching between periods of rest. Avoid sitting for a long time without moving. Get up and move around at least one time each  hour. Do exercises and stretches as told by your doctor. Do not lift anything that is heavier than 10 lb (4.5 kg). Avoid lifting heavy things even when you do not have symptoms. Avoid lifting heavy things over and over. When you lift objects, always lift in a way that is safe for your body. To do this, you should: Bend your knees. Keep the object close to your body. Avoid twisting. General instructions Stay at a healthy weight. Wear comfortable shoes that support your feet. Avoid wearing high heels. Avoid sleeping on a mattress  that is too soft or too hard. You might have less pain if you sleep on a mattress that is firm enough to support your back. Contact a doctor if: Your pain is not controlled by medicine. Your pain does not get better. Your pain gets worse. Your pain lasts longer than 4 weeks. You lose weight without trying. Get help right away if: You cannot control when you pee (urinate) or poop (have a bowel movement). You have weakness in any of these areas and it gets worse: Lower back. The area between your hip bones. Butt. Legs. You have redness or swelling of your back. You have a burning feeling when you pee. Summary Sciatica is pain, weakness, tingling, or loss of feeling (numbness) along the sciatic nerve. This may include the lower back, legs, hips, and butt. This condition happens when the sciatic nerve is pinched or has pressure put on it. Treatment often includes rest, exercise, medicines, and putting ice or heat on the affected area. This information is not intended to replace advice given to you by your health care provider. Make sure you discuss any questions you have with your health care provider. Document Revised: 07/18/2021 Document Reviewed: 07/18/2021 Elsevier Patient Education  2024 [employer].

## 2023-12-25 NOTE — Progress Notes (Signed)
 HPI  Discussed the use of AI scribe software for clinical note transcription with the patient, who gave verbal consent to proceed.  Stacy Cunningham is a 73 year old female with chronic polyneuropathy and spinal stenosis who presents for assistance with a change of status medical form.  She has experienced chronic polyneuropathy and spinal stenosis since 2003, resulting in persistent back pain and stiffness. The pain fluctuates throughout the day, worsening at night. She reports numbness and tingling in her legs, feet, hands, and buttocks, along with leg weakness, necessitating the use of a cane for ambulation.  She has not undergone back surgery but is considering it in the future. She was told that her condition would be with her for the rest of her life. She requires assistance with daily activities, such as getting in and out of the tub due to fear of slipping and falling, and fastening her bra due to morning stiffness.  She currently receives assistance from a home health aide for household chores and personal care tasks, totaling approximately 18 hours per week. She is requesting an increase to 40 hours every two weeks. Home modifications, including a ramp and shower adjustments, have been made to accommodate her physical needs.    Past Medical History:  Diagnosis Date   Anemia    Borderline diabetes mellitus    Diabetes mellitus without complication (HCC)    Elevated lipids    GERD (gastroesophageal reflux disease)    Glaucoma    Headache    h/o migraines   Hypertension    Neuropathy    Palpitations    a. 05/2020 Zio: Predominantly sinus rhythm, 80 (60-148).  1 SVT run-4 beats at 148.  Rare PACs and PVCs; b. 04/2021 Zio: Predominantly sinus rhythm, 80 (59-140).  Rare PACs/PVCs.   Panic attack     Current Outpatient Medications  Medication Sig Dispense Refill   aspirin 81 MG EC tablet Take 81 mg by mouth daily.     brimonidine (ALPHAGAN) 0.15 % ophthalmic solution Place 1 drop into  both eyes 3 (three) times daily.     butalbital-acetaminophen -caffeine (FIORICET) 50-325-40 MG tablet Take 1 tablet by mouth every 4 (four) hours as needed.     clobetasol ointment (TEMOVATE) 0.05 % Apply 1 Application topically.     clotrimazole -betamethasone  (LOTRISONE ) cream APPLY TOPICALLY TO AFFECTED AREAS 2 TIMES DAILY 45 g 0   dorzolamide-timolol  (COSOPT) 22.3-6.8 MG/ML ophthalmic solution Place 1 drop into both eyes at bedtime.     famotidine  (PEPCID ) 40 MG tablet Take 1 tablet (40 mg total) by mouth daily. 90 tablet 1   ferrous sulfate  325 (65 FE) MG tablet Take 1 tablet (325 mg total) by mouth 2 (two) times daily with a meal. 180 tablet 1   fexofenadine  (ALLEGRA  ALLERGY) 180 MG tablet Take 1 tablet (180 mg total) by mouth daily. 90 tablet 0   fluticasone  (FLONASE ) 50 MCG/ACT nasal spray Place 2 sprays into both nostrils daily. Use for 4-6 weeks then stop and use seasonally or as needed. 16 g 3   gabapentin  (NEURONTIN ) 800 MG tablet Take one tablet AM, one PM, two QHS 120 tablet 0   hydrochlorothiazide  (HYDRODIURIL ) 25 MG tablet TAKE 1 TABLET BY MOUTH DAILY 90 tablet 1   lidocaine  (LIDODERM ) 5 % Place 1 patch onto the skin daily. Remove & Discard patch within 12 hours or as directed by MD 90 patch 1   lisinopril  (ZESTRIL ) 2.5 MG tablet TAKE 1 TABLET BY MOUTH DAILY FOR KIDNEY PROTECTION 90 tablet  1   Magnesium  400 MG CAPS Take 1 capsule by mouth daily at 12 noon. 90 capsule 0   metFORMIN  (GLUCOPHAGE ) 500 MG tablet TAKE 2 TABLETS BY MOUTH DAILY WITH BREAKFAST AND 1 TABLET IN THE EVENING 270 tablet 0   naproxen  (NAPROSYN ) 500 MG tablet Take 1 tablet (500 mg total) by mouth 2 (two) times daily with a meal. 60 tablet 0   nortriptyline (PAMELOR) 10 MG capsule Take 10 mg by mouth 2 (two) times daily.     omeprazole  (PRILOSEC) 20 MG capsule TAKE 1 CAPSULE BY MOUTH DAILY 90 capsule 1   ROCKLATAN 0.02-0.005 % SOLN Apply to eye.     simvastatin  (ZOCOR ) 20 MG tablet Take 1 tablet (20 mg total) by  mouth every evening. 90 tablet 1   triamcinolone  cream (KENALOG ) 0.1 % Apply 1 Application topically 2 (two) times daily. 30 g 0   No current facility-administered medications for this visit.    Allergies  Allergen Reactions   Sulfa Antibiotics Dermatitis   Darvon [Propoxyphene] Hives   Other Other (See Comments) and Rash    PAPER TAPE OK TO USE PAPER TAPE OK TO USE    Tape Rash    PAPER TAPE OK TO USE    Family History  Problem Relation Age of Onset   Breast cancer Sister 21   Heart attack Mother 80   Hyperlipidemia Mother    Hypertension Mother     Social History   Socioeconomic History   Marital status: Married    Spouse name: Not on file   Number of children: Not on file   Years of education: Not on file   Highest education level: Not on file  Occupational History   Not on file  Tobacco Use   Smoking status: Never   Smokeless tobacco: Never  Vaping Use   Vaping status: Never Used  Substance and Sexual Activity   Alcohol use: Not Currently    Comment: rare   Drug use: No   Sexual activity: Yes  Other Topics Concern   Not on file  Social History Narrative   Not on file   Social Drivers of Health   Financial Resource Strain: Low Risk  (09/07/2023)   Overall Financial Resource Strain (CARDIA)    Difficulty of Paying Living Expenses: Not hard at all  Food Insecurity: No Food Insecurity (09/07/2023)   Hunger Vital Sign    Worried About Running Out of Food in the Last Year: Never true    Ran Out of Food in the Last Year: Never true  Transportation Needs: No Transportation Needs (09/07/2023)   PRAPARE - Administrator, Civil Service (Medical): No    Lack of Transportation (Non-Medical): No  Physical Activity: Insufficiently Active (09/07/2023)   Exercise Vital Sign    Days of Exercise per Week: 7 days    Minutes of Exercise per Session: 20 min  Stress: No Stress Concern Present (09/07/2023)   [employer] of Occupational Health -  Occupational Stress Questionnaire    Feeling of Stress : Not at all  Social Connections: Moderately Isolated (09/07/2023)   Social Connection and Isolation Panel    Frequency of Communication with Friends and Family: More than three times a week    Frequency of Social Gatherings with Friends and Family: More than three times a week    Attends Religious Services: More than 4 times per year    Active Member of [employer] or Organizations: No    Attends [employer]  or Organization Meetings: Never    Marital Status: Separated  Intimate Partner Violence: Not At Risk (09/07/2023)   Humiliation, Afraid, Rape, and Kick questionnaire    Fear of Current or Ex-Partner: No    Emotionally Abused: No    Physically Abused: No    Sexually Abused: No    ROS:  Constitutional: Patient reports daily headaches.  Denies fever, malaise, fatigue, or abrupt weight changes.  HEENT: Denies eye pain, eye redness, ear pain, ringing in the ears, wax buildup, runny nose, nasal congestion, bloody nose, or sore throat. Respiratory: Denies difficulty breathing, shortness of breath, cough or sputum production.   Cardiovascular: Denies chest pain, chest tightness, palpitations or swelling in the hands or feet.  Gastrointestinal: Patient reports reflux.  Denies abdominal pain, bloating, constipation, diarrhea or blood in the stool.  GU: Denies frequency, urgency, pain with urination, blood in urine, odor or discharge. Musculoskeletal: Patient reports chronic low back pain.  Denies decrease in range of motion, difficulty with gait, muscle pain or joint swelling.  Skin: Denies redness, rashes, lesions or ulcercations.  Neurological: Patient reports neuropathic pain.  Denies dizziness, difficulty with memory, difficulty with speech or problems with balance and coordination.  Psych: Denies anxiety, depression, SI/HI.  No other specific complaints in a complete review of systems (except as listed in HPI above).  PE: BP 112/72 (BP  Location: Left Arm, Patient Position: Sitting, Cuff Size: Normal)   Ht 5' 6 (1.676 m)   Wt 179 lb 6.4 oz (81.4 kg)   BMI 28.96 kg/m     Wt Readings from Last 3 Encounters:  11/26/23 183 lb 9.6 oz (83.3 kg)  11/08/23 184 lb 6.4 oz (83.6 kg)  09/07/23 187 lb (84.8 kg)    General: Appears her stated age, obese, in NAD. Cardiovascular: Normal rate and rhythm. S1,S2 noted.  No murmur, rubs or gallops noted. No JVD or BLE edema. No carotid bruits noted. Pulmonary/Chest: Normal effort and positive vesicular breath sounds. No respiratory distress. No wheezes, rales or ronchi noted.  Abdomen:  Normal bowel sounds. Musculoskeletal: Normal flexion of the spine.  Decreased extension and rotation of the spine.  Pain with palpation over the lumbar spine.  Strength 4/5 BLE.  Gait slow and steady without device. Neurological: Alert and oriented.  Sensation intact bilaterally upper and lower extremities.    BMET    Component Value Date/Time   NA 140 11/08/2023 1338   K 4.5 11/08/2023 1338   CL 103 11/08/2023 1338   CO2 31 11/08/2023 1338   GLUCOSE 106 (H) 11/08/2023 1338   BUN 7 11/08/2023 1338   CREATININE 0.79 11/08/2023 1338   CALCIUM 9.3 11/08/2023 1338   GFRNONAA >60 09/18/2018 1030   GFRAA >60 09/18/2018 1030    Lipid Panel     Component Value Date/Time   CHOL 135 05/08/2023 1123   TRIG 103 05/08/2023 1123   HDL 51 05/08/2023 1123   CHOLHDL 2.6 05/08/2023 1123   LDLCALC 65 05/08/2023 1123    CBC    Component Value Date/Time   WBC 7.1 11/08/2023 1338   RBC 4.96 11/08/2023 1338   HGB 10.8 (L) 11/08/2023 1338   HCT 36.4 11/08/2023 1338   PLT 261 11/08/2023 1338   MCV 73.4 (L) 11/08/2023 1338   MCH 21.8 (L) 11/08/2023 1338   MCHC 29.7 (L) 11/08/2023 1338   RDW 15.8 (H) 11/08/2023 1338   LYMPHSABS 2.4 06/05/2018 0608   MONOABS 0.5 06/05/2018 0608   EOSABS 0.1 06/05/2018 9391  BASOSABS 0.1 06/05/2018 0608    Hgb A1C Lab Results  Component Value Date   HGBA1C 6.8  (A) 05/08/2023     Assessment and Plan:  Assessment and Plan    Lumbar spinal stenosis with chronic back pain and polyneuropathy Chronic lumbar spinal stenosis with back pain and polyneuropathy since 2003. Symptoms include stiffness, numbness, tingling, and weakness in legs. Requires cane. Symptoms worse at night. Condition is chronic and lifelong. No back surgery history, but future surgery planned. Home health aid assists with daily activities. - Complete and fax change of status medical form for home health aid services.      RTC in 1 week for your annual exam Angeline Laura, NP

## 2023-12-26 ENCOUNTER — Telehealth: Payer: Self-pay

## 2023-12-26 NOTE — Telephone Encounter (Signed)
 D/C 08/14/23. Requested Prescriptions  Refused Prescriptions Disp Refills   magnesium  oxide (MAG-OX) 400 (240 Mg) MG tablet [Pharmacy Med Name: MAGNESIUM  OXIDE -MG SUPPLEMENT 400] 90 tablet     Sig: TAKE 1 TABLET BY MOUTH DAILY AT 12 NOON     Endocrinology:  Minerals - Magnesium  Supplementation Failed - 12/26/2023 10:56 AM      Failed - Mg Level in normal range and within 360 days    Magnesium   Date Value Ref Range Status  06/29/2021 1.7 1.6 - 2.3 mg/dL Final         Passed - Cr in normal range and within 360 days    Creat  Date Value Ref Range Status  11/08/2023 0.79 0.60 - 1.00 mg/dL Final   Creatinine, Urine  Date Value Ref Range Status  05/08/2023 147 20 - 275 mg/dL Final         Passed - Valid encounter within last 12 months    Recent Outpatient Visits           Yesterday Spinal stenosis, lumbar region, with neurogenic claudication   [place] [facility] [place], Angeline ORN, NP   1 month ago Unintentional weight loss   [facility] [place], Angeline ORN, NP   1 month ago Suprapubic pain   [place] [facility] [place], Angeline ORN, NP   3 months ago Oral ulcer   [place] [facility] [place], [place] W, NP   4 months ago BPPV (benign paroxysmal positional vertigo), bilateral   [facility] [place], Marsa PARAS, [place]

## 2023-12-26 NOTE — Telephone Encounter (Signed)
 Copied from CRM [id number]. Topic: Clinical - Prescription Issue >> Dec 26, 2023  1:45 PM Stacy Cunningham wrote: Refused- Magnesium  Oxide 400 (240 Mg)- Pt wants to know if she has to take this if not she doesn't have to order it

## 2023-12-27 MED ORDER — MAGNESIUM 400 MG PO CAPS: 1.0000 | ORAL_CAPSULE | Freq: Every day | ORAL | 0 refills | Status: DC

## 2023-12-27 NOTE — Telephone Encounter (Signed)
 It may help with headaches, constipation or muscle cramps. If she has any of these, she may want to continue taking it

## 2023-12-27 NOTE — Telephone Encounter (Signed)
 Patient advised and verbalized understanding. Will pick up Rx.

## 2023-12-27 NOTE — Addendum Note (Signed)
 Addended by: ZELIA GAUZE D on: 12/27/2023 09:55 AM   Modules accepted: Orders

## 2024-01-01 ENCOUNTER — Telehealth: Payer: Self-pay

## 2024-01-01 NOTE — Telephone Encounter (Signed)
 I would recommend she make a follow-up appointment to discuss this.  She may need referral to either allergy for allergy testing or GI for further evaluation.

## 2024-01-01 NOTE — Telephone Encounter (Signed)
 Patient called in stating that she continues to have the on and off diarrhea. Symptoms have started back, since Thursday this time. She states the Pepto and imodium aren't working. Is there anything else she can take, or anything further to advise? Thanks!

## 2024-01-01 NOTE — Telephone Encounter (Signed)
 Patient advised and scheduled with Angeline on Friday.

## 2024-01-01 NOTE — Telephone Encounter (Signed)
 Copied from CRM [id number]. Topic: Clinical - Medication Question >> Jan 01, 2024 12:10 PM Carlatta H wrote: Reason for CRM: Please call the patient she would to know if a prescriptions can be called in because the preoviouse medications told to take by PCP are not working

## 2024-01-04 ENCOUNTER — Encounter: Payer: Self-pay | Admitting: Internal Medicine

## 2024-01-04 ENCOUNTER — Ambulatory Visit (INDEPENDENT_AMBULATORY_CARE_PROVIDER_SITE_OTHER): Admitting: Internal Medicine

## 2024-01-04 VITALS — BP 124/72 | Ht 66.0 in | Wt 177.6 lb

## 2024-01-04 DIAGNOSIS — Z7984 Long term (current) use of oral hypoglycemic drugs: Secondary | ICD-10-CM | POA: Diagnosis not present

## 2024-01-04 DIAGNOSIS — R634 Abnormal weight loss: Secondary | ICD-10-CM

## 2024-01-04 DIAGNOSIS — R197 Diarrhea, unspecified: Secondary | ICD-10-CM | POA: Diagnosis not present

## 2024-01-04 DIAGNOSIS — E1169 Type 2 diabetes mellitus with other specified complication: Secondary | ICD-10-CM | POA: Diagnosis not present

## 2024-01-04 DIAGNOSIS — E782 Mixed hyperlipidemia: Secondary | ICD-10-CM | POA: Diagnosis not present

## 2024-01-04 MED ORDER — DIPHENOXYLATE-ATROPINE 2.5-0.025 MG PO TABS: 1.0000 | ORAL_TABLET | Freq: Four times a day (QID) | ORAL | 0 refills | Status: AC | PRN

## 2024-01-04 NOTE — Patient Instructions (Signed)
 Diarrhea, Adult Diarrhea is when you pass loose and sometimes watery poop (stool) often. Diarrhea can make you feel weak and cause you to lose water in your body (get dehydrated). Losing water in your body can cause you to: Feel tired and thirsty. Have a dry mouth. Go pee (urinate) less often. Diarrhea often lasts 2-3 days. It can last longer if it is a sign of something more serious. Be sure to treat your diarrhea as told by your doctor. Follow these instructions at home: Eating and drinking     Follow these instructions as told by your doctor: Take an ORS (oral rehydration solution). This is a drink that helps you replace fluids and minerals your body lost. It is sold at pharmacies and stores. Drink enough fluid to keep your pee (urine) pale yellow. Drink fluids such as: Water. You can also get fluids by sucking on ice chips. Diluted fruit juice. Low-calorie sports drinks. Milk. Avoid drinking fluids that have a lot of sugar or caffeine in them. These include soda, energy drinks, and regular sports drinks. Avoid alcohol. Eat bland, easy-to-digest foods in small amounts as you are able. These foods include: Bananas. Applesauce. Rice. Low-fat (lean) meats. Toast. Crackers. Avoid spicy or fatty foods.  Medicines Take over-the-counter and prescription medicines only as told by your doctor. If you were prescribed antibiotics, take them as told by your doctor. Do not stop taking them even if you start to feel better. General instructions  Wash your hands often using soap and water for 20 seconds. If soap and water are not available, use hand sanitizer. Others in your home should wash their hands as well. Wash your hands: After using the toilet or changing a diaper. Before preparing, cooking, or serving food. While caring for a sick person. While visiting someone in a hospital. Rest at home while you get better. Take a warm bath to help with any burning or pain from having  diarrhea. Watch your condition for any changes. Contact a doctor if: You have a fever. Your diarrhea gets worse. You have new symptoms. You vomit every time you eat or drink. You feel light-headed, dizzy, or you have a headache. You have muscle cramps. You have signs of losing too much water in your body, such as: Dark pee, very little pee, or no pee. Cracked lips. Dry mouth. Sunken eyes. Sleepiness. Weakness. You have bloody or black poop or poop that looks like tar. You have very bad pain, cramping, or bloating in your belly (abdomen). Your skin feels cold and clammy. You feel confused. Get help right away if: You have chest pain. Your heart is beating very quickly. You have trouble breathing or you are breathing very quickly. You feel very weak or you faint. These symptoms may be an emergency. Get help right away. Call 911. Do not wait to see if the symptoms will go away. Do not drive yourself to the hospital. This information is not intended to replace advice given to you by your health care provider. Make sure you discuss any questions you have with your health care provider. Document Revised: 09/27/2021 Document Reviewed: 09/27/2021 Elsevier Patient Education  2024 [employer].

## 2024-01-04 NOTE — Progress Notes (Signed)
 Subjective:    Patient ID: Stacy Cunningham, female    DOB: 03-13-1951, 73 y.o.   MRN: [id number]  HPI   Discussed the use of AI scribe software for clinical note transcription with the patient, who gave verbal consent to proceed.  Stacy Cunningham is a 73 year old female who presents with chronic diarrhea.  She has been experiencing diarrhea since February 2025, with loose stools occurring daily. The frequency varies, sometimes slowing to one or two bowel movements per day, but recently increasing to three or four times daily. The stools are brown and loose, without significant change in color or odor. Occasionally, she notices blood when wiping, attributed to hemorrhoids flaring up. No severe abdominal pain, nausea, or vomiting associated with the diarrhea.  She has experienced unintentional weight loss, dropping from 196.6 pounds in February 2025 to 177.6 pounds currently, a total loss of 19 pounds over six months. She reports having had dental work in the past that affected her ability to eat, but she is currently eating normally. Previous cancer and infection workups, including chest x-ray, mammogram, colon cancer screening, and HIV test, have all returned negative results.  She has a history of diverticulosis, identified during a colonoscopy in February 2024, but no active diverticulitis was noted. She has been on metformin  for a long time, taking 1000 mg in the morning and 500 mg in the afternoon. She also takes aspirin, a one-a-day vitamin, iron, and magnesium  supplements. She has tried Imodium and Pepto Bismol for diarrhea, but neither has been effective.  She suspects a lactose allergy, noting that consuming regular vitamin D milk causes an urgent need to use the bathroom. Mixing milk with water alleviates this issue. She has not undergone formal allergy testing.    Review of Systems    Past Medical History:  Diagnosis Date   Anemia    Borderline diabetes mellitus    Diabetes  mellitus without complication (HCC)    Elevated lipids    GERD (gastroesophageal reflux disease)    Glaucoma    Headache    h/o migraines   Hypertension    Neuropathy    Palpitations    a. 05/2020 Zio: Predominantly sinus rhythm, 80 (60-148).  1 SVT run-4 beats at 148.  Rare PACs and PVCs; b. 04/2021 Zio: Predominantly sinus rhythm, 80 (59-140).  Rare PACs/PVCs.   Panic attack     Current Outpatient Medications  Medication Sig Dispense Refill   aspirin 81 MG EC tablet Take 81 mg by mouth daily.     brimonidine (ALPHAGAN) 0.15 % ophthalmic solution Place 1 drop into both eyes 3 (three) times daily.     butalbital-acetaminophen -caffeine (FIORICET) 50-325-40 MG tablet Take 1 tablet by mouth every 4 (four) hours as needed.     clobetasol ointment (TEMOVATE) 0.05 % Apply 1 Application topically.     clotrimazole -betamethasone  (LOTRISONE ) cream APPLY TOPICALLY TO AFFECTED AREAS 2 TIMES DAILY 45 g 0   dorzolamide-timolol  (COSOPT) 22.3-6.8 MG/ML ophthalmic solution Place 1 drop into both eyes at bedtime.     famotidine  (PEPCID ) 40 MG tablet Take 1 tablet (40 mg total) by mouth daily. 90 tablet 1   ferrous sulfate  325 (65 FE) MG tablet Take 1 tablet (325 mg total) by mouth 2 (two) times daily with a meal. 180 tablet 1   fexofenadine  (ALLEGRA  ALLERGY) 180 MG tablet Take 1 tablet (180 mg total) by mouth daily. 90 tablet 0   fluticasone  (FLONASE ) 50 MCG/ACT nasal spray Place 2 sprays into both nostrils  daily. Use for 4-6 weeks then stop and use seasonally or as needed. 16 g 3   gabapentin  (NEURONTIN ) 800 MG tablet Take one tablet AM, one PM, two QHS 120 tablet 0   hydrochlorothiazide  (HYDRODIURIL ) 25 MG tablet TAKE 1 TABLET BY MOUTH DAILY 90 tablet 1   ibuprofen  (ADVIL ) 600 MG tablet Take by mouth.     lidocaine  (LIDODERM ) 5 % Place 1 patch onto the skin daily. Remove & Discard patch within 12 hours or as directed by MD 90 patch 1   lisinopril  (ZESTRIL ) 2.5 MG tablet TAKE 1 TABLET BY MOUTH DAILY FOR  KIDNEY PROTECTION 90 tablet 1   Magnesium  400 MG CAPS Take 1 capsule by mouth daily at 12 noon. 90 capsule 0   metFORMIN  (GLUCOPHAGE ) 500 MG tablet TAKE 2 TABLETS BY MOUTH DAILY WITH BREAKFAST AND 1 TABLET IN THE EVENING 270 tablet 0   naproxen  (NAPROSYN ) 500 MG tablet Take 1 tablet (500 mg total) by mouth 2 (two) times daily with a meal. 60 tablet 0   nortriptyline (PAMELOR) 10 MG capsule Take 10 mg by mouth 2 (two) times daily.     omeprazole  (PRILOSEC) 20 MG capsule TAKE 1 CAPSULE BY MOUTH DAILY 90 capsule 1   ROCKLATAN 0.02-0.005 % SOLN Apply to eye.     simvastatin  (ZOCOR ) 20 MG tablet Take 1 tablet (20 mg total) by mouth every evening. 90 tablet 1   triamcinolone  cream (KENALOG ) 0.1 % Apply 1 Application topically 2 (two) times daily. 30 g 0   No current facility-administered medications for this visit.    Allergies  Allergen Reactions   Sulfa Antibiotics Dermatitis   Darvon [Propoxyphene] Hives   Other Other (See Comments) and Rash    PAPER TAPE OK TO USE PAPER TAPE OK TO USE    Tape Rash    PAPER TAPE OK TO USE    Family History  Problem Relation Age of Onset   Breast cancer Sister 30   Heart attack Mother 64   Hyperlipidemia Mother    Hypertension Mother     Social History   Socioeconomic History   Marital status: Married    Spouse name: Not on file   Number of children: Not on file   Years of education: Not on file   Highest education level: Not on file  Occupational History   Not on file  Tobacco Use   Smoking status: Never   Smokeless tobacco: Never  Vaping Use   Vaping status: Never Used  Substance and Sexual Activity   Alcohol use: Not Currently    Comment: rare   Drug use: No   Sexual activity: Yes  Other Topics Concern   Not on file  Social History Narrative   Not on file   Social Drivers of Health   Financial Resource Strain: Low Risk  (09/07/2023)   Overall Financial Resource Strain (CARDIA)    Difficulty of Paying Living Expenses: Not  hard at all  Food Insecurity: No Food Insecurity (09/07/2023)   Hunger Vital Sign    Worried About Running Out of Food in the Last Year: Never true    Ran Out of Food in the Last Year: Never true  Transportation Needs: No Transportation Needs (09/07/2023)   PRAPARE - Administrator, Civil Service (Medical): No    Lack of Transportation (Non-Medical): No  Physical Activity: Insufficiently Active (09/07/2023)   Exercise Vital Sign    Days of Exercise per Week: 7 days  Minutes of Exercise per Session: 20 min  Stress: No Stress Concern Present (09/07/2023)   [employer] of Occupational Health - Occupational Stress Questionnaire    Feeling of Stress : Not at all  Social Connections: Moderately Isolated (09/07/2023)   Social Connection and Isolation Panel    Frequency of Communication with Friends and Family: More than three times a week    Frequency of Social Gatherings with Friends and Family: More than three times a week    Attends Religious Services: More than 4 times per year    Active Member of [employer] or Organizations: No    Attends Banker Meetings: Never    Marital Status: Separated  Intimate Partner Violence: Not At Risk (09/07/2023)   Humiliation, Afraid, Rape, and Kick questionnaire    Fear of Current or Ex-Partner: No    Emotionally Abused: No    Physically Abused: No    Sexually Abused: No     Constitutional: Patient reports unintentional weight loss, intermittent headaches, fatigue.  Denies fever, malaise, headache.  Respiratory: Denies difficulty breathing, shortness of breath, cough or sputum production.   Cardiovascular: Denies chest pain, chest tightness, palpitations or swelling in the hands or feet.  Gastrointestinal: Patient reports poor appetite, intermittent diarrhea, intermittent diarrhea.  Denies abdominal pain, bloating, constipation, or blood in the stool.  Musculoskeletal: Patient reports chronic back pain.  Denies decrease in range  of motion, difficulty with gait, muscle pain or joint  swelling.  Skin: Denies redness, rashes, lesions or ulcercations.  Neurological: Denies dizziness, difficulty with memory, difficulty with speech or problems with balance and coordination.    No other specific complaints in a complete review of systems (except as listed in HPI above).      Objective:   Physical Exam  BP 124/72 (BP Location: Left Arm, Patient Position: Sitting, Cuff Size: Normal)   Ht 5' 6 (1.676 m)   Wt 177 lb 9.6 oz (80.6 kg)   BMI 28.67 kg/m     Wt Readings from Last 3 Encounters:  12/25/23 179 lb 6.4 oz (81.4 kg)  11/26/23 183 lb 9.6 oz (83.3 kg)  11/08/23 184 lb 6.4 oz (83.6 kg)    General: Appears her stated age, overweight,  in NAD. Cardiovascular: Normal rate and rhythm.  Pulmonary/Chest: Normal effort and positive vesicular breath sounds. No respiratory distress. No wheezes, rales or ronchi noted.  Abdomen: Soft, nontender. Hyperactive bowel sounds.  Neurological: Alert and oriented.   BMET    Component Value Date/Time   NA 140 11/08/2023 1338   K 4.5 11/08/2023 1338   CL 103 11/08/2023 1338   CO2 31 11/08/2023 1338   GLUCOSE 106 (H) 11/08/2023 1338   BUN 7 11/08/2023 1338   CREATININE 0.79 11/08/2023 1338   CALCIUM 9.3 11/08/2023 1338   GFRNONAA >60 09/18/2018 1030   GFRAA >60 09/18/2018 1030    Lipid Panel     Component Value Date/Time   CHOL 135 05/08/2023 1123   TRIG 103 05/08/2023 1123   HDL 51 05/08/2023 1123   CHOLHDL 2.6 05/08/2023 1123   LDLCALC 65 05/08/2023 1123    CBC    Component Value Date/Time   WBC 7.1 11/08/2023 1338   RBC 4.96 11/08/2023 1338   HGB 10.8 (L) 11/08/2023 1338   HCT 36.4 11/08/2023 1338   PLT 261 11/08/2023 1338   MCV 73.4 (L) 11/08/2023 1338   MCH 21.8 (L) 11/08/2023 1338   MCHC 29.7 (L) 11/08/2023 1338   RDW 15.8 (H) 11/08/2023  1338   LYMPHSABS 2.4 06/05/2018 0608   MONOABS 0.5 06/05/2018 0608   EOSABS 0.1 06/05/2018 0608   BASOSABS  0.1 06/05/2018 0608    Hgb A1C Lab Results  Component Value Date   HGBA1C 6.8 (A) 05/08/2023            Assessment & Plan:      Assessment and Plan    Chronic diarrhea with unintentional weight loss and possible lactose intolerance Chronic diarrhea with weight loss, negative for C diff, GI pathogens, and cancer. Possible lactose intolerance. Metformin  and magnesium  may contribute. Hemorrhoids present. Imodium and Vecamyl ineffective. - Discontinue metformin  and magnesium . - Prescribe Lomotil  2.5-0.025 mg, one tablet up to four times a day as needed for diarrhea. - Re-evaluate symptoms in one week. - If diarrhea persists, refer to GI for further evaluation and possible food allergy testing.  Type 2 diabetes mellitus Type 2 diabetes managed with metformin , now held to assess its role in diarrhea. Blood sugar levels unmonitored since January. - Hold metformin  and monitor blood sugar levels. - Check A1c at next appointment. - Evaluate alternative diabetes medications if blood sugar is elevated.          RTC in 1 weeks for your annual exam Angeline Laura, NP

## 2024-01-09 ENCOUNTER — Other Ambulatory Visit: Payer: Self-pay | Admitting: Podiatry

## 2024-01-09 ENCOUNTER — Other Ambulatory Visit: Payer: Self-pay | Admitting: Internal Medicine

## 2024-01-09 NOTE — Telephone Encounter (Unsigned)
 Copied from CRM #[id number]. Topic: Clinical - Medication Refill >> Jan 09, 2024  1:18 PM Sophia H wrote: Medication: gabapentin  (NEURONTIN ) 800 MG tablet   Has the patient contacted their pharmacy? Yes, told no more refills on file.   This is the patient's preferred pharmacy:  [facility] - [place], [place] - [address] [place] [address] Phone: [phone] Fax: [phone]   Is this the correct pharmacy for this prescription? Yes If no, delete pharmacy and type the correct one.   Has the prescription been filled recently? Yes  Is the patient out of the medication? No, has 4 left.   Has the patient been seen for an appointment in the last year OR does the patient have an upcoming appointment? Yes, has an appt 09/19.  Can we respond through MyChart? Yes but prefers phone.   Agent: Please be advised that Rx refills may take up to 3 business days. We ask that you follow-up with your pharmacy.

## 2024-01-11 ENCOUNTER — Encounter: Payer: Self-pay | Admitting: Internal Medicine

## 2024-01-11 ENCOUNTER — Ambulatory Visit: Admitting: Internal Medicine

## 2024-01-11 VITALS — BP 120/72 | Ht 66.0 in | Wt 179.6 lb

## 2024-01-11 DIAGNOSIS — E1169 Type 2 diabetes mellitus with other specified complication: Secondary | ICD-10-CM | POA: Diagnosis not present

## 2024-01-11 DIAGNOSIS — Z0001 Encounter for general adult medical examination with abnormal findings: Secondary | ICD-10-CM

## 2024-01-11 DIAGNOSIS — E782 Mixed hyperlipidemia: Secondary | ICD-10-CM | POA: Diagnosis not present

## 2024-01-11 DIAGNOSIS — Z23 Encounter for immunization: Secondary | ICD-10-CM

## 2024-01-11 NOTE — Patient Instructions (Signed)
 Health Maintenance for Postmenopausal Women Menopause is a normal process in which your ability to get pregnant comes to an end. This process happens slowly over many months or years, usually between the ages of 76 and 38. Menopause is complete when you have missed your menstrual period for 12 months. It is important to talk with your health care provider about some of the most common conditions that affect women after menopause (postmenopausal women). These include heart disease, cancer, and bone loss (osteoporosis). Adopting a healthy lifestyle and getting preventive care can help to promote your health and wellness. The actions you take can also lower your chances of developing some of these common conditions. What are the signs and symptoms of menopause? During menopause, you may have the following symptoms: Hot flashes. These can be moderate or severe. Night sweats. Decrease in sex drive. Mood swings. Headaches. Tiredness (fatigue). Irritability. Memory problems. Problems falling asleep or staying asleep. Talk with your health care provider about treatment options for your symptoms. Do I need hormone replacement therapy? Hormone replacement therapy is effective in treating symptoms that are caused by menopause, such as hot flashes and night sweats. Hormone replacement carries certain risks, especially as you become older. If you are thinking about using estrogen or estrogen with progestin, discuss the benefits and risks with your health care provider. How can I reduce my risk for heart disease and stroke? The risk of heart disease, heart attack, and stroke increases as you age. One of the causes may be a change in the body's hormones during menopause. This can affect how your body uses dietary fats, triglycerides, and cholesterol. Heart attack and stroke are medical emergencies. There are many things that you can do to help prevent heart disease and stroke. Watch your blood pressure High  blood pressure causes heart disease and increases the risk of stroke. This is more likely to develop in people who have high blood pressure readings or are overweight. Have your blood pressure checked: Every 3-5 years if you are 32-23 years of age. Every year if you are 31 years old or older. Eat a healthy diet  Eat a diet that includes plenty of vegetables, fruits, low-fat dairy products, and lean protein. Do not eat a lot of foods that are high in solid fats, added sugars, or sodium. Get regular exercise Get regular exercise. This is one of the most important things you can do for your health. Most adults should: Try to exercise for at least 150 minutes each week. The exercise should increase your heart rate and make you sweat (moderate-intensity exercise). Try to do strengthening exercises at least twice each week. Do these in addition to the moderate-intensity exercise. Spend less time sitting. Even light physical activity can be beneficial. Other tips Work with your health care provider to achieve or maintain a healthy weight. Do not use any products that contain nicotine or tobacco. These products include cigarettes, chewing tobacco, and vaping devices, such as e-cigarettes. If you need help quitting, ask your health care provider. Know your numbers. Ask your health care provider to check your cholesterol and your blood sugar (glucose). Continue to have your blood tested as directed by your health care provider. Do I need screening for cancer? Depending on your health history and family history, you may need to have cancer screenings at different stages of your life. This may include screening for: Breast cancer. Cervical cancer. Lung cancer. Colorectal cancer. What is my risk for osteoporosis? After menopause, you may be  at increased risk for osteoporosis. Osteoporosis is a condition in which bone destruction happens more quickly than new bone creation. To help prevent osteoporosis or  the bone fractures that can happen because of osteoporosis, you may take the following actions: If you are 24-54 years old, get at least 1,000 mg of calcium and at least 600 international units (IU) of vitamin D  per day. If you are older than age 75 but younger than age 30, get at least 1,200 mg of calcium and at least 600 international units (IU) of vitamin D  per day. If you are older than age 8, get at least 1,200 mg of calcium and at least 800 international units (IU) of vitamin D  per day. Smoking and drinking excessive alcohol increase the risk of osteoporosis. Eat foods that are rich in calcium and vitamin D , and do weight-bearing exercises several times each week as directed by your health care provider. How does menopause affect my mental health? Depression may occur at any age, but it is more common as you become older. Common symptoms of depression include: Feeling depressed. Changes in sleep patterns. Changes in appetite or eating patterns. Feeling an overall lack of motivation or enjoyment of activities that you previously enjoyed. Frequent crying spells. Talk with your health care provider if you think that you are experiencing any of these symptoms. General instructions See your health care provider for regular wellness exams and vaccines. This may include: Scheduling regular health, dental, and eye exams. Getting and maintaining your vaccines. These include: Influenza vaccine. Get this vaccine each year before the flu season begins. Pneumonia vaccine. Shingles vaccine. Tetanus, diphtheria, and pertussis (Tdap) booster vaccine. Your health care provider may also recommend other immunizations. Tell your health care provider if you have ever been abused or do not feel safe at home. Summary Menopause is a normal process in which your ability to get pregnant comes to an end. This condition causes hot flashes, night sweats, decreased interest in sex, mood swings, headaches, or lack  of sleep. Treatment for this condition may include hormone replacement therapy. Take actions to keep yourself healthy, including exercising regularly, eating a healthy diet, watching your weight, and checking your blood pressure and blood sugar levels. Get screened for cancer and depression. Make sure that you are up to date with all your vaccines. This information is not intended to replace advice given to you by your health care provider. Make sure you discuss any questions you have with your health care provider. Document Revised: 08/30/2020 Document Reviewed: 08/30/2020 Elsevier Patient Education  2024 [employer].

## 2024-01-11 NOTE — Assessment & Plan Note (Signed)
 Encourage diet and exercise for weight loss

## 2024-01-11 NOTE — Progress Notes (Signed)
 Subjective:    Patient ID: Stacy Cunningham, female    DOB: 04-Sep-1950, 73 y.o.   MRN: [id number]  HPI  Patient presents to clinic today for her annual exam.  Flu: 04/2023 Tetanus: 11/2018 COVID: X 6 Pneumovax: 11/2022 Prevnar: 01/2016 Shingrix: Never Pap smear: < 5 years ago Mammogram: 11/2023 Bone density: 11/2020 Colon screening: 05/2022 Vision screening: annually Dentist: biannually  Diet: She does eat meat. She does consumes fruits and veggies. She does eat some fried foods. She drinks mostly water, tea. Exercise: Walking   Review of Systems     Past Medical History:  Diagnosis Date   Anemia    Borderline diabetes mellitus    Diabetes mellitus without complication (HCC)    Elevated lipids    GERD (gastroesophageal reflux disease)    Glaucoma    Headache    h/o migraines   Hypertension    Neuropathy    Palpitations    a. 05/2020 Zio: Predominantly sinus rhythm, 80 (60-148).  1 SVT run-4 beats at 148.  Rare PACs and PVCs; b. 04/2021 Zio: Predominantly sinus rhythm, 80 (59-140).  Rare PACs/PVCs.   Panic attack     Current Outpatient Medications  Medication Sig Dispense Refill   aspirin 81 MG EC tablet Take 81 mg by mouth daily.     brimonidine (ALPHAGAN) 0.15 % ophthalmic solution Place 1 drop into both eyes 3 (three) times daily.     butalbital-acetaminophen -caffeine (FIORICET) 50-325-40 MG tablet Take 1 tablet by mouth every 4 (four) hours as needed.     clobetasol ointment (TEMOVATE) 0.05 % Apply 1 Application topically.     clotrimazole -betamethasone  (LOTRISONE ) cream APPLY TOPICALLY TO AFFECTED AREAS 2 TIMES DAILY 45 g 0   diphenoxylate -atropine  (LOMOTIL ) 2.5-0.025 MG tablet Take 1 tablet by mouth 4 (four) times daily as needed for diarrhea or loose stools. 30 tablet 0   dorzolamide-timolol  (COSOPT) 22.3-6.8 MG/ML ophthalmic solution Place 1 drop into both eyes at bedtime.     famotidine  (PEPCID ) 40 MG tablet Take 1 tablet (40 mg total) by mouth daily. 90 tablet 1    ferrous sulfate  325 (65 FE) MG tablet Take 1 tablet (325 mg total) by mouth 2 (two) times daily with a meal. 180 tablet 1   fexofenadine  (ALLEGRA  ALLERGY) 180 MG tablet Take 1 tablet (180 mg total) by mouth daily. 90 tablet 0   fluticasone  (FLONASE ) 50 MCG/ACT nasal spray Place 2 sprays into both nostrils daily. Use for 4-6 weeks then stop and use seasonally or as needed. 16 g 3   gabapentin  (NEURONTIN ) 800 MG tablet TAKE 1 TABLET BY MOUTH EVERY MORNING, 1 TABLET EVERY EVENING, AND 2 TABLETS AT BEDTIME 120 tablet 0   hydrochlorothiazide  (HYDRODIURIL ) 25 MG tablet TAKE 1 TABLET BY MOUTH DAILY 90 tablet 1   ibuprofen  (ADVIL ) 600 MG tablet Take by mouth.     lidocaine  (LIDODERM ) 5 % Place 1 patch onto the skin daily. Remove & Discard patch within 12 hours or as directed by MD 90 patch 1   lisinopril  (ZESTRIL ) 2.5 MG tablet TAKE 1 TABLET BY MOUTH DAILY FOR KIDNEY PROTECTION 90 tablet 1   Magnesium  400 MG CAPS Take 1 capsule by mouth daily at 12 noon. 90 capsule 0   metFORMIN  (GLUCOPHAGE ) 500 MG tablet TAKE 2 TABLETS BY MOUTH DAILY WITH BREAKFAST AND 1 TABLET IN THE EVENING 270 tablet 0   naproxen  (NAPROSYN ) 500 MG tablet Take 1 tablet (500 mg total) by mouth 2 (two) times daily with a meal. 60  tablet 0   nortriptyline (PAMELOR) 10 MG capsule Take 10 mg by mouth 2 (two) times daily.     omeprazole  (PRILOSEC) 20 MG capsule TAKE 1 CAPSULE BY MOUTH DAILY 90 capsule 1   ROCKLATAN 0.02-0.005 % SOLN Apply to eye.     simvastatin  (ZOCOR ) 20 MG tablet Take 1 tablet (20 mg total) by mouth every evening. 90 tablet 1   triamcinolone  cream (KENALOG ) 0.1 % Apply 1 Application topically 2 (two) times daily. 30 g 0   No current facility-administered medications for this visit.    Allergies  Allergen Reactions   Sulfa Antibiotics Dermatitis   Darvon [Propoxyphene] Hives   Other Other (See Comments) and Rash    PAPER TAPE OK TO USE PAPER TAPE OK TO USE    Tape Rash    PAPER TAPE OK TO USE    Family  History  Problem Relation Age of Onset   Breast cancer Sister 67   Heart attack Mother 40   Hyperlipidemia Mother    Hypertension Mother     Social History   Socioeconomic History   Marital status: Married    Spouse name: Not on file   Number of children: Not on file   Years of education: Not on file   Highest education level: Not on file  Occupational History   Not on file  Tobacco Use   Smoking status: Never   Smokeless tobacco: Never  Vaping Use   Vaping status: Never Used  Substance and Sexual Activity   Alcohol use: Not Currently    Comment: rare   Drug use: No   Sexual activity: Yes  Other Topics Concern   Not on file  Social History Narrative   Not on file   Social Drivers of Health   Financial Resource Strain: Low Risk  (09/07/2023)   Overall Financial Resource Strain (CARDIA)    Difficulty of Paying Living Expenses: Not hard at all  Food Insecurity: No Food Insecurity (09/07/2023)   Hunger Vital Sign    Worried About Running Out of Food in the Last Year: Never true    Ran Out of Food in the Last Year: Never true  Transportation Needs: No Transportation Needs (09/07/2023)   PRAPARE - Administrator, Civil Service (Medical): No    Lack of Transportation (Non-Medical): No  Physical Activity: Insufficiently Active (09/07/2023)   Exercise Vital Sign    Days of Exercise per Week: 7 days    Minutes of Exercise per Session: 20 min  Stress: No Stress Concern Present (09/07/2023)   [employer] of Occupational Health - Occupational Stress Questionnaire    Feeling of Stress : Not at all  Social Connections: Moderately Isolated (09/07/2023)   Social Connection and Isolation Panel    Frequency of Communication with Friends and Family: More than three times a week    Frequency of Social Gatherings with Friends and Family: More than three times a week    Attends Religious Services: More than 4 times per year    Active Member of [employer] or Organizations: No     Attends Banker Meetings: Never    Marital Status: Separated  Intimate Partner Violence: Not At Risk (09/07/2023)   Humiliation, Afraid, Rape, and Kick questionnaire    Fear of Current or Ex-Partner: No    Emotionally Abused: No    Physically Abused: No    Sexually Abused: No     Constitutional: Patient reports intermittent headaches.  Denies fever, malaise,  fatigue, or abrupt weight changes.  HEENT: Denies eye pain, eye redness, ear pain, ringing in the ears, wax buildup, runny nose, nasal congestion, bloody nose, or sore throat. Respiratory: Denies difficulty breathing, shortness of breath, cough or sputum production.   Cardiovascular: Denies chest pain, chest tightness, palpitations or swelling in the hands or feet.  Gastrointestinal: Denies abdominal pain, bloating, constipation, diarrhea or blood in the stool.  GU: Denies urgency, frequency, pain with urination, burning sensation, blood in urine, odor or discharge. Musculoskeletal: Patient reports low back pain.  Denies decrease in range of motion, difficulty with gait, muscle pain or joint swelling.  Skin: Denies redness, rashes, lesions or ulcercations.  Neurological: Pt reports paresthesia of lower extremities. Denies dizziness, difficulty with memory, difficulty with speech or problems with balance and coordination.  Psych: Denies anxiety, depression, SI/HI.  No other specific complaints in a complete review of systems (except as listed in HPI above).  Objective:   Physical Exam BP 120/72 (BP Location: Left Arm, Patient Position: Sitting, Cuff Size: Normal)   Ht 5' 6 (1.676 m)   Wt 179 lb 9.6 oz (81.5 kg)   BMI 28.99 kg/m    Wt Readings from Last 3 Encounters:  01/04/24 177 lb 9.6 oz (80.6 kg)  12/25/23 179 lb 6.4 oz (81.4 kg)  11/26/23 183 lb 9.6 oz (83.3 kg)    General: Appears her stated age, overweight, in NAD. Skin: Warm, dry and intact. No ulcerations noted. HEENT: Head: normal shape and size;  Eyes: sclera white, no icterus, conjunctiva pink, PERRLA and EOMs intact;  Neck:  Neck supple, trachea midline.  Multinodular goiter noted. Cardiovascular: Normal rate and rhythm. S1,S2 noted.  No murmur, rubs or gallops noted. No JVD or BLE edema. No carotid bruits noted. Pulmonary/Chest: Normal effort and positive vesicular breath sounds. No respiratory distress. No wheezes, rales or ronchi noted.  Abdomen: Normal bowel sounds.  Musculoskeletal: Strength 5/5 BUE/BLE. No difficulty with gait.  Neurological: Alert and oriented. Cranial nerves II-XII grossly intact. Coordination normal.  Psychiatric: Mood and affect normal. Behavior is normal. Judgment and thought content normal.     BMET    Component Value Date/Time   NA 140 11/08/2023 1338   K 4.5 11/08/2023 1338   CL 103 11/08/2023 1338   CO2 31 11/08/2023 1338   GLUCOSE 106 (H) 11/08/2023 1338   BUN 7 11/08/2023 1338   CREATININE 0.79 11/08/2023 1338   CALCIUM 9.3 11/08/2023 1338   GFRNONAA >60 09/18/2018 1030   GFRAA >60 09/18/2018 1030    Lipid Panel     Component Value Date/Time   CHOL 135 05/08/2023 1123   TRIG 103 05/08/2023 1123   HDL 51 05/08/2023 1123   CHOLHDL 2.6 05/08/2023 1123   LDLCALC 65 05/08/2023 1123    CBC    Component Value Date/Time   WBC 7.1 11/08/2023 1338   RBC 4.96 11/08/2023 1338   HGB 10.8 (L) 11/08/2023 1338   HCT 36.4 11/08/2023 1338   PLT 261 11/08/2023 1338   MCV 73.4 (L) 11/08/2023 1338   MCH 21.8 (L) 11/08/2023 1338   MCHC 29.7 (L) 11/08/2023 1338   RDW 15.8 (H) 11/08/2023 1338   LYMPHSABS 2.4 06/05/2018 0608   MONOABS 0.5 06/05/2018 0608   EOSABS 0.1 06/05/2018 0608   BASOSABS 0.1 06/05/2018 0608    Hgb A1C Lab Results  Component Value Date   HGBA1C 6.8 (A) 05/08/2023            Assessment & Plan:   Preventative  health maintenance:  Flu shot today Tetanus UTD Encouraged her to get her COVID booster Pneumovax and Prevnar UTD Discussed Shingrix vaccine, she  will check coverage with her insurance company and schedule visit if she would like to have this done She no longer needs to screen for cervical cancer Mammogram UTD Bone density UTD Colon screening UTD Encouraged her to consume a balanced diet and exercise regimen Advised her to see an eye doctor and dentist annually We will check CBC, c-Met, lipid, A1c and urine microablumin today  RTC in 6 months, follow-up chronic conditions Angeline Laura, NP

## 2024-01-12 LAB — CBC
HCT: 34.3 % — ABNORMAL LOW (ref 35.0–45.0)
Hemoglobin: 10.6 g/dL — ABNORMAL LOW (ref 11.7–15.5)
MCH: 22.1 pg — ABNORMAL LOW (ref 27.0–33.0)
MCHC: 30.9 g/dL — ABNORMAL LOW (ref 32.0–36.0)
MCV: 71.5 fL — ABNORMAL LOW (ref 80.0–100.0)
MPV: 11.3 fL (ref 7.5–12.5)
Platelets: 283 Thousand/uL (ref 140–400)
RBC: 4.8 Million/uL (ref 3.80–5.10)
RDW: 16.2 % — ABNORMAL HIGH (ref 11.0–15.0)
WBC: 6.9 Thousand/uL (ref 3.8–10.8)

## 2024-01-12 LAB — COMPREHENSIVE METABOLIC PANEL WITH GFR
AG Ratio: 1.8 (calc) (ref 1.0–2.5)
ALT: 18 U/L (ref 6–29)
AST: 18 U/L (ref 10–35)
Albumin: 4.4 g/dL (ref 3.6–5.1)
Alkaline phosphatase (APISO): 84 U/L (ref 37–153)
BUN: 9 mg/dL (ref 7–25)
CO2: 32 mmol/L (ref 20–32)
Calcium: 9.5 mg/dL (ref 8.6–10.4)
Chloride: 103 mmol/L (ref 98–110)
Creat: 0.82 mg/dL (ref 0.60–1.00)
Globulin: 2.4 g/dL (ref 1.9–3.7)
Glucose, Bld: 116 mg/dL — ABNORMAL HIGH (ref 65–99)
Potassium: 4 mmol/L (ref 3.5–5.3)
Sodium: 144 mmol/L (ref 135–146)
Total Bilirubin: 0.3 mg/dL (ref 0.2–1.2)
Total Protein: 6.8 g/dL (ref 6.1–8.1)
eGFR: 77 mL/min/1.73m2 (ref >=60)

## 2024-01-12 LAB — HEMOGLOBIN A1C
Hgb A1c MFr Bld: 6.5 % — ABNORMAL HIGH (ref <5.7)
Mean Plasma Glucose: 140 mg/dL
eAG (mmol/L): 7.7 mmol/L

## 2024-01-12 LAB — LIPID PANEL
Cholesterol: 129 mg/dL (ref <200)
HDL: 54 mg/dL (ref >=50)
LDL Cholesterol (Calc): 58 mg/dL
Non-HDL Cholesterol (Calc): 75 mg/dL (ref <130)
Total CHOL/HDL Ratio: 2.4 (calc) (ref <5.0)
Triglycerides: 87 mg/dL (ref <150)

## 2024-01-12 LAB — MICROALBUMIN / CREATININE URINE RATIO
Creatinine, Urine: 220 mg/dL (ref 20–275)
Microalb Creat Ratio: 5 mg/g{creat} (ref <30)
Microalb, Ur: 1.1 mg/dL

## 2024-01-14 ENCOUNTER — Encounter: Payer: Self-pay | Admitting: Internal Medicine

## 2024-01-14 ENCOUNTER — Ambulatory Visit: Payer: Self-pay | Admitting: Internal Medicine

## 2024-01-15 NOTE — Telephone Encounter (Unsigned)
 Copied from CRM #[id number]. Topic: Clinical - Medical Advice >> Jan 15, 2024  4:38 PM Tobias L wrote: Reason for CRM: Patient inquiring if it is safe for her to not take any diabetic medication as her blood sugar levels are 6.5 which is in the prediabetic range.   Requesting callback: [phone]

## 2024-02-14 ENCOUNTER — Encounter: Payer: Self-pay | Admitting: Internal Medicine

## 2024-02-15 ENCOUNTER — Ambulatory Visit (INDEPENDENT_AMBULATORY_CARE_PROVIDER_SITE_OTHER): Admitting: Internal Medicine

## 2024-02-15 ENCOUNTER — Encounter: Payer: Self-pay | Admitting: Internal Medicine

## 2024-02-15 ENCOUNTER — Other Ambulatory Visit: Payer: Self-pay

## 2024-02-15 VITALS — BP 118/78 | Ht 66.0 in | Wt 181.0 lb

## 2024-02-15 DIAGNOSIS — Z6829 Body mass index (BMI) 29.0-29.9, adult: Secondary | ICD-10-CM | POA: Diagnosis not present

## 2024-02-15 DIAGNOSIS — E663 Overweight: Secondary | ICD-10-CM

## 2024-02-15 DIAGNOSIS — E1169 Type 2 diabetes mellitus with other specified complication: Secondary | ICD-10-CM

## 2024-02-15 DIAGNOSIS — E782 Mixed hyperlipidemia: Secondary | ICD-10-CM

## 2024-02-15 MED ORDER — BLOOD GLUCOSE MONITORING SUPPL DEVI: 1.0000 | 0 refills | Status: AC

## 2024-02-15 MED ORDER — LANCET DEVICE MISC: 1.0000 | 0 refills | Status: AC

## 2024-02-15 MED ORDER — LANCETS MISC: 1.0000 | 0 refills | Status: AC

## 2024-02-15 MED ORDER — BLOOD GLUCOSE TEST VI STRP: 1.0000 | ORAL_STRIP | 0 refills | Status: AC

## 2024-02-15 NOTE — Assessment & Plan Note (Signed)
 Encourage diet and exercise for weight loss

## 2024-02-15 NOTE — Progress Notes (Signed)
 Subjective:    Patient ID: Stacy Cunningham, female    DOB: 1950-07-06, 73 y.o.   MRN: [id number]  HPI  Discussed the use of AI scribe software for clinical note transcription with the patient, who gave verbal consent to proceed.  Stacy Cunningham is a 73 year old female with type 2 diabetes who presents for follow-up on her diabetes management.  She was previously on metformin , which was discontinued due to intermittent diarrhea. Since stopping metformin , the diarrhea has completely resolved. She is currently not taking any medication for diabetes.  Her last hemoglobin A1c was 6.5% on January 11, 2024. Her weight has increased from 177 lbs to 181 lbs since her last visit. She has not been checking her blood sugars at home since her last visit.  She would like an A1c today.  Review of Systems     Past Medical History:  Diagnosis Date   Anemia    Borderline diabetes mellitus    Diabetes mellitus without complication (HCC)    Elevated lipids    GERD (gastroesophageal reflux disease)    Glaucoma    Headache    h/o migraines   Hypertension    Neuropathy    Palpitations    a. 05/2020 Zio: Predominantly sinus rhythm, 80 (60-148).  1 SVT run-4 beats at 148.  Rare PACs and PVCs; b. 04/2021 Zio: Predominantly sinus rhythm, 80 (59-140).  Rare PACs/PVCs.   Panic attack     Current Outpatient Medications  Medication Sig Dispense Refill   aspirin 81 MG EC tablet Take 81 mg by mouth daily.     brimonidine (ALPHAGAN) 0.15 % ophthalmic solution Place 1 drop into both eyes 3 (three) times daily.     butalbital-acetaminophen -caffeine (FIORICET) 50-325-40 MG tablet Take 1 tablet by mouth every 4 (four) hours as needed.     clobetasol ointment (TEMOVATE) 0.05 % Apply 1 Application topically.     clotrimazole -betamethasone  (LOTRISONE ) cream APPLY TOPICALLY TO AFFECTED AREAS 2 TIMES DAILY 45 g 0   diphenoxylate -atropine  (LOMOTIL ) 2.5-0.025 MG tablet Take 1 tablet by mouth 4 (four) times daily as  needed for diarrhea or loose stools. 30 tablet 0   dorzolamide-timolol  (COSOPT) 22.3-6.8 MG/ML ophthalmic solution Place 1 drop into both eyes at bedtime.     famotidine  (PEPCID ) 40 MG tablet Take 1 tablet (40 mg total) by mouth daily. 90 tablet 1   ferrous sulfate  325 (65 FE) MG tablet Take 1 tablet (325 mg total) by mouth 2 (two) times daily with a meal. 180 tablet 1   fexofenadine  (ALLEGRA  ALLERGY) 180 MG tablet Take 1 tablet (180 mg total) by mouth daily. 90 tablet 0   fluticasone  (FLONASE ) 50 MCG/ACT nasal spray Place 2 sprays into both nostrils daily. Use for 4-6 weeks then stop and use seasonally or as needed. 16 g 3   gabapentin  (NEURONTIN ) 800 MG tablet TAKE 1 TABLET BY MOUTH EVERY MORNING, 1 TABLET EVERY EVENING, AND 2 TABLETS AT BEDTIME 120 tablet 0   hydrochlorothiazide  (HYDRODIURIL ) 25 MG tablet TAKE 1 TABLET BY MOUTH DAILY 90 tablet 1   ibuprofen  (ADVIL ) 600 MG tablet Take by mouth.     lidocaine  (LIDODERM ) 5 % Place 1 patch onto the skin daily. Remove & Discard patch within 12 hours or as directed by MD 90 patch 1   lisinopril  (ZESTRIL ) 2.5 MG tablet TAKE 1 TABLET BY MOUTH DAILY FOR KIDNEY PROTECTION 90 tablet 1   metFORMIN  (GLUCOPHAGE ) 500 MG tablet TAKE 2 TABLETS BY MOUTH DAILY WITH BREAKFAST AND  1 TABLET IN THE EVENING (Patient not taking: Reported on 01/11/2024) 270 tablet 0   naproxen  (NAPROSYN ) 500 MG tablet Take 1 tablet (500 mg total) by mouth 2 (two) times daily with a meal. 60 tablet 0   nortriptyline (PAMELOR) 10 MG capsule Take 10 mg by mouth 2 (two) times daily.     omeprazole  (PRILOSEC) 20 MG capsule TAKE 1 CAPSULE BY MOUTH DAILY 90 capsule 1   ROCKLATAN 0.02-0.005 % SOLN Apply to eye.     simvastatin  (ZOCOR ) 20 MG tablet Take 1 tablet (20 mg total) by mouth every evening. 90 tablet 1   triamcinolone  cream (KENALOG ) 0.1 % Apply 1 Application topically 2 (two) times daily. 30 g 0   No current facility-administered medications for this visit.    Allergies  Allergen  Reactions   Sulfa Antibiotics Dermatitis   Darvon [Propoxyphene] Hives   Other Other (See Comments) and Rash    PAPER TAPE OK TO USE PAPER TAPE OK TO USE    Tape Rash    PAPER TAPE OK TO USE    Family History  Problem Relation Age of Onset   Breast cancer Sister 75   Heart attack Mother 41   Hyperlipidemia Mother    Hypertension Mother     Social History   Socioeconomic History   Marital status: Married    Spouse name: Not on file   Number of children: Not on file   Years of education: Not on file   Highest education level: Not on file  Occupational History   Not on file  Tobacco Use   Smoking status: Never   Smokeless tobacco: Never  Vaping Use   Vaping status: Never Used  Substance and Sexual Activity   Alcohol use: Not Currently    Comment: rare   Drug use: No   Sexual activity: Yes  Other Topics Concern   Not on file  Social History Narrative   Not on file   Social Drivers of Health   Financial Resource Strain: Low Risk  (09/07/2023)   Overall Financial Resource Strain (CARDIA)    Difficulty of Paying Living Expenses: Not hard at all  Food Insecurity: No Food Insecurity (09/07/2023)   Hunger Vital Sign    Worried About Running Out of Food in the Last Year: Never true    Ran Out of Food in the Last Year: Never true  Transportation Needs: No Transportation Needs (09/07/2023)   PRAPARE - Administrator, Civil Service (Medical): No    Lack of Transportation (Non-Medical): No  Physical Activity: Insufficiently Active (09/07/2023)   Exercise Vital Sign    Days of Exercise per Week: 7 days    Minutes of Exercise per Session: 20 min  Stress: No Stress Concern Present (09/07/2023)   [employer] of Occupational Health - Occupational Stress Questionnaire    Feeling of Stress : Not at all  Social Connections: Moderately Isolated (09/07/2023)   Social Connection and Isolation Panel    Frequency of Communication with Friends and Family: More than  three times a week    Frequency of Social Gatherings with Friends and Family: More than three times a week    Attends Religious Services: More than 4 times per year    Active Member of [employer] or Organizations: No    Attends Banker Meetings: Never    Marital Status: Separated  Intimate Partner Violence: Not At Risk (09/07/2023)   Humiliation, Afraid, Rape, and Kick questionnaire  Fear of Current or Ex-Partner: No    Emotionally Abused: No    Physically Abused: No    Sexually Abused: No     Constitutional: Patient reports intermittent headaches.  Denies fever, malaise, fatigue, or abrupt weight changes.  HEENT: Denies eye pain, eye redness, ear pain, ringing in the ears, wax buildup, runny nose, nasal congestion, bloody nose, or sore throat. Respiratory: Denies difficulty breathing, shortness of breath, cough or sputum production.   Cardiovascular: Denies chest pain, chest tightness, palpitations or swelling in the hands or feet.  Gastrointestinal: Denies abdominal pain, bloating, constipation, diarrhea or blood in the stool.  GU: Denies urgency, frequency, pain with urination, burning sensation, blood in urine, odor or discharge. Musculoskeletal: Patient reports low back pain.  Denies decrease in range of motion, difficulty with gait, muscle pain or joint swelling.  Skin: Denies redness, rashes, lesions or ulcercations.  Neurological: Pt reports paresthesia of lower extremities. Denies dizziness, difficulty with memory, difficulty with speech or problems with balance and coordination.  Psych: Denies anxiety, depression, SI/HI.  No other specific complaints in a complete review of systems (except as listed in HPI above).  Objective:   Physical Exam BP 118/78 (BP Location: Left Arm, Patient Position: Sitting, Cuff Size: Normal)   Ht 5' 6 (1.676 m)   Wt 181 lb (82.1 kg)   BMI 29.21 kg/m     Wt Readings from Last 3 Encounters:  01/11/24 179 lb 9.6 oz (81.5 kg)   01/04/24 177 lb 9.6 oz (80.6 kg)  12/25/23 179 lb 6.4 oz (81.4 kg)    General: Appears her stated age, overweight, in NAD. Skin: Warm, dry and intact. No ulcerations noted. HEENT: Head: normal shape and size; Eyes: sclera white, no icterus, conjunctiva pink, PERRLA and EOMs intact;  Cardiovascular: Normal rate and rhythm. S1,S2 noted.  No murmur, rubs or gallops noted. No JVD or BLE edema. No carotid bruits noted. Pulmonary/Chest: Normal effort and positive vesicular breath sounds. No respiratory distress. No wheezes, rales or ronchi noted.  Musculoskeletal: No difficulty with gait.  Neurological: Alert and oriented.  Coordination normal.    BMET    Component Value Date/Time   NA 144 01/11/2024 1541   K 4.0 01/11/2024 1541   CL 103 01/11/2024 1541   CO2 32 01/11/2024 1541   GLUCOSE 116 (H) 01/11/2024 1541   BUN 9 01/11/2024 1541   CREATININE 0.82 01/11/2024 1541   CALCIUM 9.5 01/11/2024 1541   GFRNONAA >60 09/18/2018 1030   GFRAA >60 09/18/2018 1030    Lipid Panel     Component Value Date/Time   CHOL 129 01/11/2024 1541   TRIG 87 01/11/2024 1541   HDL 54 01/11/2024 1541   CHOLHDL 2.4 01/11/2024 1541   LDLCALC 58 01/11/2024 1541    CBC    Component Value Date/Time   WBC 6.9 01/11/2024 1541   RBC 4.80 01/11/2024 1541   HGB 10.6 (L) 01/11/2024 1541   HCT 34.3 (L) 01/11/2024 1541   PLT 283 01/11/2024 1541   MCV 71.5 (L) 01/11/2024 1541   MCH 22.1 (L) 01/11/2024 1541   MCHC 30.9 (L) 01/11/2024 1541   RDW 16.2 (H) 01/11/2024 1541   LYMPHSABS 2.4 06/05/2018 0608   MONOABS 0.5 06/05/2018 0608   EOSABS 0.1 06/05/2018 0608   BASOSABS 0.1 06/05/2018 0608    Hgb A1C Lab Results  Component Value Date   HGBA1C 6.5 (H) 01/11/2024            Assessment & Plan:   Assessment  and Plan    Type 2 Diabetes Mellitus Type 2 Diabetes Mellitus with previous A1c of 6.5%. Not on medication due to metformin  side effects. Weight increased from 177 lbs to 181 lbs.  Insurance does not cover continuous glucose monitors. Not monitoring blood glucose at home. - Recheck A1c on December 22nd. - Provide glucometer, test strips, and lancets for home monitoring. - Instruct on checking blood glucose fasting,  two hours postprandial and before bedtime. - Educate on target blood glucose levels - Advise dietary monitoring, focusing on carbohydrates and sugars, and adjust diet based on glucose readings.   RTC in 5 months, follow-up chronic conditions Angeline Laura, NP

## 2024-02-15 NOTE — Patient Instructions (Signed)

## 2024-02-20 ENCOUNTER — Other Ambulatory Visit: Payer: Self-pay | Admitting: Internal Medicine

## 2024-02-20 ENCOUNTER — Other Ambulatory Visit: Payer: Self-pay | Admitting: Podiatry

## 2024-02-20 NOTE — Telephone Encounter (Unsigned)
 Copied from CRM [id number]. Topic: Clinical - Medication Refill >> Feb 20, 2024 12:03 PM Jasmin G wrote: Medication: gabapentin  (NEURONTIN ) 800 MG tablet, lisinopril  (ZESTRIL ) 2.5 MG tablet, hydrochlorothiazide  (HYDRODIURIL ) 25 MG tablet. Pt requested for gabapentin  to be refilled today as she states that she will be out today.  Has the patient contacted their pharmacy? Yes (Agent: If no, request that the patient contact the pharmacy for the refill. If patient does not wish to contact the pharmacy document the reason why and proceed with request.) (Agent: If yes, when and what did the pharmacy advise?)  This is the patient's preferred pharmacy:  [facility] - [place], [place] - [address] [place] [address] Phone: [phone] Fax: [phone]  Is this the correct pharmacy for this prescription? Yes If no, delete pharmacy and type the correct one.   Has the prescription been filled recently? Yes  Is the patient out of the medication? Yes  Has the patient been seen for an appointment in the last year OR does the patient have an upcoming appointment? Yes  Can we respond through MyChart? No  Agent: Please be advised that Rx refills may take up to 3 business days. We ask that you follow-up with your pharmacy.

## 2024-02-21 MED ORDER — LISINOPRIL 2.5 MG PO TABS: 2.5000 mg | ORAL_TABLET | Freq: Every day | ORAL | 1 refills | Status: AC

## 2024-02-21 MED ORDER — HYDROCHLOROTHIAZIDE 25 MG PO TABS: 25.0000 mg | ORAL_TABLET | Freq: Every day | ORAL | 1 refills | Status: AC

## 2024-03-14 ENCOUNTER — Other Ambulatory Visit: Payer: Self-pay | Admitting: Internal Medicine

## 2024-03-14 NOTE — Telephone Encounter (Unsigned)
 Copied from CRM [id number]. Topic: Clinical - Medication Refill >> Mar 14, 2024 11:49 AM Emylou G wrote: Medication: lidocaine  (LIDODERM ) 5 %  Has the patient contacted their pharmacy? Yes (Agent: If no, request that the patient contact the pharmacy for the refill. If patient does not wish to contact the pharmacy document the reason why and proceed with request.) (Agent: If yes, when and what did the pharmacy advise?) said to call back  This is the patient's preferred pharmacy:  [facility] - [place], [place] - [address] [place] [address] Phone: [phone] Fax: [phone]    Is this the correct pharmacy for this prescription? Yes If no, delete pharmacy and type the correct one.   Has the prescription been filled recently? No  Is the patient out of the medication? Yes  Has the patient been seen for an appointment in the last year OR does the patient have an upcoming appointment? Yes  Can we respond through MyChart? Yes  Agent: Please be advised that Rx refills may take up to 3 business days. We ask that you follow-up with your pharmacy.

## 2024-03-16 NOTE — Telephone Encounter (Signed)
 Requested medication (s) are due for refill today - yes  Requested medication (s) are on the active medication list -yes  Future visit scheduled -yes  Last refill: 11/08/23 #90 1RF  Notes to clinic: manual review required- abnormal labs  Requested Prescriptions  Pending Prescriptions Disp Refills   lidocaine  (LIDODERM ) 5 % 90 patch 1    Sig: Place 1 patch onto the skin daily. Remove & Discard patch within 12 hours or as directed by MD     Analgesics:  Topicals Failed - 03/16/2024 10:38 AM      Failed - Manual Review: Labs are only required if the patient has taken medication for more than 8 weeks.      Failed - HGB in normal range and within 360 days    Hemoglobin  Date Value Ref Range Status  01/11/2024 10.6 (L) 11.7 - 15.5 g/dL Final         Failed - HCT in normal range and within 360 days    HCT  Date Value Ref Range Status  01/11/2024 34.3 (L) 35.0 - 45.0 % Final         Passed - PLT in normal range and within 360 days    Platelets  Date Value Ref Range Status  01/11/2024 283 140 - 400 Thousand/uL Final         Passed - Cr in normal range and within 360 days    Creat  Date Value Ref Range Status  01/11/2024 0.82 0.60 - 1.00 mg/dL Final   Creatinine, Urine  Date Value Ref Range Status  01/11/2024 220 20 - 275 mg/dL Final         Passed - eGFR is 30 or above and within 360 days    GFR calc Af Amer  Date Value Ref Range Status  09/18/2018 >60 >60 mL/min Final   GFR calc non Af Amer  Date Value Ref Range Status  09/18/2018 >60 >60 mL/min Final   eGFR  Date Value Ref Range Status  01/11/2024 77 > OR = 60 mL/min/1.75m2 Final         Passed - Patient is not pregnant      Passed - Valid encounter within last 12 months    Recent Outpatient Visits           1 month ago DM type 2 with diabetic mixed hyperlipidemia [facility])   [place] [facility] [place], Angeline ORN, NP   2 months ago Encounter for general adult medical examination with abnormal  findings   [place] [facility] [place], [place] W, NP   2 months ago DM type 2 with diabetic mixed hyperlipidemia [facility])   [place] [facility] [place], [place] W, NP   2 months ago Spinal stenosis, lumbar region, with neurogenic claudication   [place] [facility] [place], Angeline ORN, NP   3 months ago Unintentional weight loss   [place] [facility] [place], [place], NP                 Requested Prescriptions  Pending Prescriptions Disp Refills   lidocaine  (LIDODERM ) 5 % 90 patch 1    Sig: Place 1 patch onto the skin daily. Remove & Discard patch within 12 hours or as directed by MD     Analgesics:  Topicals Failed - 03/16/2024 10:38 AM      Failed - Manual Review: Labs are only required if the patient has taken  medication for more than 8 weeks.      Failed - HGB in normal range and within 360 days    Hemoglobin  Date Value Ref Range Status  01/11/2024 10.6 (L) 11.7 - 15.5 g/dL Final         Failed - HCT in normal range and within 360 days    HCT  Date Value Ref Range Status  01/11/2024 34.3 (L) 35.0 - 45.0 % Final         Passed - PLT in normal range and within 360 days    Platelets  Date Value Ref Range Status  01/11/2024 283 140 - 400 Thousand/uL Final         Passed - Cr in normal range and within 360 days    Creat  Date Value Ref Range Status  01/11/2024 0.82 0.60 - 1.00 mg/dL Final   Creatinine, Urine  Date Value Ref Range Status  01/11/2024 220 20 - 275 mg/dL Final         Passed - eGFR is 30 or above and within 360 days    GFR calc Af Amer  Date Value Ref Range Status  09/18/2018 >60 >60 mL/min Final   GFR calc non Af Amer  Date Value Ref Range Status  09/18/2018 >60 >60 mL/min Final   eGFR  Date Value Ref Range Status  01/11/2024 77 > OR = 60 mL/min/1.70m2 Final         Passed - Patient is not pregnant      Passed - Valid encounter within last 12 months    Recent  Outpatient Visits           1 month ago DM type 2 with diabetic mixed hyperlipidemia [facility])   [place] [facility] [place], Angeline ORN, NP   2 months ago Encounter for general adult medical examination with abnormal findings   [place] [facility] [place], [place], NP   2 months ago DM type 2 with diabetic mixed hyperlipidemia [facility])    [facility] [place], [place] W, NP   2 months ago Spinal stenosis, lumbar region, with neurogenic claudication   [facility], Angeline ORN, NP   3 months ago Unintentional weight loss   [facility] [place], Angeline ORN, [place]

## 2024-03-17 MED ORDER — LIDOCAINE 5 % EX PTCH: 1.0000 | MEDICATED_PATCH | CUTANEOUS | 1 refills | Status: DC

## 2024-03-28 ENCOUNTER — Other Ambulatory Visit: Payer: Self-pay | Admitting: Internal Medicine

## 2024-03-28 ENCOUNTER — Other Ambulatory Visit: Payer: Self-pay | Admitting: Podiatry

## 2024-03-28 NOTE — Telephone Encounter (Signed)
 Copied from CRM [id number]. Topic: Clinical - Medication Refill >> Mar 28, 2024  2:59 PM Rosaria E wrote: Medication: gabapentin  (NEURONTIN ) 800 MG tablet  ferrous sulfate  325 (65 FE) MG tablet  Has the patient contacted their pharmacy? Yes (Agent: If no, request that the patient contact the pharmacy for the refill. If patient does not wish to contact the pharmacy document the reason why and proceed with request.) (Agent: If yes, when and what did the pharmacy advise?)  This is the patient's preferred pharmacy:  [facility] - [place], [place] - [address] [place] [address] Phone: [phone] Fax: [phone]  Is this the correct pharmacy for this prescription? Yes If no, delete pharmacy and type the correct one.   Has the prescription been filled recently? Yes  Is the patient out of the medication? Yes  Has the patient been seen for an appointment in the last year OR does the patient have an upcoming appointment? Yes  Can we respond through MyChart? Yes  Agent: Please be advised that Rx refills may take up to 3 business days. We ask that you follow-up with your pharmacy.

## 2024-03-31 ENCOUNTER — Telehealth: Payer: Self-pay

## 2024-03-31 NOTE — Telephone Encounter (Signed)
 Copied from CRM [id number]. Topic: Clinical - Prescription Issue >> Mar 31, 2024  2:34 PM Wess RAMAN wrote: Reason for CRM: Patient does will not have any gabapentin  (NEURONTIN ) 800 MG tablet after she takes the last one for tonight. She stated if she doesn't get it in time, she will have leg pain, numbness and difficulty walking  Callback #: [phone]  Pharmacy: [facility] - [place], [place] - [address] [place] [address] Phone: [phone] Fax: [phone] Hours: Not open 24 hours

## 2024-04-01 ENCOUNTER — Other Ambulatory Visit: Payer: Self-pay | Admitting: Podiatry

## 2024-04-01 MED ORDER — FERROUS SULFATE 325 (65 FE) MG PO TABS: 325.0000 mg | ORAL_TABLET | Freq: Two times a day (BID) | ORAL | 0 refills | Status: AC

## 2024-04-01 NOTE — Telephone Encounter (Signed)
 Requested medication (s) are due for refill today: yes  Requested medication (s) are on the active medication list: yes  Last refill:  02/20/24  Future visit scheduled: yes  Notes to clinic:  Unable to refill per protocol, last refill by another provider.      Requested Prescriptions  Pending Prescriptions Disp Refills   gabapentin  (NEURONTIN ) 800 MG tablet 120 tablet 0    Sig: TAKE 1 TABLET BY MOUTH EVERY MORNING, 1 TABLET EVERY EVENING, AND 2 TABLETS AT BEDTIME     Neurology: Anticonvulsants - gabapentin  Passed - 04/01/2024  9:07 AM      Passed - Cr in normal range and within 360 days    Creat  Date Value Ref Range Status  01/11/2024 0.82 0.60 - 1.00 mg/dL Final   Creatinine, Urine  Date Value Ref Range Status  01/11/2024 220 20 - 275 mg/dL Final         Passed - Completed PHQ-2 or PHQ-9 in the last 360 days      Passed - Valid encounter within last 12 months    Recent Outpatient Visits           1 month ago DM type 2 with diabetic mixed hyperlipidemia (HCC)   [place] [facility] [place], [place], NP   2 months ago Encounter for general adult medical examination with abnormal findings   [facility] [place], Angeline ORN, NP   2 months ago DM type 2 with diabetic mixed hyperlipidemia [facility])   [facility] [place], Angeline ORN, NP   3 months ago Spinal stenosis, lumbar region, with neurogenic claudication   [facility] [place], Angeline ORN, NP   4 months ago Unintentional weight loss    [facility] [place], Angeline ORN, NP              Signed Prescriptions Disp Refills   ferrous sulfate  325 (65 FE) MG tablet 180 tablet 0    Sig: Take 1 tablet (325 mg total) by mouth 2 (two) times daily with a meal.     Endocrinology:  Minerals - Iron Supplementation Failed - 04/01/2024  9:07 AM      Failed - HGB in normal range and within 360 days    Hemoglobin  Date  Value Ref Range Status  01/11/2024 10.6 (L) 11.7 - 15.5 g/dL Final         Failed - HCT in normal range and within 360 days    HCT  Date Value Ref Range Status  01/11/2024 34.3 (L) 35.0 - 45.0 % Final         Failed - Fe (serum) in normal range and within 360 days    Iron  Date Value Ref Range Status  05/08/2022 79 45 - 160 mcg/dL Final   %SAT  Date Value Ref Range Status  05/08/2022 20 16 - 45 % (calc) Final         Failed - Ferritin in normal range and within 360 days    Ferritin  Date Value Ref Range Status  05/08/2022 83 16 - 288 ng/mL Final         Passed - RBC in normal range and within 360 days    RBC  Date Value Ref Range Status  01/11/2024 4.80 3.80 - 5.10 Million/uL Final         Passed - Valid encounter within last 12 months  Recent Outpatient Visits           1 month ago DM type 2 with diabetic mixed hyperlipidemia [facility])   [place] [facility] [place], Angeline ORN, NP   2 months ago Encounter for general adult medical examination with abnormal findings   [facility] [place], [place] W, NP   2 months ago DM type 2 with diabetic mixed hyperlipidemia [facility])   [place] [facility] [place], Angeline ORN, NP   3 months ago Spinal stenosis, lumbar region, with neurogenic claudication   [place] [facility] [place], Angeline ORN, NP   4 months ago Unintentional weight loss   [facility] [place], Angeline ORN, [place]

## 2024-04-01 NOTE — Telephone Encounter (Signed)
 Patient notified to contact Dr Verta

## 2024-04-01 NOTE — Telephone Encounter (Signed)
 This is prescribed by her podiatrist Dr. Verta.  She needs to reach out to them for refill.

## 2024-04-01 NOTE — Telephone Encounter (Signed)
 Requested Prescriptions  Pending Prescriptions Disp Refills   ferrous sulfate  325 (65 FE) MG tablet 180 tablet 0    Sig: Take 1 tablet (325 mg total) by mouth 2 (two) times daily with a meal.     Endocrinology:  Minerals - Iron Supplementation Failed - 04/01/2024  8:47 AM      Failed - HGB in normal range and within 360 days    Hemoglobin  Date Value Ref Range Status  01/11/2024 10.6 (L) 11.7 - 15.5 g/dL Final         Failed - HCT in normal range and within 360 days    HCT  Date Value Ref Range Status  01/11/2024 34.3 (L) 35.0 - 45.0 % Final         Failed - Fe (serum) in normal range and within 360 days    Iron  Date Value Ref Range Status  05/08/2022 79 45 - 160 mcg/dL Final   %SAT  Date Value Ref Range Status  05/08/2022 20 16 - 45 % (calc) Final         Failed - Ferritin in normal range and within 360 days    Ferritin  Date Value Ref Range Status  05/08/2022 83 16 - 288 ng/mL Final         Passed - RBC in normal range and within 360 days    RBC  Date Value Ref Range Status  01/11/2024 4.80 3.80 - 5.10 Million/uL Final         Passed - Valid encounter within last 12 months    Recent Outpatient Visits           1 month ago DM type 2 with diabetic mixed hyperlipidemia [facility])   [place] [facility] [place], Angeline ORN, NP   2 months ago Encounter for general adult medical examination with abnormal findings   [facility] [facility] [place], [place] W, NP   2 months ago DM type 2 with diabetic mixed hyperlipidemia [facility])   [place] [facility] [place], [place] W, NP   3 months ago Spinal stenosis, lumbar region, with neurogenic claudication   [place] [facility] [place], [place] W, NP   4 months ago Unintentional weight loss   [facility] [place], [place] W, NP               gabapentin  (NEURONTIN ) 800 MG tablet 120 tablet 0    Sig: TAKE 1 TABLET BY MOUTH EVERY MORNING, 1  TABLET EVERY EVENING, AND 2 TABLETS AT BEDTIME     Neurology: Anticonvulsants - gabapentin  Passed - 04/01/2024  8:47 AM      Passed - Cr in normal range and within 360 days    Creat  Date Value Ref Range Status  01/11/2024 0.82 0.60 - 1.00 mg/dL Final   Creatinine, Urine  Date Value Ref Range Status  01/11/2024 220 20 - 275 mg/dL Final         Passed - Completed PHQ-2 or PHQ-9 in the last 360 days      Passed - Valid encounter within last 12 months    Recent Outpatient Visits           1 month ago DM type 2 with diabetic mixed hyperlipidemia [facility])   [facility] [place], Angeline ORN, NP   2 months ago Encounter for general adult medical examination with abnormal findings   [facility]  [facility] [place], [place], NP   2 months ago DM type 2 with diabetic mixed hyperlipidemia [facility])   [facility] [place], Angeline ORN, NP   3 months ago Spinal stenosis, lumbar region, with neurogenic claudication   [facility] [place], Angeline ORN, NP   4 months ago Unintentional weight loss   [facility] [place], Angeline ORN, [place]

## 2024-04-14 ENCOUNTER — Ambulatory Visit

## 2024-04-30 ENCOUNTER — Other Ambulatory Visit: Payer: Self-pay | Admitting: Internal Medicine

## 2024-05-01 NOTE — Telephone Encounter (Signed)
 Requested Prescriptions  Pending Prescriptions Disp Refills   omeprazole  (PRILOSEC) 20 MG capsule [Pharmacy Med Name: OMEPRAZOLE  DR 20 MG CAP] 90 capsule 2    Sig: TAKE 1 CAPSULE BY MOUTH DAILY     Gastroenterology: Proton Pump Inhibitors Passed - 05/01/2024  1:52 PM      Passed - Valid encounter within last 12 months    Recent Outpatient Visits           2 months ago DM type 2 with diabetic mixed hyperlipidemia [facility])   [place] [place], Angeline ORN, NP   3 months ago Encounter for general adult medical examination with abnormal findings   [facility] [place], [place], NP   3 months ago DM type 2 with diabetic mixed hyperlipidemia [facility])   [place] [facility] [place], Angeline ORN, NP   4 months ago Spinal stenosis, lumbar region, with neurogenic claudication   [facility] [place], Angeline ORN, NP   5 months ago Unintentional weight loss   [facility] [place], Angeline ORN, [place]

## 2024-05-22 ENCOUNTER — Other Ambulatory Visit: Payer: Self-pay | Admitting: Internal Medicine

## 2024-05-22 ENCOUNTER — Telehealth: Payer: Self-pay

## 2024-05-22 DIAGNOSIS — G43719 Chronic migraine without aura, intractable, without status migrainosus: Secondary | ICD-10-CM

## 2024-05-22 NOTE — Telephone Encounter (Signed)
 Copied from CRM #[id number]. Topic: Clinical - Medication Refill >> May 22, 2024  9:24 AM Amber H wrote: Medication: lidocaine  (LIDODERM ) 5 %   Has the patient contacted their pharmacy? No refills  (Agent: If no, request that the patient contact the pharmacy for the refill. If patient does not wish to contact the pharmacy document the reason why and proceed with request.) (Agent: If yes, when and what did the pharmacy advise?)  This is the patient's preferred pharmacy:  [facility] - [place], [place] - [address] [place] [address] Phone: [phone] Fax: [phone]   Is this the correct pharmacy for this prescription? No refills  If no, delete pharmacy and type the correct one.   Has the prescription been filled recently? Yes  Is the patient out of the medication? Yes  Has the patient been seen for an appointment in the last year OR does the patient have an upcoming appointment? Yes, 02/15/2024  Can we respond through MyChart? No  Agent: Please be advised that Rx refills may take up to 3 business days. We ask that you follow-up with your pharmacy.

## 2024-05-22 NOTE — Addendum Note (Signed)
 Addended by: ANTONETTE ANGELINE ORN on: 05/22/2024 12:26 PM   Modules accepted: Orders

## 2024-05-22 NOTE — Telephone Encounter (Signed)
 Patient notified referral placed, someone should be contacting her to get her scheduled.

## 2024-05-22 NOTE — Telephone Encounter (Signed)
 Referral placed

## 2024-05-22 NOTE — Telephone Encounter (Signed)
 Copied from CRM [id number]. Topic: Referral - Request for Referral >> May 22, 2024  9:16 AM Jeoffrey H wrote: Did the patient discuss referral with their provider in the last year? Yes- patient was already seeing this provider however due to an update in insurance she needed a referral  (If No - schedule appointment) (If Yes - send message)  Appointment offered? Yes  Type of order/referral and detailed reason for visit: Neurologist  Preference of office, provider, location: Arthea Farrow, MD [address] [place], [place], [place] [address] phone number [phone]  If referral order, have you been seen by this specialty before? Yes (If Yes, this issue or another issue? When? Where?  Can we respond through MyChart? No

## 2024-05-23 MED ORDER — LIDOCAINE 5 % EX PTCH: 1.0000 | MEDICATED_PATCH | CUTANEOUS | 1 refills | Status: AC

## 2024-05-23 NOTE — Telephone Encounter (Signed)
 Requested medication (s) are due for refill today: yes   Requested medication (s) are on the active medication list: yes   Last refill:  03/17/24 #90 1 refills  Future visit scheduled: yes 07/14/24  Notes to clinic:  manual review of labs required . Do you want to refill Rx?     Requested Prescriptions  Pending Prescriptions Disp Refills   lidocaine  (LIDODERM ) 5 % 90 patch 1    Sig: Place 1 patch onto the skin daily. Remove & Discard patch within 12 hours or as directed by MD     Analgesics:  Topicals Failed - 05/23/2024  7:55 AM      Failed - Manual Review: Labs are only required if the patient has taken medication for more than 8 weeks.      Failed - HGB in normal range and within 360 days    Hemoglobin  Date Value Ref Range Status  01/11/2024 10.6 (L) 11.7 - 15.5 g/dL Final         Failed - HCT in normal range and within 360 days    HCT  Date Value Ref Range Status  01/11/2024 34.3 (L) 35.0 - 45.0 % Final         Passed - PLT in normal range and within 360 days    Platelets  Date Value Ref Range Status  01/11/2024 283 140 - 400 Thousand/uL Final         Passed - Cr in normal range and within 360 days    Creat  Date Value Ref Range Status  01/11/2024 0.82 0.60 - 1.00 mg/dL Final   Creatinine, Urine  Date Value Ref Range Status  01/11/2024 220 20 - 275 mg/dL Final         Passed - eGFR is 30 or above and within 360 days    GFR calc Af Amer  Date Value Ref Range Status  09/18/2018 >60 >60 mL/min Final   GFR calc non Af Amer  Date Value Ref Range Status  09/18/2018 >60 >60 mL/min Final   eGFR  Date Value Ref Range Status  01/11/2024 77 > OR = 60 mL/min/1.10m2 Final         Passed - Patient is not pregnant      Passed - Valid encounter within last 12 months    Recent Outpatient Visits           3 months ago DM type 2 with diabetic mixed hyperlipidemia [facility])   [place] [facility] [place], Angeline ORN, NP   4 months ago Encounter for  general adult medical examination with abnormal findings   [place] [facility] [place], [place] W, NP   4 months ago DM type 2 with diabetic mixed hyperlipidemia [facility])   [facility] [place], [place] W, NP   5 months ago Spinal stenosis, lumbar region, with neurogenic claudication   [facility] [place], Angeline ORN, NP   5 months ago Unintentional weight loss   [facility] [place], Angeline ORN, [place]

## 2024-05-29 LAB — OPHTHALMOLOGY REPORT-SCANNED

## 2024-05-30 ENCOUNTER — Encounter: Payer: Self-pay | Admitting: Internal Medicine

## 2024-05-30 ENCOUNTER — Ambulatory Visit: Payer: Self-pay

## 2024-05-30 NOTE — Telephone Encounter (Signed)
 FYI Only or Action Required?: FYI only for provider: appointment scheduled on 06/10/24.  Patient was last seen in primary care on 02/15/2024 by Antonette Angeline ORN, NP.  Called Nurse Triage reporting Blurred Vision.  Symptoms began about a month ago.  Interventions attempted: Nothing.  Symptoms are: stable.  Triage Disposition: See PCP Within 2 Weeks  Patient/caregiver understands and will follow disposition?: Yes    Reason for Disposition  [1] Blurred vision or visual changes AND [2] gradual onset (e.g., weeks, months)  Answer Assessment - Initial Assessment Questions Patient says she was seen by her eye doctor yesterday and was told she's losing vision in her left eye. She says for over a month she's had blurry vision more in the left than right. She says he prescribed a new Rx for glasses. She says she's calling just to see who her endocrinologist is so she can make an appointment to see him. Advised I couldn't find anywhere in the chart where she has seen an endocrinologist. She says he was giving the diabetic medicine before she started seeing Angeline and Angeline told her to stop taking them. Advised last OV note in October noted for her to start taking blood sugars, asked if she has any readings. She says she's not taking them because she can't figure out how to get the needle out of the pen. She mentions symptoms of migraine headaches, no other symptoms. Scheduled first available with PCP to discuss the diabetes and endocrinology referral if one is needed, advised to bring in the diabetes testing kit to the appointment for assistance. She verbalized understanding.   1. DESCRIPTION: How has your vision changed? (e.g., complete vision loss, blurred vision, double vision, floaters, etc.)     Blurred vision in both eyes  2. LOCATION: One or both eyes? If one, ask: Which eye?     Left eye is worse  3. SEVERITY: Can you see anything? If Yes, ask: What can you see? (e.g., fine  print)     Yes  4. ONSET: When did this begin? Did it start suddenly or has this been gradual?     Over a month ago  5. PATTERN: Does this come and go, or has it been constant since it started?     Constant since it started  6. PAIN: Is there any pain in your eye(s)?  (Scale 1-10; or mild, moderate, severe)     0  7. CONTACTS-GLASSES: Do you wear contacts or glasses?     Yes glasses  8. CAUSE: What do you think is causing this visual problem?     Per the eye doctor, losing vision   9. OTHER SYMPTOMS: Do you have any other symptoms? (e.g., confusion, headache, arm or leg weakness, speech problems)     No  Protocols used: Vision Loss or Change-A-AH  Reason for Triage: Vision loss reported at the eye doctor yesterday she says, has questions for nurse/PCP. Wants to know who her endocrinologist is? She has stopped taking diabetic medication. Searched chart but could not find endocrinology.

## 2024-05-30 NOTE — Telephone Encounter (Signed)
 Letter sent to [facility] for notes

## 2024-05-30 NOTE — Telephone Encounter (Signed)
 Spoke to patient, advised her we will get office notes from [facility] prior to visit.

## 2024-05-30 NOTE — Telephone Encounter (Signed)
 Will discuss at upcoming appointment.  Can we get ophthalmology notes prior to her appointment?

## 2024-06-10 ENCOUNTER — Ambulatory Visit: Admitting: Internal Medicine

## 2024-07-14 ENCOUNTER — Ambulatory Visit: Admitting: Internal Medicine

## 2024-09-19 ENCOUNTER — Ambulatory Visit

## 2024-09-24 ENCOUNTER — Ambulatory Visit
